# Patient Record
Sex: Male | Born: 1955 | Race: White | Hispanic: No | Marital: Married | Smoking: Never smoker
Health system: Southern US, Community
[De-identification: ages and names within clinical notes are randomized; demographics above are authoritative.]

## PROBLEM LIST (undated history)

## (undated) DIAGNOSIS — E785 Hyperlipidemia, unspecified: Secondary | ICD-10-CM

## (undated) DIAGNOSIS — R011 Cardiac murmur, unspecified: Secondary | ICD-10-CM

## (undated) DIAGNOSIS — E119 Type 2 diabetes mellitus without complications: Secondary | ICD-10-CM

## (undated) DIAGNOSIS — I10 Essential (primary) hypertension: Secondary | ICD-10-CM

## (undated) DIAGNOSIS — H9319 Tinnitus, unspecified ear: Secondary | ICD-10-CM

## (undated) DIAGNOSIS — G43909 Migraine, unspecified, not intractable, without status migrainosus: Secondary | ICD-10-CM

## (undated) DIAGNOSIS — H269 Unspecified cataract: Secondary | ICD-10-CM

## (undated) DIAGNOSIS — J189 Pneumonia, unspecified organism: Secondary | ICD-10-CM

## (undated) DIAGNOSIS — B029 Zoster without complications: Secondary | ICD-10-CM

## (undated) DIAGNOSIS — I2510 Atherosclerotic heart disease of native coronary artery without angina pectoris: Secondary | ICD-10-CM

## (undated) DIAGNOSIS — R51 Headache: Secondary | ICD-10-CM

## (undated) DIAGNOSIS — M1990 Unspecified osteoarthritis, unspecified site: Secondary | ICD-10-CM

## (undated) DIAGNOSIS — K859 Acute pancreatitis without necrosis or infection, unspecified: Principal | ICD-10-CM

## (undated) DIAGNOSIS — R7303 Prediabetes: Secondary | ICD-10-CM

## (undated) HISTORY — DX: Hyperlipidemia, unspecified: E78.5

## (undated) HISTORY — DX: Unspecified cataract: H26.9

## (undated) HISTORY — PX: COLONOSCOPY: SHX174

## (undated) HISTORY — DX: Type 2 diabetes mellitus without complications: E11.9

## (undated) HISTORY — DX: Zoster without complications: B02.9

---
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
## 2009-08-01 HISTORY — PX: SHOULDER ARTHROSCOPY W/ ROTATOR CUFF REPAIR: SHX2400

## 2009-09-04 ENCOUNTER — Encounter: Admission: RE | Admit: 2009-09-04 | Discharge: 2009-09-04 | Payer: Self-pay | Admitting: Orthopedic Surgery

## 2009-09-14 ENCOUNTER — Encounter: Admission: RE | Admit: 2009-09-14 | Discharge: 2009-09-14 | Payer: Self-pay | Admitting: Orthopedic Surgery

## 2014-02-03 ENCOUNTER — Encounter (HOSPITAL_COMMUNITY): Payer: Self-pay | Admitting: Emergency Medicine

## 2014-02-03 ENCOUNTER — Emergency Department (HOSPITAL_COMMUNITY): Payer: Medicare Other

## 2014-02-03 ENCOUNTER — Inpatient Hospital Stay (HOSPITAL_COMMUNITY)
Admission: EM | Admit: 2014-02-03 | Discharge: 2014-02-06 | DRG: 440 | Disposition: A | Discharge status: Home / Self Care | Payer: Medicare Other | Attending: Oncology | Admitting: Oncology

## 2014-02-03 DIAGNOSIS — K859 Acute pancreatitis without necrosis or infection, unspecified: Principal | ICD-10-CM | POA: Diagnosis present

## 2014-02-03 DIAGNOSIS — Z833 Family history of diabetes mellitus: Secondary | ICD-10-CM

## 2014-02-03 DIAGNOSIS — I10 Essential (primary) hypertension: Secondary | ICD-10-CM

## 2014-02-03 DIAGNOSIS — Z8719 Personal history of other diseases of the digestive system: Secondary | ICD-10-CM

## 2014-02-03 HISTORY — DX: Unspecified osteoarthritis, unspecified site: M19.90

## 2014-02-03 HISTORY — DX: Cardiac murmur, unspecified: R01.1

## 2014-02-03 HISTORY — DX: Headache: R51

## 2014-02-03 HISTORY — DX: Migraine, unspecified, not intractable, without status migrainosus: G43.909

## 2014-02-03 HISTORY — DX: Pneumonia, unspecified organism: J18.9

## 2014-02-03 HISTORY — DX: Essential (primary) hypertension: I10

## 2014-02-03 HISTORY — DX: Acute pancreatitis without necrosis or infection, unspecified: K85.90

## 2014-02-03 LAB — I-STAT TROPONIN, ED: TROPONIN I, POC: 0 ng/mL (ref 0.00–0.08)

## 2014-02-03 LAB — TRIGLYCERIDES: TRIGLYCERIDES: 125 mg/dL (ref <150)

## 2014-02-03 LAB — CBC WITH DIFFERENTIAL/PLATELET
BASOS ABS: 0 10*3/uL (ref 0.0–0.1)
Basophils Relative: 0 % (ref 0–1)
EOS ABS: 0.1 10*3/uL (ref 0.0–0.7)
EOS PCT: 0 % (ref 0–5)
HCT: 43.7 % (ref 39.0–52.0)
HEMOGLOBIN: 15.6 g/dL (ref 13.0–17.0)
LYMPHS PCT: 7 % — AB (ref 12–46)
Lymphs Abs: 1.2 10*3/uL (ref 0.7–4.0)
MCH: 29.7 pg (ref 26.0–34.0)
MCHC: 35.7 g/dL (ref 30.0–36.0)
MCV: 83.2 fL (ref 78.0–100.0)
MONO ABS: 1.1 10*3/uL — AB (ref 0.1–1.0)
Monocytes Relative: 6 % (ref 3–12)
Neutro Abs: 15.1 10*3/uL — ABNORMAL HIGH (ref 1.7–7.7)
Neutrophils Relative %: 87 % — ABNORMAL HIGH (ref 43–77)
PLATELETS: 281 10*3/uL (ref 150–400)
RBC: 5.25 MIL/uL (ref 4.22–5.81)
RDW: 12.2 % (ref 11.5–15.5)
WBC: 17.5 10*3/uL — AB (ref 4.0–10.5)

## 2014-02-03 LAB — URINALYSIS, ROUTINE W REFLEX MICROSCOPIC
BILIRUBIN URINE: NEGATIVE
Glucose, UA: NEGATIVE mg/dL
HGB URINE DIPSTICK: NEGATIVE
KETONES UR: NEGATIVE mg/dL
LEUKOCYTES UA: NEGATIVE
NITRITE: NEGATIVE
Protein, ur: NEGATIVE mg/dL
SPECIFIC GRAVITY, URINE: 1.017 (ref 1.005–1.030)
Urobilinogen, UA: 0.2 mg/dL (ref 0.0–1.0)
pH: 6.5 (ref 5.0–8.0)

## 2014-02-03 LAB — LIPASE, BLOOD: Lipase: 102 U/L — ABNORMAL HIGH (ref 11–59)

## 2014-02-03 LAB — COMPREHENSIVE METABOLIC PANEL
ALBUMIN: 4.4 g/dL (ref 3.5–5.2)
ALT: 27 U/L (ref 0–53)
AST: 26 U/L (ref 0–37)
Alkaline Phosphatase: 74 U/L (ref 39–117)
Anion gap: 19 — ABNORMAL HIGH (ref 5–15)
BILIRUBIN TOTAL: 1 mg/dL (ref 0.3–1.2)
BUN: 16 mg/dL (ref 6–23)
CO2: 22 mEq/L (ref 19–32)
Calcium: 9.9 mg/dL (ref 8.4–10.5)
Chloride: 90 mEq/L — ABNORMAL LOW (ref 96–112)
Creatinine, Ser: 0.88 mg/dL (ref 0.50–1.35)
GFR calc Af Amer: >90 mL/min (ref >90)
GFR calc non Af Amer: >90 mL/min (ref >90)
GLUCOSE: 130 mg/dL — AB (ref 70–99)
POTASSIUM: 3.9 meq/L (ref 3.7–5.3)
SODIUM: 131 meq/L — AB (ref 137–147)
Total Protein: 8.6 g/dL — ABNORMAL HIGH (ref 6.0–8.3)

## 2014-02-03 LAB — HIV ANTIBODY (ROUTINE TESTING W REFLEX): HIV 1&2 Ab, 4th Generation: NONREACTIVE

## 2014-02-03 LAB — RAPID URINE DRUG SCREEN, HOSP PERFORMED
Amphetamines: NOT DETECTED
BARBITURATES: NOT DETECTED
BENZODIAZEPINES: NOT DETECTED
COCAINE: NOT DETECTED
Opiates: POSITIVE — AB
Tetrahydrocannabinol: NOT DETECTED

## 2014-02-03 MED ORDER — MORPHINE SULFATE 4 MG/ML IJ SOLN
4.0000 mg | Freq: Once | INTRAMUSCULAR | Status: AC
  Administered 2014-02-03: 4 mg via INTRAVENOUS
  Filled 2014-02-03: qty 1

## 2014-02-03 MED ORDER — SODIUM CHLORIDE 0.9 % IV SOLN
1000.0000 mL | INTRAVENOUS | Status: DC
  Administered 2014-02-03: 1000 mL via INTRAVENOUS

## 2014-02-03 MED ORDER — IOHEXOL 300 MG/ML  SOLN
25.0000 mL | INTRAMUSCULAR | Status: AC
  Administered 2014-02-03: 25 mL via ORAL

## 2014-02-03 MED ORDER — ONDANSETRON HCL 4 MG/2ML IJ SOLN
4.0000 mg | Freq: Four times a day (QID) | INTRAMUSCULAR | Status: DC | PRN
Start: 2014-02-03 — End: 2014-02-06
  Administered 2014-02-03: 4 mg via INTRAVENOUS
  Filled 2014-02-03: qty 2

## 2014-02-03 MED ORDER — ONDANSETRON HCL 4 MG/2ML IJ SOLN
4.0000 mg | Freq: Once | INTRAMUSCULAR | Status: AC
  Administered 2014-02-03: 4 mg via INTRAVENOUS
  Filled 2014-02-03: qty 2

## 2014-02-03 MED ORDER — MORPHINE SULFATE 2 MG/ML IJ SOLN
2.0000 mg | INTRAMUSCULAR | Status: DC | PRN
  Administered 2014-02-03 – 2014-02-04 (×5): 2 mg via INTRAVENOUS
  Filled 2014-02-03 (×5): qty 1

## 2014-02-03 MED ORDER — ONDANSETRON HCL 4 MG PO TABS: 4.0000 mg | ORAL_TABLET | Freq: Four times a day (QID) | ORAL | Status: DC | PRN

## 2014-02-03 MED ORDER — HYDRALAZINE HCL 20 MG/ML IJ SOLN
5.0000 mg | INTRAMUSCULAR | Status: DC | PRN
  Filled 2014-02-03: qty 1

## 2014-02-03 MED ORDER — HEPARIN SODIUM (PORCINE) 5000 UNIT/ML IJ SOLN
5000.0000 [IU] | Freq: Three times a day (TID) | INTRAMUSCULAR | Status: DC
  Administered 2014-02-03 – 2014-02-06 (×9): 5000 [IU] via SUBCUTANEOUS
  Filled 2014-02-03 (×11): qty 1

## 2014-02-03 MED ORDER — SODIUM CHLORIDE 0.9 % IJ SOLN
3.0000 mL | Freq: Two times a day (BID) | INTRAMUSCULAR | Status: DC
Start: 2014-02-03 — End: 2014-02-06
  Administered 2014-02-03: 3 mL via INTRAVENOUS

## 2014-02-03 MED ORDER — SODIUM CHLORIDE 0.9 % IV BOLUS (SEPSIS)
1000.0000 mL | Freq: Once | INTRAVENOUS | Status: AC
  Administered 2014-02-03: 1000 mL via INTRAVENOUS

## 2014-02-03 MED ORDER — SODIUM CHLORIDE 0.9 % IV SOLN
INTRAVENOUS | Status: DC
  Administered 2014-02-04 – 2014-02-05 (×6): via INTRAVENOUS

## 2014-02-03 MED ORDER — IOHEXOL 300 MG/ML  SOLN
80.0000 mL | Freq: Once | INTRAMUSCULAR | Status: AC | PRN
  Administered 2014-02-03: 80 mL via INTRAVENOUS

## 2014-02-03 MED ORDER — SODIUM CHLORIDE 0.9 % IV SOLN
1000.0000 mL | Freq: Once | INTRAVENOUS | Status: AC
  Administered 2014-02-03: 1000 mL via INTRAVENOUS

## 2014-02-03 MED ORDER — HYDROMORPHONE HCL PF 1 MG/ML IJ SOLN
1.0000 mg | Freq: Once | INTRAMUSCULAR | Status: AC
  Administered 2014-02-03: 1 mg via INTRAVENOUS
  Filled 2014-02-03: qty 1

## 2014-02-03 NOTE — ED Notes (Signed)
Pt returned from xray

## 2014-02-03 NOTE — H&P (Signed)
Date: 02/03/2014               Patient Name:  David MayerLawrence Cantu MRN: [id number]  DOB: 06/07/1956 Age / Sex: 58 y.o., male   PCP: No Pcp Per Patient         Medical Service: Internal Medicine Teaching Service         Attending Physician: Dr. Levert FeinsteinJames M Granfortuna, MD    First Contact: Dr. Rich Numberarly Rivet, MD Pager: [phone]  Second Contact: Dr. Christen BameNora Sadek Pager: [phone]       After Hours (After 5p/  First Contact Pager: [phone]  weekends / holidays): Second Contact Pager: [phone]   Chief Complaint: Abdominal Pain  History of Present Illness: David Cantu is a 58yo man with PMHx of SBO 1 year ago and untreated HTN who presents with abdominal pain for 2 days. He states his pain is located in the middle, "under the rib cage", and has progressively worsened over the past few days. He describes the pain as a 9/10 in severity on admission, sharp, does not change with position, and has not relationship to eating. He reports sweats, nausea with no vomiting, and constipation. He denies fever, CP, SOB, diarrhea, hematochezia, and melena. He reports his last BM was this AM and was normal.   A CT abdomen was performed in the ER which revealed indistinct margins of the pancreatic head and minimal adjacent peripancreatic infiltration likely due to focal pancreatitis. Lipase was elevated at 101.   Meds: Current Facility-Administered Medications  Medication Dose Route Frequency Provider Last Rate Last Dose  . 0.9 %  sodium chloride infusion  1,000 mL Intravenous Continuous Imagene ShellerSteve Walton, MD 125 mL/hr at 02/03/14 0947 1,000 mL at 02/03/14 0947  . ondansetron (ZOFRAN) tablet 4 mg  4 mg Oral Q6H PRN Christen BameNora Sadek, MD       Or  . ondansetron [facility](ZOFRAN) injection 4 mg  4 mg Intravenous Q6H PRN Christen BameNora Sadek, MD       Current Outpatient Prescriptions  Medication Sig Dispense Refill  . hydrocortisone cream 1 % Apply 1 application topically 2 (two) times daily as needed for itching.      . naproxen sodium (ANAPROX) 220  MG tablet Take 440 mg by mouth daily as needed (for pain).        Allergies: Allergies as of 02/03/2014  . (No Known Allergies)   Past Medical History  Diagnosis Date  . Hypertension    Past Surgical History  Procedure Laterality Date  . Rotator cuff repair     No family history on file. History   Social History  . Marital Status: Married    Spouse Name: N/A    Number of Children: N/A  . Years of Education: N/A   Occupational History  . Not on file.   Social History Main Topics  . Smoking status: Not on file  . Smokeless tobacco: Not on file  . Alcohol Use: Not on file  . Drug Use: Not on file  . Sexual Activity: Not on file   Other Topics Concern  . Not on file   Social History Narrative  . No narrative on file    Review of Systems: General: Denies changes in weight, fatigue, changes in sleep. HEENT: Denies headache, eye pain, ear pain, rhinorrhea, sore throat.  CV: Denies palpitations, orthopnea. Pulm: Denies wheezing, cough.  GI: Denies hematemesis, difficulty swallowing, pain with swallowing. GU: Denies dysuria, frequency, hematuria. Neuro: Reports tingling in right arm occasionally.  Musculoskeletal: Denies muscle  cramps, weakness.    Physical Exam: Blood pressure 169/94, pulse 72, temperature 98.3 F (36.8 C), temperature source Oral, resp. rate 16, SpO2 92.00%. General: pleasant man in NAD, alert, cooperative, laying on stretcher HEENT: [place]/AT, PERRL, EOMI, sclera anicteric, moist mucus membranes Neck: supple, no JVD CV: RRR, normal S1/S2, no m/g/r Pulm: CTA bilaterally, breaths nonlabored GI: BS+, nondistended, mild tenderness in epigastrium, no hepatosplenomegaly, Murphy sign neg Ext: warm, no edema, moves all, healing rash on backside of left knee   Lab results: Basic Metabolic Panel:  Recent Labs  16/10/96 0755  NA 131*  K 3.9  CL 90*  CO2 22  GLUCOSE 130*  BUN 16  CREATININE 0.88  CALCIUM 9.9   Liver Function Tests:  Recent  Labs  02/03/14 0755  AST 26  ALT 27  ALKPHOS 74  BILITOT 1.0  PROT 8.6*  ALBUMIN 4.4    Recent Labs  02/03/14 0755  LIPASE 102*   No results found for this basename: AMMONIA,  in the last 72 hours CBC:  Recent Labs  02/03/14 0755  WBC 17.5*  NEUTROABS 15.1*  HGB 15.6  HCT 43.7  MCV 83.2  PLT 281   Cardiac Enzymes: No results found for this basename: CKTOTAL, CKMB, CKMBINDEX, TROPONINI,  in the last 72 hours BNP: No results found for this basename: PROBNP,  in the last 72 hours D-Dimer: No results found for this basename: DDIMER,  in the last 72 hours CBG: No results found for this basename: GLUCAP,  in the last 72 hours Hemoglobin A1C: No results found for this basename: HGBA1C,  in the last 72 hours Fasting Lipid Panel: No results found for this basename: CHOL, HDL, LDLCALC, TRIG, CHOLHDL, LDLDIRECT,  in the last 72 hours Thyroid Function Tests: No results found for this basename: TSH, T4TOTAL, FREET4, T3FREE, THYROIDAB,  in the last 72 hours Anemia Panel: No results found for this basename: VITAMINB12, FOLATE, FERRITIN, TIBC, IRON, RETICCTPCT,  in the last 72 hours Coagulation: No results found for this basename: LABPROT, INR,  in the last 72 hours Urine Drug Screen: Drugs of Abuse     Component Value Date/Time   LABOPIA POSITIVE* 02/03/2014 0905   COCAINSCRNUR NONE DETECTED 02/03/2014 0905   LABBENZ NONE DETECTED 02/03/2014 0905   AMPHETMU NONE DETECTED 02/03/2014 0905   THCU NONE DETECTED 02/03/2014 0905   LABBARB NONE DETECTED 02/03/2014 0905    Alcohol Level: No results found for this basename: ETH,  in the last 72 hours Urinalysis:  Recent Labs  02/03/14 0905  COLORURINE YELLOW  LABSPEC 1.017  PHURINE 6.5  GLUCOSEU NEGATIVE  HGBUR NEGATIVE  BILIRUBINUR NEGATIVE  KETONESUR NEGATIVE  PROTEINUR NEGATIVE  UROBILINOGEN 0.2  NITRITE NEGATIVE  LEUKOCYTESUR NEGATIVE    Imaging results:  Ct Abdomen Pelvis W Contrast  02/03/2014   CLINICAL DATA:   Fatigue in for 2 days, mid sternal chest pain, abdominal pain including stabbing pain in lower quadrants, right-side tenderness, nausea, dry heaves, history hypertension  EXAM: CT ABDOMEN AND PELVIS WITH CONTRAST  TECHNIQUE: Multidetector CT imaging of the abdomen and pelvis was performed using the standard protocol following bolus administration of intravenous contrast. Sagittal and coronal MPR images reconstructed from axial data set.  CONTRAST:  80mL OMNIPAQUE IOHEXOL 300 MG/ML SOLN IV. Dilute oral contrast.  COMPARISON:  None  FINDINGS: Dependent atelectasis at both lung bases.  Mild fatty infiltration of liver.  Probable cyst at lateral segment LEFT lobe liver 15 x 18 mm image 13.  Remainder of liver,  spleen, kidneys, and adrenal glands normal appearance.  Mildly heterogeneous appearance of the pancreatic head with indistinct margins and minimal adjacent peripancreatic infiltration favoring focal pancreatitis.  Remainder of pancreas normal in appearance without calcification, mass or ductal dilatation.  No pancreatitis associated fluid collections.  Normal appendix.  Stomach and bowel loops normal appearance.  No mass, adenopathy, free fluid or if free air.  Unremarkable bladder in ureters.  No hernia or acute bone lesions.  IMPRESSION: Suspected acute focal pancreatitis involving the pancreatic head as above.  Mild fatty infiltration of liver with probable small cyst at lateral segment LEFT lobe 15 x 18 mm.   Electronically Signed   By: Ulyses SouthwardMark  Boles M.D.   On: 02/03/2014 11:31    Other results: EKG: normal EKG, normal sinus rhythm, there are no previous tracings available for comparison.  Assessment & Plan: David Cantu is a 58yo man with PMHx of SBO 1 year ago and untreated HTN who presented with 2 days of abdominal pain most likely due to acute pancreatitis.   1. Acute Pancreatitis: Patient presented with 2 days of progressively worsening abdominal pain and nausea. CT abdomen in ER showed fat  stranding and peripancreatic infiltration consistent with acute focal pancreatitis. Lipase was 101.  Likely 2/2 to recent steroid use (prescribed in last week for rash on back of left knee) vs. Hypertriglyceridemia vs. Alcohol (denies recent alcohol use) vs. Gallstones (denies pain with eating, no Murphy sign).  - NPO - IVF NS - Zofran PRN nausea - f/u triglycerides - Morphine 2mg  Q3H PRN pain  2. HTN: Pt states that he was diagnosed with HTN recently but is not on medication and does not have a PCP. BP 169/94 on admission.  - Hydralazine PRN SBP >180, DBP >110 - Continue to monitor BP  3. Possible diabetes mellitus type 2: Patient is not being followed by PCP. Blood sugar in ER was 130. Patient has family history of diabetes.   - Ordered HbgA1c   Diet: NPO DVT PPx: Heparin [place] Dispo: Disposition is deferred at this time, awaiting improvement of current medical problems. Anticipated discharge in approximately 1-2 day(s).   The patient does not have a current PCP (No Pcp Per Patient) and [facility].  The patient does not have transportation limitations that hinder transportation to clinic appointments.  Signed: Rich Numberarly Rivet, MD 02/03/2014, 2:14 PM

## 2014-02-03 NOTE — ED Notes (Signed)
Pt updated on

## 2014-02-03 NOTE — Progress Notes (Signed)
Called ED for report  

## 2014-02-03 NOTE — ED Notes (Signed)
Apresoline not given at this time. BP 140/71.

## 2014-02-03 NOTE — ED Notes (Signed)
Ct called regarding continue delay; pt updated.

## 2014-02-03 NOTE — ED Provider Notes (Signed)
I saw and evaluated the patient, reviewed the resident's note and I agree with the findings and plan.   EKG Interpretation   Date/Time:  Monday February 03 2014 07:52:07 EDT Ventricular Rate:  87 PR Interval:  108 QRS Duration: 93 QT Interval:  378 QTC Calculation: 455 R Axis:   74 Text Interpretation:  Age not entered, assumed to be  58 years old for  purpose of ECG interpretation Sinus rhythm Short PR interval Confirmed by  Loeta Herst,  DO, Nafisah Runions [phone]([id number]) on 02/03/2014 8:20:44 AM      Pt is a 58 y.o. M with history of hypertension who presents emergency department with 2 days of diffuse abdominal pain that started in his epigastric region, nausea and dry heaving. No fevers, chills, vomiting or diarrhea, bloody stool or melena, dysuria or hematuria. On exam, patient is diffusely tender to palpation without focality, no peritoneal signs. He is nontoxic appearing, hemodynamically stable.  Patient's labs and CT scan show acute pancreatitis. Discussed with patient he reports he would like admission for pain control. Will discuss with medicine.  Layla MawKristen N Hiliary Osorto, DO 02/03/14 1300

## 2014-02-03 NOTE — Progress Notes (Signed)
NURSING PROGRESS NOTE  David Cantu [id number] Admission Data: 02/03/2014 3:38 PM Attending Provider: Levert FeinsteinJames M Granfortuna, MD PCP:No PCP Per Patient Code Status: full   David MayerLawrence Cantu is a 58 y.o. male patient admitted from ED:  -No acute distress noted.  -No complaints of shortness of breath.  -No complaints of chest pain.   Cardiac Monitoring: Box # 17 in place. Cardiac monitor yields:normal sinus rhythm.  Blood pressure 157/81, pulse 77, temperature 98.4 F (36.9 C), temperature source Oral, resp. rate 20, SpO2 94.00%.   IV Fluids:  IV in place, occlusive dsg intact without redness, IV cath hand right, condition patent and no redness normal saline @ 125    Allergies:  Review of patient's allergies indicates no known allergies.  Past Medical History:   has a past medical history of Hypertension and Acute pancreatitis (02/03/2014).  Past Surgical History:   has past surgical history that includes Shoulder arthroscopy w/ rotator cuff repair (Left, 2011).  Social History:     Skin: warm dry intact   Patient/Family orientated to room. Information packet given to patient/family. Admission inpatient armband information verified with patient/family to include name and date of birth and placed on patient arm. Side rails up x 2, fall assessment and education completed with patient/family. Patient/family able to verbalize understanding of risk associated with falls and verbalized understanding to call for assistance before getting out of bed. Call light within reach. Patient/family able to voice and demonstrate understanding of unit orientation instructions.    Will continue to evaluate and treat per MD orders.

## 2014-02-03 NOTE — ED Notes (Signed)
CT called regarding delay; pt informed he is next.

## 2014-02-03 NOTE — ED Notes (Addendum)
2 days feeling fatigue and midsternal chest pain. Pain began to migrate to belly. R side is tender. Pt is having nausea and dry heaves but denies vomiting. Pt reports that he thought he was constipated and took laxative. No diarr. Pt states that he has HTN but does not take meds; mother died this past weekend. Pt reports pain is stabbing diffuse to lower quandrants. Pt is diaphoretic.

## 2014-02-03 NOTE — ED Notes (Signed)
CT notified pt done with contrast. 

## 2014-02-03 NOTE — ED Notes (Signed)
Urinal given and pt asked for sample.

## 2014-02-03 NOTE — ED Provider Notes (Signed)
CSN: [id number]     Arrival date & time 02/03/14  0744 History   First MD Initiated Contact with Patient 02/03/14 [phone]     Chief Complaint  Patient presents with  . Abdominal Pain     (Consider location/radiation/quality/duration/timing/severity/associated sxs/prior Treatment) Patient is a 58 y.o. male presenting with abdominal pain.  Abdominal Pain Pain location:  Generalized Pain quality: cramping   Pain radiates to:  Does not radiate Pain severity:  Severe Onset quality:  Gradual Duration:  4 days Timing:  Constant Progression:  Worsening Chronicity:  New Relieved by:  None tried Associated symptoms: nausea and vomiting   Associated symptoms: no chest pain, no chills, no cough, no dysuria, no fever, no hematemesis, no hematuria, no shortness of breath and no sore throat     Past Medical History  Diagnosis Date  . Hypertension    Past Surgical History  Procedure Laterality Date  . Rotator cuff repair     No family history on file. History  Substance Use Topics  . Smoking status: Not on file  . Smokeless tobacco: Not on file  . Alcohol Use: Not on file    Review of Systems  Constitutional: Negative for fever and chills.  HENT: Negative for sore throat.   Eyes: Negative for pain.  Respiratory: Negative for cough and shortness of breath.   Cardiovascular: Negative for chest pain.  Gastrointestinal: Positive for nausea, vomiting and abdominal pain. Negative for hematemesis.  Genitourinary: Negative for dysuria, hematuria, flank pain, penile swelling and scrotal swelling.  Musculoskeletal: Negative for back pain and neck pain.  Skin: Negative for rash.  Neurological: Negative for seizures and headaches.      Allergies  Review of patient's allergies indicates no known allergies.  Home Medications   Prior to Admission medications   Medication Sig Start Date End Date Taking? Authorizing Provider  hydrocortisone cream 1 % Apply 1 application topically 2 (two)  times daily as needed for itching.   Yes Historical Provider, MD  naproxen sodium (ANAPROX) 220 MG tablet Take 440 mg by mouth daily as needed (for pain).   Yes Historical Provider, MD   BP 157/81  Pulse 71  Temp(Src) 98.3 F (36.8 C) (Oral)  Resp 23  SpO2 91% Physical Exam  Constitutional: He is oriented to person, place, and time. He appears well-developed and well-nourished. No distress.  HENT:  Head: Normocephalic and atraumatic.  Eyes: Pupils are equal, round, and reactive to light.  Neck: Normal range of motion.  Cardiovascular: Normal rate and regular rhythm.   Pulmonary/Chest: Effort normal and breath sounds normal.  Abdominal: Soft. He exhibits no distension. There is generalized tenderness. There is no rigidity, no rebound, no guarding, no CVA tenderness, no tenderness at McBurney's point and negative Murphy's sign.  Musculoskeletal: Normal range of motion.  Neurological: He is alert and oriented to person, place, and time.  Skin: Skin is warm. He is not diaphoretic.    ED Course  Procedures (including critical care time) Labs Review Labs Reviewed  CBC WITH DIFFERENTIAL - Abnormal; Notable for the following:    WBC 17.5 (*)    Neutrophils Relative % 87 (*)    Neutro Abs 15.1 (*)    Lymphocytes Relative 7 (*)    Monocytes Absolute 1.1 (*)    All other components within normal limits  COMPREHENSIVE METABOLIC PANEL - Abnormal; Notable for the following:    Sodium 131 (*)    Chloride 90 (*)    Glucose, Bld 130 (*)  Total Protein 8.6 (*)    Anion gap 19 (*)    All other components within normal limits  LIPASE, BLOOD - Abnormal; Notable for the following:    Lipase 102 (*)    All other components within normal limits  URINE RAPID DRUG SCREEN (HOSP PERFORMED) - Abnormal; Notable for the following:    Opiates POSITIVE (*)    All other components within normal limits  URINALYSIS, ROUTINE W REFLEX MICROSCOPIC  TRIGLYCERIDES  HIV ANTIBODY (ROUTINE TESTING)   HEMOGLOBIN A1C  I-STAT TROPOININ, ED    Imaging Review Ct Abdomen Pelvis W Contrast  02/03/2014   CLINICAL DATA:  Fatigue in for 2 days, mid sternal chest pain, abdominal pain including stabbing pain in lower quadrants, right-side tenderness, nausea, dry heaves, history hypertension  EXAM: CT ABDOMEN AND PELVIS WITH CONTRAST  TECHNIQUE: Multidetector CT imaging of the abdomen and pelvis was performed using the standard protocol following bolus administration of intravenous contrast. Sagittal and coronal MPR images reconstructed from axial data set.  CONTRAST:  80mL OMNIPAQUE IOHEXOL 300 MG/ML SOLN IV. Dilute oral contrast.  COMPARISON:  None  FINDINGS: Dependent atelectasis at both lung bases.  Mild fatty infiltration of liver.  Probable cyst at lateral segment LEFT lobe liver 15 x 18 mm image 13.  Remainder of liver, spleen, kidneys, and adrenal glands normal appearance.  Mildly heterogeneous appearance of the pancreatic head with indistinct margins and minimal adjacent peripancreatic infiltration favoring focal pancreatitis.  Remainder of pancreas normal in appearance without calcification, mass or ductal dilatation.  No pancreatitis associated fluid collections.  Normal appendix.  Stomach and bowel loops normal appearance.  No mass, adenopathy, free fluid or if free air.  Unremarkable bladder in ureters.  No hernia or acute bone lesions.  IMPRESSION: Suspected acute focal pancreatitis involving the pancreatic head as above.  Mild fatty infiltration of liver with probable small cyst at lateral segment LEFT lobe 15 x 18 mm.   Electronically Signed   By: Ulyses Southward M.D.   On: 02/03/2014 11:31     EKG Interpretation   Date/Time:  Monday February 03 2014 07:52:07 EDT Ventricular Rate:  87 PR Interval:  108 QRS Duration: 93 QT Interval:  378 QTC Calculation: 455 R Axis:   74 Text Interpretation:  Age not entered, assumed to be  58 years old for  purpose of ECG interpretation Sinus rhythm Short PR  interval Confirmed by  WARD,  DO, KRISTEN [phone]) on 02/03/2014 8:20:44 AM      MDM   Final diagnoses:  Acute pancreatitis, unspecified pancreatitis type   58 yo M with PMHx of HTN who presents with generalized abdominal pain worsening over 2-3 days.  On arrival the patient appears mildly uncomfortable. He has no vital sign abnormalities. Patient has minimal tenderness in his lower abdomen. Patient had some dry heaving while I was in the room. Patient has not been able to throw acting up recently as he is only been drinking water for the last 2 days. Plan is to obtain basic labs and a CT scan of his abdomen. Given that the abdominal pain started in his epigastric area proximally 2-3 days ago the plan is to also obtain a EKG and chest x-ray to rule out any cardiac abnormalities.  EKG as above with no acute abnormalities this time. Troponin negative. CT scan demonstrates pancreatitis consistent with patient's elevated lipase. Patient with improved pain upon administration of IV pain medications here. Discussed with the patient the possibility of home treatment versus  inpatient care. Patient states he is more comfortable coming into the hospital for pain management and observation. I contacted the admission team who agreed to admit the patient for pain control. Patient was admitted to the floor in stable condition. Patient was seen and evaluated by myself and by the attending Dr. Reyes IvanWard    Latese Dufault, MD 02/03/14 1536

## 2014-02-03 NOTE — ED Notes (Signed)
Patient transported to CT without distress 

## 2014-02-04 LAB — COMPREHENSIVE METABOLIC PANEL
ALT: 18 U/L (ref 0–53)
AST: 17 U/L (ref 0–37)
Albumin: 3.4 g/dL — ABNORMAL LOW (ref 3.5–5.2)
Alkaline Phosphatase: 65 U/L (ref 39–117)
Anion gap: 15 (ref 5–15)
BILIRUBIN TOTAL: 0.5 mg/dL (ref 0.3–1.2)
BUN: 12 mg/dL (ref 6–23)
CALCIUM: 8.5 mg/dL (ref 8.4–10.5)
CHLORIDE: 97 meq/L (ref 96–112)
CO2: 22 mEq/L (ref 19–32)
Creatinine, Ser: 0.88 mg/dL (ref 0.50–1.35)
GFR calc Af Amer: >90 mL/min (ref >90)
GFR calc non Af Amer: >90 mL/min (ref >90)
Glucose, Bld: 101 mg/dL — ABNORMAL HIGH (ref 70–99)
Potassium: 4 mEq/L (ref 3.7–5.3)
Sodium: 134 mEq/L — ABNORMAL LOW (ref 137–147)
Total Protein: 7.1 g/dL (ref 6.0–8.3)

## 2014-02-04 LAB — CBC
HCT: 38.5 % — ABNORMAL LOW (ref 39.0–52.0)
HEMOGLOBIN: 13.2 g/dL (ref 13.0–17.0)
MCH: 28.8 pg (ref 26.0–34.0)
MCHC: 34.3 g/dL (ref 30.0–36.0)
MCV: 84.1 fL (ref 78.0–100.0)
Platelets: 245 10*3/uL (ref 150–400)
RBC: 4.58 MIL/uL (ref 4.22–5.81)
RDW: 12.2 % (ref 11.5–15.5)
WBC: 11.3 10*3/uL — AB (ref 4.0–10.5)

## 2014-02-04 LAB — HEMOGLOBIN A1C
HEMOGLOBIN A1C: 5.6 % (ref <5.7)
Mean Plasma Glucose: 114 mg/dL (ref <117)

## 2014-02-04 MED ORDER — PANTOPRAZOLE SODIUM 40 MG IV SOLR
40.0000 mg | INTRAVENOUS | Status: DC
  Administered 2014-02-04 – 2014-02-06 (×3): 40 mg via INTRAVENOUS
  Filled 2014-02-04 (×4): qty 40

## 2014-02-04 NOTE — Progress Notes (Signed)
Subjective: Patient notes that he had some N/Vx1 last night. He complains of abdominal tenderness which is mostly relieved by pain medication.   Objective: Vital signs in last 24 hours: Filed Vitals:   02/03/14 2035 02/04/14 0438 02/04/14 0900 02/04/14 1350  BP: 168/86 166/88  178/83  Pulse: 84 83  80  Temp: 98.9 F (37.2 C) 99.6 F (37.6 C)  98 F (36.7 C)  TempSrc: Oral Oral  Oral  Resp: 20 20  20   Height:   5\' 6"  (1.676 m)   Weight:   180 lb 14.4 oz (82.056 kg)   SpO2: 96% 96%  97%   Weight change:   Intake/Output Summary (Last 24 hours) at 02/04/14 1706 Last data filed at 02/04/14 1701  Gross per 24 hour  Intake 1869.67 ml  Output   2400 ml  Net -530.33 ml   Physical Exam General: pleasant man in NAD, alert, cooperative, laying on bed HEENT: [place]/AT, PERRL, EOMI, sclera anicteric, moist mucus membranes  Neck: supple, no JVD  CV: RRR, normal S1/S2, no m/g/r  Pulm: CTA bilaterally, breaths nonlabored  GI: BS+, nondistended, tenderness in lower abdomen that elicited dry heaving by palpation, no hepatosplenomegaly, Murphy sign neg  Ext: warm, no edema, moves all, healing rash on backside of left knee  Lab Results: Basic Metabolic Panel:  Recent Labs  16/05/9606/06/15 0755 02/04/14 0844  NA 131* 134*  K 3.9 4.0  CL 90* 97  CO2 22 22  GLUCOSE 130* 101*  BUN 16 12  CREATININE 0.88 0.88  CALCIUM 9.9 8.5   Liver Function Tests:  Recent Labs  02/03/14 0755 02/04/14 0844  AST 26 17  ALT 27 18  ALKPHOS 74 65  BILITOT 1.0 0.5  PROT 8.6* 7.1  ALBUMIN 4.4 3.4*    Recent Labs  02/03/14 0755  LIPASE 102*   No results found for this basename: AMMONIA,  in the last 72 hours CBC:  Recent Labs  02/03/14 0755 02/04/14 0844  WBC 17.5* 11.3*  NEUTROABS 15.1*  --   HGB 15.6 13.2  HCT 43.7 38.5*  MCV 83.2 84.1  PLT 281 245   Cardiac Enzymes: No results found for this basename: CKTOTAL, CKMB, CKMBINDEX, TROPONINI,  in the last 72 hours BNP: No results found  for this basename: PROBNP,  in the last 72 hours D-Dimer: No results found for this basename: DDIMER,  in the last 72 hours CBG: No results found for this basename: GLUCAP,  in the last 72 hours Hemoglobin A1C:  Recent Labs  02/03/14 1402  HGBA1C 5.6   Fasting Lipid Panel:  Recent Labs  02/03/14 1402  TRIG 125   Thyroid Function Tests: No results found for this basename: TSH, T4TOTAL, FREET4, T3FREE, THYROIDAB,  in the last 72 hours Anemia Panel: No results found for this basename: VITAMINB12, FOLATE, FERRITIN, TIBC, IRON, RETICCTPCT,  in the last 72 hours Coagulation: No results found for this basename: LABPROT, INR,  in the last 72 hours Urine Drug Screen: Drugs of Abuse     Component Value Date/Time   LABOPIA POSITIVE* 02/03/2014 0905   COCAINSCRNUR NONE DETECTED 02/03/2014 0905   LABBENZ NONE DETECTED 02/03/2014 0905   AMPHETMU NONE DETECTED 02/03/2014 0905   THCU NONE DETECTED 02/03/2014 0905   LABBARB NONE DETECTED 02/03/2014 0905    Alcohol Level: No results found for this basename: ETH,  in the last 72 hours Urinalysis:  Recent Labs  02/03/14 0905  COLORURINE YELLOW  LABSPEC 1.017  PHURINE 6.5  GLUCOSEU NEGATIVE  HGBUR NEGATIVE  BILIRUBINUR NEGATIVE  KETONESUR NEGATIVE  PROTEINUR NEGATIVE  UROBILINOGEN 0.2  NITRITE NEGATIVE  LEUKOCYTESUR NEGATIVE    Micro Results: No results found for this or any previous visit (from the past 240 hour(s)). Studies/Results: Ct Abdomen Pelvis W Contrast  02/03/2014   CLINICAL DATA:  Fatigue in for 2 days, mid sternal chest pain, abdominal pain including stabbing pain in lower quadrants, right-side tenderness, nausea, dry heaves, history hypertension  EXAM: CT ABDOMEN AND PELVIS WITH CONTRAST  TECHNIQUE: Multidetector CT imaging of the abdomen and pelvis was performed using the standard protocol following bolus administration of intravenous contrast. Sagittal and coronal MPR images reconstructed from axial data set.  CONTRAST:   80mL OMNIPAQUE IOHEXOL 300 MG/ML SOLN IV. Dilute oral contrast.  COMPARISON:  None  FINDINGS: Dependent atelectasis at both lung bases.  Mild fatty infiltration of liver.  Probable cyst at lateral segment LEFT lobe liver 15 x 18 mm image 13.  Remainder of liver, spleen, kidneys, and adrenal glands normal appearance.  Mildly heterogeneous appearance of the pancreatic head with indistinct margins and minimal adjacent peripancreatic infiltration favoring focal pancreatitis.  Remainder of pancreas normal in appearance without calcification, mass or ductal dilatation.  No pancreatitis associated fluid collections.  Normal appendix.  Stomach and bowel loops normal appearance.  No mass, adenopathy, free fluid or if free air.  Unremarkable bladder in ureters.  No hernia or acute bone lesions.  IMPRESSION: Suspected acute focal pancreatitis involving the pancreatic head as above.  Mild fatty infiltration of liver with probable small cyst at lateral segment LEFT lobe 15 x 18 mm.   Electronically Signed   By: David SouthwardMark  Cantu M.D.   On: 02/03/2014 11:31   Medications: I have reviewed the patient's current medications. Scheduled Meds: . heparin  5,000 Units Subcutaneous 3 times per day  . pantoprazole (PROTONIX) IV  40 mg Intravenous Q24H  . sodium chloride  3 mL Intravenous Q12H   Continuous Infusions: . sodium chloride 125 mL/hr at 02/04/14 1024   PRN Meds:.hydrALAZINE, morphine injection, ondansetron (ZOFRAN) IV, ondansetron Assessment/Plan: David Cantu is a 58yo man with PMHx of SBO 1 year ago and untreated HTN who presented with 2 days of abdominal pain most likely due to acute pancreatitis.   1. Acute Pancreatitis: Patient presented with 2 days of progressively worsening abdominal pain and nausea. CT abdomen in ER showed fat stranding and peripancreatic infiltration consistent with acute focal pancreatitis. Lipase was 101. Likely 2/2 to recent steroid use (prescribed in last week for rash on back of left knee)  vs. Hypertriglyceridemia (less likely, triglycerides 125) vs. Alcohol (denies recent alcohol use) vs. Gallstones (denies pain with eating, no Murphy sign).  - Keep NPO since had dry heaving on exam this AM -  Continue IVF NS  - Zofran PRN nausea   - Morphine 2mg  Q3H PRN pain   2. HTN: Pt states that he was diagnosed with HTN recently but is not on medication and does not have a PCP. BP 169/94 on admission.  - Hydralazine PRN SBP >180, DBP >110  - Continue to monitor BP  - Will place on PO antihypertensives once pancreatitis stabilized  3. Possible diabetes mellitus type 2: Patient is not being followed by PCP. Blood sugar in ER was 130. Patient has family history of diabetes. HbA1c 5.6. Patient prediabetic.  - Counsel patient about diet and exercise, at risk of developing diabetes  Diet: NPO  DVT PPx: Heparin Tabiona  Dispo: Disposition is deferred  at this time, awaiting improvement of current medical problems. Anticipated discharge in approximately 1-2 day(s).     The patient does not have a current PCP (No Pcp Per Patient) and does need an [facility] hospital follow-up appointment after discharge.  The patient does not have transportation limitations that hinder transportation to clinic appointments.  .Services Needed at time of discharge: Y = Yes, Blank = No PT:   OT:   RN:   Equipment:   Other:     LOS: 1 day   Rich Number, MD 02/04/2014, 5:06 PM

## 2014-02-04 NOTE — Progress Notes (Signed)
Subjective: [name] is a 58yo male on hospital day 2 with a past medical history of poorly controlled hypertension who was admitted to the ED last night for acute abdominal pain. He has a four day history of abdominal pain, nausea, vomiting, and dry heaving.   Overnight he had continued discomfort in his abdomen and nausea. His abdominal pain has extended further down.  He has been NPO and looks forward to eating and consuming liquids again.   He has also continued to have elevated blood pressures throughout the course of his stay.  Objective: Vital signs in last 24 hours: Filed Vitals:   02/03/14 2035 02/04/14 0438 02/04/14 0900 02/04/14 1350  BP: 168/86 166/88  178/83  Pulse: 84 83  80  Temp: 98.9 F (37.2 C) 99.6 F (37.6 C)  98 F (36.7 C)  TempSrc: Oral Oral  Oral  Resp: 20 20  20   Height:   5\' 6"  (1.676 m)   Weight:   82.056 kg (180 lb 14.4 oz)   SpO2: 96% 96%  97%   Weight change:   Intake/Output Summary (Last 24 hours) at 02/04/14 1426 Last data filed at 02/04/14 0906  Gross per 24 hour  Intake      3 ml  Output   2400 ml  Net  -2397 ml   General: resting in bed Cardiac: RRR Pulm: clear to auscultation bilaterally, moving normal volumes of air Abd: tender to palpation, soft, nondistended, BS present Ext: warm and well perfused, no pedal edema  Lab Results:  Basic Metabolic Panel:  Recent Labs  16/05/9606/06/15 0755 02/04/14 0844  NA 131* 134*  K 3.9 4.0  CL 90* 97  CO2 22 22  GLUCOSE 130* 101*  BUN 16 12  CREATININE 0.88 0.88  CALCIUM 9.9 8.5   Liver Function Tests:  Recent Labs  02/03/14 0755 02/04/14 0844  AST 26 17  ALT 27 18  ALKPHOS 74 65  BILITOT 1.0 0.5  PROT 8.6* 7.1  ALBUMIN 4.4 3.4*    Recent Labs  02/03/14 0755  LIPASE 102*   No results found for this basename: AMMONIA,  in the last 72 hours CBC:  Recent Labs  02/03/14 0755 02/04/14 0844  WBC 17.5* 11.3*  NEUTROABS 15.1*  --   HGB 15.6 13.2  HCT 43.7 38.5*  MCV  83.2 84.1  PLT 281 245   Cardiac Enzymes: No results found for this basename: CKTOTAL, CKMB, CKMBINDEX, TROPONINI,  in the last 72 hours BNP: No results found for this basename: PROBNP,  in the last 72 hours D-Dimer: No results found for this basename: DDIMER,  in the last 72 hours CBG: No results found for this basename: GLUCAP,  in the last 72 hours Hemoglobin A1C:  Recent Labs  02/03/14 1402  HGBA1C 5.6   Fasting Lipid Panel:  Recent Labs  02/03/14 1402  TRIG 125   Thyroid Function Tests: No results found for this basename: TSH, T4TOTAL, FREET4, T3FREE, THYROIDAB,  in the last 72 hours Anemia Panel: No results found for this basename: VITAMINB12, FOLATE, FERRITIN, TIBC, IRON, RETICCTPCT,  in the last 72 hours Coagulation: No results found for this basename: LABPROT, INR,  in the last 72 hours Urine Drug Screen: Drugs of Abuse     Component Value Date/Time   LABOPIA POSITIVE* 02/03/2014 0905   COCAINSCRNUR NONE DETECTED 02/03/2014 0905   LABBENZ NONE DETECTED 02/03/2014 0905   AMPHETMU NONE DETECTED 02/03/2014 0905   THCU NONE DETECTED 02/03/2014 0905  LABBARB NONE DETECTED 02/03/2014 0905    Alcohol Level: No results found for this basename: ETH,  in the last 72 hours Urinalysis:  Recent Labs  02/03/14 0905  COLORURINE YELLOW  LABSPEC 1.017  PHURINE 6.5  GLUCOSEU NEGATIVE  HGBUR NEGATIVE  BILIRUBINUR NEGATIVE  KETONESUR NEGATIVE  PROTEINUR NEGATIVE  UROBILINOGEN 0.2  NITRITE NEGATIVE  LEUKOCYTESUR NEGATIVE   Misc. Labs:  Micro Results: No results found for this or any previous visit (from the past 240 hour(s)). Studies/Results: Ct Abdomen Pelvis W Contrast  02/03/2014   CLINICAL DATA:  Fatigue in for 2 days, mid sternal chest pain, abdominal pain including stabbing pain in lower quadrants, right-side tenderness, nausea, dry heaves, history hypertension  EXAM: CT ABDOMEN AND PELVIS WITH CONTRAST  TECHNIQUE: Multidetector CT imaging of the abdomen and pelvis  was performed using the standard protocol following bolus administration of intravenous contrast. Sagittal and coronal MPR images reconstructed from axial data set.  CONTRAST:  80mL OMNIPAQUE IOHEXOL 300 MG/ML SOLN IV. Dilute oral contrast.  COMPARISON:  None  FINDINGS: Dependent atelectasis at both lung bases.  Mild fatty infiltration of liver.  Probable cyst at lateral segment LEFT lobe liver 15 x 18 mm image 13.  Remainder of liver, spleen, kidneys, and adrenal glands normal appearance.  Mildly heterogeneous appearance of the pancreatic head with indistinct margins and minimal adjacent peripancreatic infiltration favoring focal pancreatitis.  Remainder of pancreas normal in appearance without calcification, mass or ductal dilatation.  No pancreatitis associated fluid collections.  Normal appendix.  Stomach and bowel loops normal appearance.  No mass, adenopathy, free fluid or if free air.  Unremarkable bladder in ureters.  No hernia or acute bone lesions.  IMPRESSION: Suspected acute focal pancreatitis involving the pancreatic head as above.  Mild fatty infiltration of liver with probable small cyst at lateral segment LEFT lobe 15 x 18 mm.   Electronically Signed   By: Ulyses Southward M.D.   On: 02/03/2014 11:31   Medications: I have reviewed the patient's current medications. Scheduled Meds: . heparin  5,000 Units Subcutaneous 3 times per day  . pantoprazole (PROTONIX) IV  40 mg Intravenous Q24H  . sodium chloride  3 mL Intravenous Q12H   Continuous Infusions: . sodium chloride 125 mL/hr at 02/04/14 1024   PRN Meds:.hydrALAZINE, morphine injection, ondansetron (ZOFRAN) IV, ondansetron Assessment/Plan: Principal Problem:   Acute pancreatitis Active Problems:   HTN (hypertension)  Pancreatitis: [name] abdominal pain, nausea, vomiting, and dry heaving are most likely due to acute pancreatitis. Lipase on admission was 102. CT scan on admission showed suspected acute focal pancreatitis  involving the pancreatic head.  Suspected etiologies include: steroid use, alcohol use, idiopathic. Triglycerides came back at 125, ruling out hypertriglyceridemia as the cause of his pancreatitis. -NPO, will look into moving to clear liquids tomorrow (7/8) -Zofran 4mg  PRN nausea -Morphine 2mg  Q3H PRN pain  -continuous 0.9% sodium chloride infusion IV -Protonix injection 40mg  Q24H IV  Hypertension: -Hydralazine injection 5mg  Q4H PRN IV, SBP>180, DBP>100 -continue to monitor BP  Diet: NPO  DVT PPx: Heparin injection 5,000 units, 3 times per day, subcutaneous  Suspected DM: Hemobiglon A1c was 5.6%, therefore diabetes mellitus type 2 is not likely.  Dispo: Disposition is deferred at this time, awaiting improvement of current medical problems.  Anticipated discharge in approximately 1-2 day(s).   The patient does not have a current PCP (No Pcp Per Patient) and does need an [facility] hospital follow-up appointment after discharge.  The patient does not have transportation limitations  that hinder transportation to clinic appointments.  .Services Needed at time of discharge: Y = Yes, Blank = No PT:   OT:   RN:   Equipment:   Other:     LOS: 1 day   Benjie KarvonenKathryn J Paul, Med Student 02/04/2014, 2:26 PM   I have seen and examined the patient, and reviewed the daily progress note by Conley SimmondsKathryn Paul, MS3 and discussed the care of the patient with them. Please see my progress note from 02/04/14 for further details regarding assessment and plan.    Signed:  Rich Numberarly Karah Caruthers, MD 02/05/2014, 1:13 PM

## 2014-02-04 NOTE — Progress Notes (Signed)
Utilization review completed.  

## 2014-02-04 NOTE — H&P (Signed)
Attending physician admission note: I personally interviewed and examined this patient History, physical exam, problem evaluation and management plan accurate as recorded above by resident physician Dr. Rich Numberarly Rivet.  [age] year old man who moved to [place] 7 years ago. He has not yet established himself with a regular physician. On previous encounters he was told that he has high blood pressure. He now presents with the acute onset of epigastric abdominal pain. He has no prior history of GERD, stomach ulcers, gallbladder disease, hepatitis, pancreatitis. He drinks alcohol on a rare occasion. He recently received oral steroids to treat a rash but did not have any symptoms while he was taking the steroids. He traveled to [place] one month ago but had no exposures and did not get sick on any of the food that he ate there. He drank exclusively bottled water or boiled water.  Laboratory evaluation remarkable for mild leukocytosis, normal liver chemistries, normal triglycerides, and moderate elevation of lipase 101  (11-59). CT scan of the abdomen shows localized mild inflammation in the area of the head of the pancreas. No mass. No adenopathy. Mild fatty changes of the liver. No inflammation or stones in the gallbladder.  Exam: Blood pressure 178/83, pulse 80, temperature 98 F (36.7 C), temperature source Oral, resp. rate 20, height 5\' 6"  (1.676 m), weight 180 lb 14.4 oz (82.056 kg), SpO2 97.00%. Pertinent findings limited to the abdomen. Abdomen is soft, tender in the epigastric region, normal bowel sounds, no palpable mass or organomegaly.  Impression: #1.Acute pancreatitis Possibly related to recent oral steroids used to treat a skin rash. Plan:Bowel rest. Hydration. Analgesics. #2. Essential hypertension Initiate antihypertensive medication when GI situation is stable.

## 2014-02-05 DIAGNOSIS — R7309 Other abnormal glucose: Secondary | ICD-10-CM

## 2014-02-05 MED ORDER — HYDROCHLOROTHIAZIDE 25 MG PO TABS
25.0000 mg | ORAL_TABLET | Freq: Every day | ORAL | Status: DC
  Administered 2014-02-05 – 2014-02-06 (×2): 25 mg via ORAL
  Filled 2014-02-05 (×2): qty 1

## 2014-02-05 MED ORDER — HYDROCODONE-ACETAMINOPHEN 5-325 MG PO TABS
1.0000 | ORAL_TABLET | ORAL | Status: DC | PRN
  Administered 2014-02-05 – 2014-02-06 (×2): 2 via ORAL
  Filled 2014-02-05 (×2): qty 2

## 2014-02-05 NOTE — Progress Notes (Signed)
Subjective: [name] is a 58yo male on hospital day 3 with a past medical history of poorly controlled hypertension who was admitted for acute abdominal pain, nausea, and vomiting.  Overnight he slept comfortably. His nausea is much improved after administration of Zofran yesterday evening.  He also has not had any episodes of vomiting or dry heaving.  His pain is also significantly improved, with only mild lower abdominal tenderness.  He has been NPO and looks forward to eating and consuming liquids again.   He has also continued to have elevated blood pressures throughout the course of his stay.  Objective: Vital signs in last 24 hours: Filed Vitals:   02/04/14 1350 02/04/14 2100 02/05/14 0500 02/05/14 1425  BP: 178/83 172/93 173/82 161/64  Pulse: 80 80 76 74  Temp: 98 F (36.7 C) 98.1 F (36.7 C) 98 F (36.7 C) 98.5 F (36.9 C)  TempSrc: Oral Oral Oral Oral  Resp: 20 18 20 18   Height:      Weight:      SpO2: 97% 95% 94% 97%   Weight change:   Intake/Output Summary (Last 24 hours) at 02/05/14 1626 Last data filed at 02/05/14 0657  Gross per 24 hour  Intake 2366.67 ml  Output   1275 ml  Net 1091.67 ml   General: resting in bed Cardiac: RRR Pulm: clear to auscultation bilaterally, moving normal volumes of air Abd: tender to palpation, soft, nondistended, BS present   Lab Results:  Basic Metabolic Panel:  Recent Labs  16/05/9606/06/15 0755 02/04/14 0844  NA 131* 134*  K 3.9 4.0  CL 90* 97  CO2 22 22  GLUCOSE 130* 101*  BUN 16 12  CREATININE 0.88 0.88  CALCIUM 9.9 8.5   Liver Function Tests:  Recent Labs  02/03/14 0755 02/04/14 0844  AST 26 17  ALT 27 18  ALKPHOS 74 65  BILITOT 1.0 0.5  PROT 8.6* 7.1  ALBUMIN 4.4 3.4*    Recent Labs  02/03/14 0755  LIPASE 102*   No results found for this basename: AMMONIA,  in the last 72 hours CBC:  Recent Labs  02/03/14 0755 02/04/14 0844  WBC 17.5* 11.3*  NEUTROABS 15.1*  --   HGB 15.6 13.2    HCT 43.7 38.5*  MCV 83.2 84.1  PLT 281 245   Cardiac Enzymes: No results found for this basename: CKTOTAL, CKMB, CKMBINDEX, TROPONINI,  in the last 72 hours BNP: No results found for this basename: PROBNP,  in the last 72 hours D-Dimer: No results found for this basename: DDIMER,  in the last 72 hours CBG: No results found for this basename: GLUCAP,  in the last 72 hours Hemoglobin A1C:  Recent Labs  02/03/14 1402  HGBA1C 5.6   Fasting Lipid Panel:  Recent Labs  02/03/14 1402  TRIG 125   Thyroid Function Tests: No results found for this basename: TSH, T4TOTAL, FREET4, T3FREE, THYROIDAB,  in the last 72 hours Anemia Panel: No results found for this basename: VITAMINB12, FOLATE, FERRITIN, TIBC, IRON, RETICCTPCT,  in the last 72 hours Coagulation: No results found for this basename: LABPROT, INR,  in the last 72 hours Urine Drug Screen: Drugs of Abuse     Component Value Date/Time   LABOPIA POSITIVE* 02/03/2014 0905   COCAINSCRNUR NONE DETECTED 02/03/2014 0905   LABBENZ NONE DETECTED 02/03/2014 0905   AMPHETMU NONE DETECTED 02/03/2014 0905   THCU NONE DETECTED 02/03/2014 0905   LABBARB NONE DETECTED 02/03/2014 0905    Alcohol Level:  No results found for this basename: ETH,  in the last 72 hours Urinalysis:  Recent Labs  02/03/14 0905  COLORURINE YELLOW  LABSPEC 1.017  PHURINE 6.5  GLUCOSEU NEGATIVE  HGBUR NEGATIVE  BILIRUBINUR NEGATIVE  KETONESUR NEGATIVE  PROTEINUR NEGATIVE  UROBILINOGEN 0.2  NITRITE NEGATIVE  LEUKOCYTESUR NEGATIVE   Misc. Labs:  Micro Results: No results found for this or any previous visit (from the past 240 hour(s)). Studies/Results: No results found. Medications: I have reviewed the patient's current medications. Scheduled Meds: . heparin  5,000 Units Subcutaneous 3 times per day  . hydrochlorothiazide  25 mg Oral Daily  . pantoprazole (PROTONIX) IV  40 mg Intravenous Q24H  . sodium chloride  3 mL Intravenous Q12H   Continuous  Infusions: . sodium chloride 125 mL/hr at 02/05/14 1109   PRN Meds:.HYDROcodone-acetaminophen, morphine injection, ondansetron (ZOFRAN) IV, ondansetron Assessment/Plan: Principal Problem:   Acute pancreatitis Active Problems:   HTN (hypertension)  Pancreatitis: [name]'s abdominal pain, nausea, vomiting, and dry heaving are most likely due to acute pancreatitis. Lipase on admission was 102. CT scan on admission showed suspected acute focal pancreatitis involving the pancreatic head.  Suspected etiologies include: steroid use or idiopathic. Triglycerides came back at 125, ruling out hypertriglyceridemia as the cause of his pancreatitis. -move to clear liquid diet, consider starting solids tomorrow if well tolerated -Zofran 4mg  PRN nausea -Hydrocodone-Acetaminophen Q4H PRN pain -Morphine 2mg  Q3H PRN pain  -continuous 0.9% sodium chloride infusion IV (consider discontinuation tomorrow if he is eating and drinking) -Protonix injection 40mg  Q24H IV  Hypertension: -Hydrochlorothiazide 25mg  tablet PO once daily -continue to monitor BP  Diet: clear liquids today and consider moving to solids tomorrow  DVT PPx: Heparin injection 5,000 units, 3 times per day, subcutaneous  Pre diabetes: Hemoglobin A1c was 5.6%, also had a fasting glucose of 130 in the ED on admission. Concern about being so close to a pre diabetic state (especially with family history). Patient reports being highly motivated to make lifestyle changes to prevent diabetes. Education was given to both patient and wife today regarding diet and exercise modifications. Both are eager to make changes.  Dispo: Disposition is deferred at this time, awaiting improvement of current medical problems.  Anticipated discharge in approximately 1-2 day(s).   The patient does not have a current PCP (No Pcp Per Patient) and [facility] after discharge.  The patient does not have transportation  limitations that hinder transportation to clinic appointments.  .Services Needed at time of discharge: Y = Yes, Blank = No PT:   OT:   RN:   Equipment:   Other:     LOS: 2 days   Benjie KarvonenKathryn J Paul, Med Student 02/05/2014, 4:26 PM   I have seen and examined the patient, and reviewed the daily progress note by Conley SimmondsKathryn Paul, MS3 and discussed the care of the patient with them. Please see my progress note from 02/05/14 for further details regarding assessment and plan.    Signed:  Rich Numberarly Teyton Pattillo, MD 02/06/2014, 6:50 AM

## 2014-02-05 NOTE — Progress Notes (Addendum)
Patient ID: David MayerLawrence Bains, male   DOB: 04/24/1956, 58 y.o.   MRN: [id number] Attending physician note: I personally examined this patient together with resident physician Dr. Rich Numberarly Rivet and medical student Janalyn RouseKatherine Paul and I concur with assessment and management plan recorded in their notes. The patient is improving rapidly with minimal residual epigastric discomfort. We will advance his diet. Initiate antihypertensive therapy.  Anticipate discharge soon.

## 2014-02-05 NOTE — Progress Notes (Signed)
Subjective: Patient reports he is feeling much better today. Pain is minimal, relieved by pain medication. Denies N/V, dry heaving, abdominal pain. Pt reports good appetite.  Objective: Vital signs in last 24 hours: Filed Vitals:   02/04/14 0900 02/04/14 1350 02/04/14 2100 02/05/14 0500  BP:  178/83 172/93 173/82  Pulse:  80 80 76  Temp:  98 F (36.7 C) 98.1 F (36.7 C) 98 F (36.7 C)  TempSrc:  Oral Oral Oral  Resp:  20 18 20   Height: 5\' 6"  (1.676 m)     Weight: 180 lb 14.4 oz (82.056 kg)     SpO2:  97% 95% 94%   Weight change:   Intake/Output Summary (Last 24 hours) at 02/05/14 1344 Last data filed at 02/05/14 0657  Gross per 24 hour  Intake 2366.67 ml  Output   1275 ml  Net 1091.67 ml   Physical Exam General: pleasant man in NAD, alert, cooperative, laying on bed  HEENT: [place]/AT, PERRL, EOMI, sclera anicteric, moist mucus membranes  Neck: supple, no JVD  CV: RRR, normal S1/S2, no m/g/r  Pulm: CTA bilaterally, breaths nonlabored  GI: BS+, nondistended, minimal tenderness in lower abdomen, no hepatosplenomegaly, Murphy sign neg  Ext: warm, no edema, moves all  Lab Results: Basic Metabolic Panel:  Recent Labs  16/05/9606/06/15 0755 02/04/14 0844  NA 131* 134*  K 3.9 4.0  CL 90* 97  CO2 22 22  GLUCOSE 130* 101*  BUN 16 12  CREATININE 0.88 0.88  CALCIUM 9.9 8.5   Liver Function Tests:  Recent Labs  02/03/14 0755 02/04/14 0844  AST 26 17  ALT 27 18  ALKPHOS 74 65  BILITOT 1.0 0.5  PROT 8.6* 7.1  ALBUMIN 4.4 3.4*    Recent Labs  02/03/14 0755  LIPASE 102*   No results found for this basename: AMMONIA,  in the last 72 hours CBC:  Recent Labs  02/03/14 0755 02/04/14 0844  WBC 17.5* 11.3*  NEUTROABS 15.1*  --   HGB 15.6 13.2  HCT 43.7 38.5*  MCV 83.2 84.1  PLT 281 245   Cardiac Enzymes: No results found for this basename: CKTOTAL, CKMB, CKMBINDEX, TROPONINI,  in the last 72 hours BNP: No results found for this basename: PROBNP,  in the last  72 hours D-Dimer: No results found for this basename: DDIMER,  in the last 72 hours CBG: No results found for this basename: GLUCAP,  in the last 72 hours Hemoglobin A1C:  Recent Labs  02/03/14 1402  HGBA1C 5.6   Fasting Lipid Panel:  Recent Labs  02/03/14 1402  TRIG 125   Thyroid Function Tests: No results found for this basename: TSH, T4TOTAL, FREET4, T3FREE, THYROIDAB,  in the last 72 hours Anemia Panel: No results found for this basename: VITAMINB12, FOLATE, FERRITIN, TIBC, IRON, RETICCTPCT,  in the last 72 hours Coagulation: No results found for this basename: LABPROT, INR,  in the last 72 hours Urine Drug Screen: Drugs of Abuse     Component Value Date/Time   LABOPIA POSITIVE* 02/03/2014 0905   COCAINSCRNUR NONE DETECTED 02/03/2014 0905   LABBENZ NONE DETECTED 02/03/2014 0905   AMPHETMU NONE DETECTED 02/03/2014 0905   THCU NONE DETECTED 02/03/2014 0905   LABBARB NONE DETECTED 02/03/2014 0905    Alcohol Level: No results found for this basename: ETH,  in the last 72 hours Urinalysis:  Recent Labs  02/03/14 0905  COLORURINE YELLOW  LABSPEC 1.017  PHURINE 6.5  GLUCOSEU NEGATIVE  HGBUR NEGATIVE  BILIRUBINUR NEGATIVE  KETONESUR NEGATIVE  PROTEINUR NEGATIVE  UROBILINOGEN 0.2  NITRITE NEGATIVE  LEUKOCYTESUR NEGATIVE    Micro Results: No results found for this or any previous visit (from the past 240 hour(s)). Studies/Results: No results found. Medications: I have reviewed the patient's current medications. Scheduled Meds: . heparin  5,000 Units Subcutaneous 3 times per day  . hydrochlorothiazide  25 mg Oral Daily  . pantoprazole (PROTONIX) IV  40 mg Intravenous Q24H  . sodium chloride  3 mL Intravenous Q12H   Continuous Infusions: . sodium chloride 125 mL/hr at 02/05/14 1109   PRN Meds:.hydrALAZINE, morphine injection, ondansetron (ZOFRAN) IV, ondansetron Assessment/Plan: [name] is a 58yo man with PMHx of SBO 1 year ago and untreated HTN who presented  with 2 days of abdominal pain most likely due to acute pancreatitis.   1. Acute Pancreatitis: Patient presented with 2 days of progressively worsening abdominal pain and nausea. CT abdomen in ER showed fat stranding and peripancreatic infiltration consistent with acute focal pancreatitis. Lipase was 101. Likely 2/2 to recent steroid use (prescribed in last week for rash on back of left knee) vs. Hypertriglyceridemia (less likely, triglycerides 125) vs. Alcohol (denies recent alcohol use) vs. Gallstones (denies pain with eating, no Murphy sign).  - Advance to clear liquids  - Continue IVF NS  - Zofran PRN nausea  - Morphine 2mg  Q3H PRN pain   2. HTN: Pt states that he was diagnosed with HTN recently but is not on medication and does not have a PCP. BP 169/94 on admission, has been in 170s/90s. - Patient started on HCTZ 25mg  PO QD - Hydralazine PRN SBP >170, DBP >110 - Continue to monitor BP   3. Prediabetes: Patient is not being followed by PCP. Blood sugar in ER was 130. Patient has family history of diabetes. HbA1c 5.6. Patient prediabetic.  - Patient counseled about diet and exercise, at risk of developing diabetes   Diet: Clear liquids DVT PPx: Heparin [place]  Dispo: Disposition is deferred at this time, awaiting improvement of current medical problems. Anticipated discharge in approximately 1-2 day(s).    The patient does not have a current PCP (No Pcp Per Patient) and [facility].  The patient does not have transportation limitations that hinder transportation to clinic appointments.  .Services Needed at time of discharge: Y = Yes, Blank = No PT:   OT:   RN:   Equipment:   Other:     LOS: 2 days   Rich Numberarly Abelino Tippin, MD 02/05/2014, 1:44 PM

## 2014-02-06 LAB — BASIC METABOLIC PANEL
Anion gap: 16 — ABNORMAL HIGH (ref 5–15)
BUN: 13 mg/dL (ref 6–23)
CHLORIDE: 95 meq/L — AB (ref 96–112)
CO2: 23 meq/L (ref 19–32)
CREATININE: 1 mg/dL (ref 0.50–1.35)
Calcium: 8.9 mg/dL (ref 8.4–10.5)
GFR calc Af Amer: >90 mL/min (ref >90)
GFR calc non Af Amer: 80 mL/min — ABNORMAL LOW (ref >90)
GLUCOSE: 93 mg/dL (ref 70–99)
POTASSIUM: 3.6 meq/L — AB (ref 3.7–5.3)
Sodium: 134 mEq/L — ABNORMAL LOW (ref 137–147)

## 2014-02-06 MED ORDER — HYDROCHLOROTHIAZIDE 25 MG PO TABS: 25.0000 mg | ORAL_TABLET | Freq: Every day | ORAL | Status: DC

## 2014-02-06 MED ORDER — PANTOPRAZOLE SODIUM 40 MG PO TBEC
40.0000 mg | DELAYED_RELEASE_TABLET | Freq: Every day | ORAL | Status: DC
Start: 2014-02-06 — End: 2014-02-06
  Administered 2014-02-06: 40 mg via ORAL
  Filled 2014-02-06: qty 1

## 2014-02-06 MED ORDER — HYDROCODONE-ACETAMINOPHEN 5-325 MG PO TABS: 1.0000 | ORAL_TABLET | ORAL | Status: DC | PRN

## 2014-02-06 NOTE — Progress Notes (Signed)
Subjective: Patient reports he is feeling much better. He is tolerating clear liquids. He complains of mild back pain that is worse with lying down but better with walking. He denies fever, chills, abdominal pain, N/V.   Objective: Vital signs in last 24 hours: Filed Vitals:   02/05/14 0500 02/05/14 1425 02/05/14 2024 02/06/14 0502  BP: 173/82 161/64 176/91 156/89  Pulse: 76 74 78 75  Temp: 98 F (36.7 C) 98.5 F (36.9 C) 98.6 F (37 C) 98.2 F (36.8 C)  TempSrc: Oral Oral Oral Oral  Resp: 20 18 20 18   Height:      Weight:      SpO2: 94% 97% 98% 100%   Weight change:   Intake/Output Summary (Last 24 hours) at 02/06/14 1402 Last data filed at 02/06/14 0617  Gross per 24 hour  Intake 2881.67 ml  Output      0 ml  Net 2881.67 ml   Physical Exam General: pleasant man in NAD, alert, cooperative HEENT: [place]/AT, PERRL, EOMI, sclera anicteric, moist mucus membranes  Neck: supple, no JVD  CV: RRR, normal S1/S2, no m/g/r  Pulm: CTA bilaterally, breaths nonlabored  Abd: BS+, nondistended, nontender, no hepatosplenomegaly, Murphy sign neg, no costovertebral angle tenderness. Ext: warm, no edema, moves all  Lab Results: Basic Metabolic Panel:  Recent Labs  16/10/96 0844 02/06/14 0035  NA 134* 134*  K 4.0 3.6*  CL 97 95*  CO2 22 23  GLUCOSE 101* 93  BUN 12 13  CREATININE 0.88 1.00  CALCIUM 8.5 8.9   Liver Function Tests:  Recent Labs  02/04/14 0844  AST 17  ALT 18  ALKPHOS 65  BILITOT 0.5  PROT 7.1  ALBUMIN 3.4*   No results found for this basename: LIPASE, AMYLASE,  in the last 72 hours No results found for this basename: AMMONIA,  in the last 72 hours CBC:  Recent Labs  02/04/14 0844  WBC 11.3*  HGB 13.2  HCT 38.5*  MCV 84.1  PLT 245   Cardiac Enzymes: No results found for this basename: CKTOTAL, CKMB, CKMBINDEX, TROPONINI,  in the last 72 hours BNP: No results found for this basename: PROBNP,  in the last 72 hours D-Dimer: No results found  for this basename: DDIMER,  in the last 72 hours CBG: No results found for this basename: GLUCAP,  in the last 72 hours Hemoglobin A1C: No results found for this basename: HGBA1C,  in the last 72 hours Fasting Lipid Panel: No results found for this basename: CHOL, HDL, LDLCALC, TRIG, CHOLHDL, LDLDIRECT,  in the last 72 hours Thyroid Function Tests: No results found for this basename: TSH, T4TOTAL, FREET4, T3FREE, THYROIDAB,  in the last 72 hours Anemia Panel: No results found for this basename: VITAMINB12, FOLATE, FERRITIN, TIBC, IRON, RETICCTPCT,  in the last 72 hours Coagulation: No results found for this basename: LABPROT, INR,  in the last 72 hours Urine Drug Screen: Drugs of Abuse     Component Value Date/Time   LABOPIA POSITIVE* 02/03/2014 0905   COCAINSCRNUR NONE DETECTED 02/03/2014 0905   LABBENZ NONE DETECTED 02/03/2014 0905   AMPHETMU NONE DETECTED 02/03/2014 0905   THCU NONE DETECTED 02/03/2014 0905   LABBARB NONE DETECTED 02/03/2014 0905    Alcohol Level: No results found for this basename: ETH,  in the last 72 hours Urinalysis: No results found for this basename: COLORURINE, APPERANCEUR, LABSPEC, PHURINE, GLUCOSEU, HGBUR, BILIRUBINUR, KETONESUR, PROTEINUR, UROBILINOGEN, NITRITE, LEUKOCYTESUR,  in the last 72 hours   Micro Results: No  results found for this or any previous visit (from the past 240 hour(s)). Studies/Results: No results found. Medications: I have reviewed the patient's current medications. Scheduled Meds: . heparin  5,000 Units Subcutaneous 3 times per day  . hydrochlorothiazide  25 mg Oral Daily  . pantoprazole  40 mg Oral Q1200  . sodium chloride  3 mL Intravenous Q12H   Continuous Infusions:  PRN Meds:.HYDROcodone-acetaminophen, morphine injection, ondansetron (ZOFRAN) IV, ondansetron Assessment/Plan: [name] is a 58yo man with PMHx of SBO 1 year ago and HTN who presented with 2 days of abdominal pain most likely due to acute pancreatitis.   1.  Acute Pancreatitis: Patient presented with 2 days of progressively worsening abdominal pain and nausea. CT abdomen in ER showed fat stranding and peripancreatic infiltration consistent with acute focal pancreatitis. Lipase was 101. Likely 2/2 to recent steroid use (prescribed in last week for rash on back of left knee) vs. Hypertriglyceridemia (less likely, triglycerides 125) vs. Alcohol (denies recent alcohol use) vs. Gallstones (denies pain with eating, no Murphy sign).  - Advance to solids - Continue IVF NS  - Zofran PRN nausea  - Morphine 2mg  Q3H PRN pain   2. HTN: Pt states that he was diagnosed with HTN recently but is not on medication and does not have a PCP. BP 169/94 on admission, now 150s/80s. - Continue HCTZ 25mg  PO QD  - Continue to monitor BP   3. Prediabetes: Patient is not being followed by PCP. Blood sugar in ER was 130. Patient has family history of diabetes. HbA1c 5.6. Patient prediabetic.  - Patient counseled about diet and exercise, at risk of developing diabetes   Diet: Low sodium DVT PPx: Heparin [place]  Dispo: Likely discharge today    The patient does not have a current PCP (No Pcp Per Patient) and [facility].  The patient does not have transportation limitations that hinder transportation to clinic appointments.  .Services Needed at time of discharge: Y = Yes, Blank = No PT:   OT:   RN:   Equipment:   Other:     LOS: 3 days   Rich Numberarly Kan[place] Wanner, MD 02/06/2014, 2:02 PM

## 2014-02-06 NOTE — Progress Notes (Signed)
PT with complaints of IV leaking. Upon assessment of pt's hand, it was discovered that his IV had infiltrated. The pt's IV was removed and a heat back was applied to the site.

## 2014-02-06 NOTE — Discharge Instructions (Signed)
It was a pleasure taking care of you, [name]. You were hospitalized for acute pancreatitis. Please take your new blood pressure medication, Hydrochlorothiazide (HCTZ) 25mg  every day. Also, please make it to your follow up appointment on July 16th @ 1:30PM so that blood work can be done. This is necessary for your new blood pressure medication.

## 2014-02-06 NOTE — Progress Notes (Signed)
Nsg Discharge Note  Admit Date:  02/03/2014 Discharge date: 02/06/2014   David MayerLawrence Cantu to be D/C'd Home per MD order.  AVS completed.  Copy for chart, and copy for patient signed, and dated. Patient/caregiver able to verbalize understanding.  Discharge Medication:   Medication List    STOP taking these medications       hydrocortisone cream 1 %     naproxen sodium 220 MG tablet  Commonly known as:  ANAPROX      TAKE these medications       hydrochlorothiazide 25 MG tablet  Commonly known as:  HYDRODIURIL  Take 1 tablet (25 mg total) by mouth daily.     HYDROcodone-acetaminophen 5-325 MG per tablet  Commonly known as:  NORCO/VICODIN  Take 1-2 tablets by mouth every 4 (four) hours as needed for moderate pain or severe pain.        Discharge Assessment: Filed Vitals:   02/06/14 1434  BP: 121/74  Pulse: 70  Temp: 98.7 F (37.1 C)  Resp: 18   Skin clean, dry and intact without evidence of skin break down, no evidence of skin tears noted. IV catheter discontinued intact. Site without signs and symptoms of complications - no redness or edema noted at insertion site, patient denies c/o pain - only slight tenderness at site.  Dressing with slight pressure applied.  D/c Instructions-Education: Discharge instructions given to patient/family with verbalized understanding. D/c education completed with patient/family including follow up instructions, medication list, d/c activities limitations if indicated, with other d/c instructions as indicated by MD - patient able to verbalize understanding, all questions fully answered. Patient instructed to return to ED, call 911, or call MD for any changes in condition.  Patient escorted via WC, and D/C home via private auto.  David Cantu, David Esperanza L, RN 02/06/2014 4:16 PM

## 2014-02-06 NOTE — Discharge Summary (Signed)
Name: David Cantu MRN: [id number] DOB: January 13, 1956 58 y.o. PCP: No Pcp Per Patient  Date of Admission: 02/03/2014  7:44 AM Date of Discharge: 02/06/2014 Attending Physician: Levert Feinstein, MD  Discharge Diagnosis: 1. Acute Pancreatitis 2. HTN  Discharge Medications:   Medication List    STOP taking these medications       hydrocortisone cream 1 %     naproxen sodium 220 MG tablet  Commonly known as:  ANAPROX      TAKE these medications       hydrochlorothiazide 25 MG tablet  Commonly known as:  HYDRODIURIL  Take 1 tablet (25 mg total) by mouth daily.     HYDROcodone-acetaminophen 5-325 MG per tablet  Commonly known as:  NORCO/VICODIN  Take 1-2 tablets by mouth every 4 (four) hours as needed for moderate pain or severe pain.        Disposition and follow-up:   Mr.David Cantu was discharged from [facility] in Good condition.  At the hospital follow up visit please address:  1.  HTN. He was started on HCTZ 25mg  QD during his hospitalization for acute pancreatitis.   2.  Labs / imaging needed at time of follow-up: BMET  3.  Pending labs/ test needing follow-up: None  Follow-up Appointments: Follow-up Information   Follow up with Evelena Peat, DO. (July 16th @ 1:30PM. )    Specialty:  Internal Medicine   Contact information:   [address] [place] [address] [phone]       Follow up with Rich Number, MD. (July 28th @ 2:15PM)    Specialty:  Internal Medicine   Contact information:   [address] [place] [address] [phone]       Discharge Instructions: Discharge Instructions   Diet - low sodium heart healthy    Complete by:  As directed      Discharge instructions    Complete by:  As directed   Please continue to take Hydrochlorothiazide 25mg  every day for your blood pressure.     Increase activity slowly    Complete by:  As directed            Consultations:    Procedures Performed:  Ct Abdomen  Pelvis W Contrast  02/03/2014   CLINICAL DATA:  Fatigue in for 2 days, mid sternal chest pain, abdominal pain including stabbing pain in lower quadrants, right-side tenderness, nausea, dry heaves, history hypertension  EXAM: CT ABDOMEN AND PELVIS WITH CONTRAST  TECHNIQUE: Multidetector CT imaging of the abdomen and pelvis was performed using the standard protocol following bolus administration of intravenous contrast. Sagittal and coronal MPR images reconstructed from axial data set.  CONTRAST:  80mL OMNIPAQUE IOHEXOL 300 MG/ML SOLN IV. Dilute oral contrast.  COMPARISON:  None  FINDINGS: Dependent atelectasis at both lung bases.  Mild fatty infiltration of liver.  Probable cyst at lateral segment LEFT lobe liver 15 x 18 mm image 13.  Remainder of liver, spleen, kidneys, and adrenal glands normal appearance.  Mildly heterogeneous appearance of the pancreatic head with indistinct margins and minimal adjacent peripancreatic infiltration favoring focal pancreatitis.  Remainder of pancreas normal in appearance without calcification, mass or ductal dilatation.  No pancreatitis associated fluid collections.  Normal appendix.  Stomach and bowel loops normal appearance.  No mass, adenopathy, free fluid or if free air.  Unremarkable bladder in ureters.  No hernia or acute bone lesions.  IMPRESSION: Suspected acute focal pancreatitis involving the pancreatic head as above.  Mild fatty infiltration of liver with probable small cyst at lateral segment LEFT lobe 15 x 18 mm.   Electronically Signed   By: Ulyses SouthwardMark  Boles M.D.   On: 02/03/2014 11:31    Admission HPI: [name] is a 58yo man with PMHx of SBO 1 year ago and untreated HTN who presents with abdominal pain for 2 days. He states his pain is located in the middle, "under the rib cage", and has progressively worsened over the past few days. He describes the pain as a 9/10 in severity on admission, sharp, does not change with position, and has not relationship to eating.  He reports sweats, nausea with no vomiting, and constipation. He denies fever, CP, SOB, diarrhea, hematochezia, and melena. He reports his last BM was this AM and was normal.  A CT abdomen was performed in the ER which revealed indistinct margins of the pancreatic head and minimal adjacent peripancreatic infiltration likely due to focal pancreatitis. Lipase was elevated at 101.    Hospital Course by problem list:   1. Acute Pancreatitis: Patient presented with 2 days of progressively worsening abdominal pain and nausea. CT abdomen in ER showed fat stranding and peripancreatic infiltration consistent with acute focal pancreatitis. Lipase was 101. Likely 2/2 to recent steroid use, which was prescribed last week for rash on back of left knee that has now resolved. He was made NPO and given IVF. On 02/05/14 he was advanced to clear liquids, which he tolerated well with no N/V. On 02/06/14 his abdominal pain fully resolved and he tolerated solid food.     2. HTN: Pt states that he was diagnosed with HTN recently but is not on medication and does not have a PCP. BP 169/94 on admission. He was placed on Hydralazine 10mg  PRN SBP >170 and DBP >110. Once he tolerated PO intake, he was started on HCTZ 25mg  PO QD and his BP now in 150s/80s.    Discharge Vitals:   BP 121/74  Pulse 70  Temp(Src) 98.7 F (37.1 C) (Oral)  Resp 18  Ht 5\' 6"  (1.676 m)  Wt 180 lb 14.4 oz (82.056 kg)  BMI 29.21 kg/m2  SpO2 94% General: pleasant man in NAD, alert, cooperative  HEENT: [place]/AT, PERRL, EOMI, sclera anicteric, moist mucus membranes  Neck: supple, no JVD  CV: RRR, normal S1/S2, no m/g/r  Pulm: CTA bilaterally, breaths nonlabored  Abd: BS+, nondistended, nontender, no hepatosplenomegaly, Murphy sign neg, no costovertebral angle tenderness.  Ext: warm, no edema, moves all  Discharge Labs:  Results for orders placed during the hospital encounter of 02/03/14 (from the past 24 hour(s))  BASIC METABOLIC PANEL     Status:  Abnormal   Collection Time    02/06/14 12:35 AM      Result Value Ref Range   Sodium 134 (*) 137 - 147 mEq/L   Potassium 3.6 (*) 3.7 - 5.3 mEq/L   Chloride 95 (*) 96 - 112 mEq/L   CO2 23  19 - 32 mEq/L   Glucose, Bld 93  70 - 99 mg/dL   BUN 13  6 - 23 mg/dL   Creatinine, Ser 1.611.00  0.50 - 1.35 mg/dL   Calcium 8.9  8.4 - 09.610.5 mg/dL   GFR calc non Af Amer 80 (*) >90 mL/min   GFR calc Af Amer >90  >90 mL/min   Anion gap 16 (*) 5 - 15    Signed: Rich Numberarly Maddi Collar, MD 02/06/2014, 2:48 PM    Services Ordered on Discharge: None  Equipment Ordered on Discharge: None

## 2014-02-06 NOTE — Care Management Note (Signed)
    Page 1 of 1   02/06/2014     10:50:49 AM CARE MANAGEMENT NOTE 02/06/2014  Patient:  Verlene MayerGANDOLFO,Nikholas   Account Number:  [id number]  Date Initiated:  02/06/2014  Documentation initiated by:  Letha CapeAYLOR,Savier Trickett  Subjective/Objective Assessment:   dx acute pancreatitis  admit- lives with spouse.     Action/Plan:   Anticipated DC Date:  02/06/2014   Anticipated DC Plan:  HOME/SELF CARE      DC Planning Services  CM consult      Choice offered to / List presented to:             Status of service:  Completed, signed off Medicare Important Message given?  YES (If response is "NO", the following Medicare IM given date fields will be blank) Date Medicare IM given:  02/06/2014 Medicare IM given by:  Letha CapeAYLOR,Mykell Rawl Date Additional Medicare IM given:   Additional Medicare IM given by:    Discharge Disposition:  HOME/SELF CARE  Per UR Regulation:  Reviewed for med. necessity/level of care/duration of stay  If discussed at Long Length of Stay Meetings, dates discussed:    Comments:  02/06/14 1049 Letha Capeeborah Sukanya Goldblatt RN, BSN [phone] Patient is from home, pta indep.  No needs anticipated.

## 2014-02-07 NOTE — Discharge Summary (Signed)
Attending physician discharge note: I personally examined this patient on the day of discharge together with resident physician Dr. Rich Numberarly Rivet. 58 year old man with untreated hypertension who presented with acute abdominal pain with findings of pancreatitis. Symptoms resolved rapidly with conservative treatment. Once abdominal symptoms had subsided and he began to take fluids and nutrition by mouth, he was started on antihypertensive treatment with hydrochlorothiazide.  He will followup in in our internal medicine clinic. There were no complications.

## 2014-02-13 ENCOUNTER — Encounter: Payer: Self-pay | Admitting: Internal Medicine

## 2014-02-13 ENCOUNTER — Ambulatory Visit (INDEPENDENT_AMBULATORY_CARE_PROVIDER_SITE_OTHER): Payer: Medicare Other | Admitting: Internal Medicine

## 2014-02-13 VITALS — BP 144/82 | HR 72 | Temp 98.3°F | Ht 66.0 in | Wt 178.2 lb

## 2014-02-13 DIAGNOSIS — R0989 Other specified symptoms and signs involving the circulatory and respiratory systems: Secondary | ICD-10-CM

## 2014-02-13 DIAGNOSIS — I10 Essential (primary) hypertension: Secondary | ICD-10-CM

## 2014-02-13 DIAGNOSIS — R0600 Dyspnea, unspecified: Secondary | ICD-10-CM

## 2014-02-13 DIAGNOSIS — M545 Low back pain, unspecified: Secondary | ICD-10-CM

## 2014-02-13 DIAGNOSIS — M549 Dorsalgia, unspecified: Secondary | ICD-10-CM | POA: Insufficient documentation

## 2014-02-13 DIAGNOSIS — R0609 Other forms of dyspnea: Secondary | ICD-10-CM

## 2014-02-13 DIAGNOSIS — K859 Acute pancreatitis without necrosis or infection, unspecified: Secondary | ICD-10-CM

## 2014-02-13 MED ORDER — HYDROCHLOROTHIAZIDE 25 MG PO TABS: 25.0000 mg | ORAL_TABLET | Freq: Every day | ORAL | Status: DC

## 2014-02-13 NOTE — Patient Instructions (Signed)
1. Please try ice and anti-inflammatory as needed for your back pain.  If these things do not work for your pain you may need another CT of your abdomen.  Please seek emergency care if you develop worsening symptoms including fever, N/V, or severe back/abdominal pain.   2. Please take all medications as prescribed.    3. If you have worsening of your symptoms or new symptoms arise, please call the clinic [phone] or go to the ER immediately if symptoms are severe.  Please return to see me on Monday, 02/17/14.

## 2014-02-13 NOTE — Progress Notes (Signed)
   Subjective:    Patient ID: David MayerLawrence Cantu, male    DOB: 11/10/1955, 58 y.o.   MRN: [id number]  HPI Comments: David Cantu is a 58 year old male with PMH of HTN (recently dx not previously on trx).  Recently hospitalized for pancreatitis (first episode) thought to be 2/2 to recent steroid use and no EtOH, gallstone, HTG hx.  He is here for hospital follow-up.  Tolerating food, no N/V.  Had low back pain that started in the hospital L greater than R and has persisted at home.  He was given rx for Vicodin which he does not feel has helped so he stopped taking it.  Back pain is 10/10 at worse, constant averaging 7-8/10.  Improves with laying down or standing but does not go away completely.  He has tried heating pad which made it worse.  No loss of bowel or bladder sensation.  No saddle anesthesia.  No obvious trauma to the back.  No dysuria or f/c.  HTN - He was started on HCTZ 25mg  daily during hospitalization.     DOE in the past year, says he was having episodes of PND for past 6 months prior to hospitalization that has resolved.  No prior hx of OSA.  No lower extremity edema.     Review of Systems  Constitutional: Negative for chills and appetite change.  Eyes: Positive for visual disturbance.       Right cataract  Respiratory: Negative for cough and shortness of breath.        Occasional DOE  Cardiovascular: Negative for palpitations and leg swelling.  Gastrointestinal: Negative for nausea, vomiting, diarrhea, constipation and blood in stool.  Neurological: Positive for dizziness. Negative for syncope.       First few days taking HCTZ; has since resolved.       Objective:   Physical Exam  Vitals reviewed. Constitutional: He is oriented to person, place, and time. He appears well-developed. No distress.  HENT:  Head: Normocephalic and atraumatic.  Mouth/Throat: Oropharynx is clear and moist. No oropharyngeal exudate.  Eyes: EOM are normal. Pupils are equal, round, and reactive  to light.  Cardiovascular: Normal rate, regular rhythm, normal heart sounds and intact distal pulses.  Exam reveals no gallop and no friction rub.   No murmur heard. Pulmonary/Chest: Effort normal. No respiratory distress. He has no wheezes. He has no rales.  Abdominal: Soft. Bowel sounds are normal. He exhibits no distension. There is no tenderness.  Musculoskeletal: Normal range of motion. He exhibits tenderness. He exhibits no edema.  Left lumbar region TTP; spine non-tender; negative straight leg raise, but left lumbar pain with raising the right leg.  Lymphadenopathy:    He has no cervical adenopathy.  Neurological: He is alert and oriented to person, place, and time. No cranial nerve deficit.  Normal patellar and achilles reflexes.  Normal strength and sensation of lower extremity.  Skin: Skin is warm. No rash noted. He is not diaphoretic. No erythema.  No rashes noted on the back.  Psychiatric: He has a normal mood and affect. His behavior is normal.          Assessment & Plan:  Please see problem assessment and plan.

## 2014-02-13 NOTE — Assessment & Plan Note (Signed)
He reports occasional dyspnea with exertion (climbing stairs) that started about 1 year ago.  He denies orthopnea.  Reports PND x 6 months which has resolved since discharge.  Denies lower extremity edema, morning headaches or hx of OSA.  Lungs CTA, normal heart sounds, no edema.  Differential includes deconditioning vs OSA vs HF 2/2 to HTN.   - will discuss referral for ECHO when he returns in four days

## 2014-02-13 NOTE — Assessment & Plan Note (Signed)
Back pain x 1 week, started while in hospital for pancreatitis.  Has not resolved upon discharge home.  No improvement with Vicodin.  Feels better when he stands or lays flat.  No red flag symptoms of spinal compression, no S&S of pyelonephritis.  Differential includes MSK back pain vs pancreatitis complication (hematoma, fluid collection, pseudocyst-usually occur late).  He is hemodynamically stable. - suggests anti-inflammatory and ice for back pain  - RTC in four days, if still symptomatic will get CT abdomen to r/o pancreatitis complications - he has been instructed to go to ED if he develops new symptoms including fever, N/V, severe back pain or abdominal pain

## 2014-02-13 NOTE — Assessment & Plan Note (Addendum)
BP Readings from Last 3 Encounters:  02/13/14 144/82  02/06/14 121/74    Lab Results  Component Value Date   NA 134* 02/06/2014   K 3.6* 02/06/2014   CREATININE 1.00 02/06/2014    Assessment: Blood pressure control:  fair Progress toward BP goal:   not at goal  Comments:   Plan: Medications:  continue current medications:  HCTZ  Self management tools provided: home blood pressure logbook Other plans: Today's BP may be slightly elevated 2/2 to back pain. Will re-check when he returns in four days.

## 2014-02-13 NOTE — Assessment & Plan Note (Addendum)
Tolerating po.  No abdominal pain, N/V.  Vitals stable. - RTC to clinic in four days - if still having back pain at return visit will get CT abd to evaluate for pancreatitis complications

## 2014-02-14 LAB — BASIC METABOLIC PANEL WITH GFR
BUN: 18 mg/dL (ref 6–23)
CHLORIDE: 93 meq/L — AB (ref 96–112)
CO2: 31 meq/L (ref 19–32)
Calcium: 9.8 mg/dL (ref 8.4–10.5)
Creat: 1.2 mg/dL (ref 0.50–1.35)
GFR, Est African American: 76 mL/min
GFR, Est Non African American: 66 mL/min
Glucose, Bld: 101 mg/dL — ABNORMAL HIGH (ref 70–99)
POTASSIUM: 4 meq/L (ref 3.5–5.3)
Sodium: 136 mEq/L (ref 135–145)

## 2014-02-14 NOTE — Progress Notes (Signed)
INTERNAL MEDICINE TEACHING ATTENDING ADDENDUM - Vannia Pola, MD: I reviewed and discussed at the time of visit with the resident Dr. Wilson, the patient's medical history, physical examination, diagnosis and results of pertinent tests and treatment and I agree with the patient's care as documented.  

## 2014-02-17 ENCOUNTER — Ambulatory Visit (INDEPENDENT_AMBULATORY_CARE_PROVIDER_SITE_OTHER): Payer: Medicare Other | Admitting: Internal Medicine

## 2014-02-17 ENCOUNTER — Encounter: Payer: Self-pay | Admitting: Internal Medicine

## 2014-02-17 VITALS — BP 143/78 | HR 71 | Temp 97.7°F | Ht 67.0 in | Wt 178.8 lb

## 2014-02-17 DIAGNOSIS — R0600 Dyspnea, unspecified: Secondary | ICD-10-CM

## 2014-02-17 DIAGNOSIS — M545 Low back pain, unspecified: Secondary | ICD-10-CM

## 2014-02-17 DIAGNOSIS — R0609 Other forms of dyspnea: Secondary | ICD-10-CM

## 2014-02-17 DIAGNOSIS — R0989 Other specified symptoms and signs involving the circulatory and respiratory systems: Secondary | ICD-10-CM

## 2014-02-17 DIAGNOSIS — Z0000 Encounter for general adult medical examination without abnormal findings: Secondary | ICD-10-CM

## 2014-02-17 DIAGNOSIS — K859 Acute pancreatitis, unspecified: Secondary | ICD-10-CM

## 2014-02-17 LAB — CBC
HCT: 40.8 % (ref 39.0–52.0)
Hemoglobin: 14 g/dL (ref 13.0–17.0)
MCH: 28.6 pg (ref 26.0–34.0)
MCHC: 34.3 g/dL (ref 30.0–36.0)
MCV: 83.3 fL (ref 78.0–100.0)
PLATELETS: 261 10*3/uL (ref 150–400)
RBC: 4.9 MIL/uL (ref 4.22–5.81)
RDW: 13.4 % (ref 11.5–15.5)
WBC: 6.7 10*3/uL (ref 4.0–10.5)

## 2014-02-17 NOTE — Patient Instructions (Signed)
1. I am checking more lab work today.  I will call if there are abnormalities.     2. Please take all medications as prescribed.  You can continue to take Advil as needed (do not take more than recommended amount on package) for your low back pain.  You can also try ice wrapped in a towel if it helps.      3. If you have worsening of your symptoms or new symptoms arise (including fever, chills, nausea, vomiting, poor appetite, abdominal pain, loss of control of bladder or bowel, dizziness), please call the clinic ([phone]), or go to the ER immediately if symptoms are severe.   Please return to clinic next week on 02/25/2014 for your appointment with Dr. Beckie Salts.   Back Pain, Adult Low back pain is very common. About 1 in 5 people have back pain.The cause of low back pain is rarely dangerous. The pain often gets better over time.About half of people with a sudden onset of back pain feel better in just 2 weeks. About 8 in 10 people feel better by 6 weeks.  CAUSES Some common causes of back pain include:  Strain of the muscles or ligaments supporting the spine.  Wear and tear (degeneration) of the spinal discs.  Arthritis.  Direct injury to the back. DIAGNOSIS Most of the time, the direct cause of low back pain is not known.However, back pain can be treated effectively even when the exact cause of the pain is unknown.Answering your caregiver's questions about your overall health and symptoms is one of the most accurate ways to make sure the cause of your pain is not dangerous. If your caregiver needs more information, he or she may order lab work or imaging tests (X-rays or MRIs).However, even if imaging tests show changes in your back, this usually does not require surgery. HOME CARE INSTRUCTIONS For many people, back pain returns.Since low back pain is rarely dangerous, it is often a condition that people can learn to [facility] their own.   Remain active. It is stressful on the back  to sit or stand in one place. Do not sit, drive, or stand in one place for more than 30 minutes at a time. Take short walks on level surfaces as soon as pain allows.Try to increase the length of time you walk each day.  Do not stay in bed.Resting more than 1 or 2 days can delay your recovery.  Do not avoid exercise or work.Your body is made to move.It is not dangerous to be active, even though your back may hurt.Your back will likely heal faster if you return to being active before your pain is gone.  Pay attention to your body when you bend and lift. Many people have less discomfortwhen lifting if they bend their knees, keep the load close to their bodies,and avoid twisting. Often, the most comfortable positions are those that put less stress on your recovering back.  Find a comfortable position to sleep. Use a firm mattress and lie on your side with your knees slightly bent. If you lie on your back, put a pillow under your knees.  Only take over-the-counter or prescription medicines as directed by your caregiver. Over-the-counter medicines to reduce pain and inflammation are often the most helpful.Your caregiver may prescribe muscle relaxant drugs.These medicines help dull your pain so you can more quickly return to your normal activities and healthy exercise.  Put ice on the injured area.  Put ice in a plastic bag.  Place a  towel between your skin and the bag.  Leave the ice on for 15-20 minutes, 03-04 times a day for the first 2 to 3 days. After that, ice and heat may be alternated to reduce pain and spasms.  Ask your caregiver about trying back exercises and gentle massage. This may be of some benefit.  Avoid feeling anxious or stressed.Stress increases muscle tension and can worsen back pain.It is important to recognize when you are anxious or stressed and learn ways to manage it.Exercise is a great option. SEEK MEDICAL CARE IF:  You have pain that is not relieved with  rest or medicine.  You have pain that does not improve in 1 week.  You have new symptoms.  You are generally not feeling well. SEEK IMMEDIATE MEDICAL CARE IF:   You have pain that radiates from your back into your legs.  You develop new bowel or bladder control problems.  You have unusual weakness or numbness in your arms or legs.  You develop nausea or vomiting.  You develop abdominal pain.  You feel faint. Document Released: 07/18/2005 Document Revised: 01/17/2012 Document Reviewed: 12/06/2010 [facility] Patient Information 2015 [place], [place]. This information is not intended to replace advice given to you by your health care provider. Make sure you discuss any questions you have with your health care provider.

## 2014-02-17 NOTE — Progress Notes (Signed)
   Subjective:    Patient ID: David Cantu, male    DOB: 09/28/1955, 58 y.o.   MRN: [id number]  HPI Comments: David Cantu is a 58 year old male with PMH of HTN (dx in 4720s but never treated until recent admission).  Recently hospitalized for pancreatitis (first episode) thought to be 2/2 to recent steroid use and no EtOH, gallstone, HTG hx.  [facility] and had c/o of low back pain x 1 week that started while admitted for pancreatitis.  He is here today for follow-up of the back pain.  He has been taking prn Aleve and pain is improved from 8/10 to 5/10.  Pain is noted with sitting for long periods.  Feels better to lay down and stand up.  No fever, chills, loss of appetite, abdominal pain or N/V.  Walking with no problem.  No saddle anesthesia or loss of bowel or bladder control.  Not waking up at night in pain.  Pain is responding to Aleve.  No other complaints.  Some DOE with increased activity but says it may occur after carrying a heavy load up the stairs after several trips.  Not dyspneic with usual physical exertion (climbing stairs).  No leg swelling or orthopnea.  He notes several months where he would wake up feeling like something was stuck in his throat and have difficulty getting "air out".  This has resolved since hospitalization.  He snores but no morning headaches.        Review of Systems  Constitutional: Negative for fever, chills and appetite change.  Respiratory: Negative for shortness of breath.   Cardiovascular: Negative for leg swelling.  Gastrointestinal: Negative for nausea, vomiting and abdominal pain.  Musculoskeletal: Positive for back pain.  Neurological: Negative for numbness.       Objective:   Physical Exam  Vitals reviewed. Constitutional: He is oriented to person, place, and time. He appears well-developed. No distress.  HENT:  Head: Normocephalic and atraumatic.  Mouth/Throat: Oropharynx is clear and moist. No  oropharyngeal exudate.  Eyes: EOM are normal. Pupils are equal, round, and reactive to light.  Neck: Normal range of motion. Neck supple. No JVD present.  Cardiovascular: Normal rate, regular rhythm and normal heart sounds.  Exam reveals no gallop and no friction rub.   No murmur heard. 2+ DPs  Pulmonary/Chest: Effort normal and breath sounds normal. No respiratory distress. He has no wheezes. He has no rales.  Abdominal: Soft. Bowel sounds are normal. He exhibits no distension. There is no tenderness. There is no rebound and no guarding.  Musculoskeletal: Normal range of motion. He exhibits tenderness. He exhibits no edema.  5/5 motor strength B/L upper and lower extremities; mild left lumbar TTP (reduced from last exam); spine is non-tender; left lumbar pain with raising each leg but no radiation.  Lymphadenopathy:    He has no cervical adenopathy.  Neurological: He is alert and oriented to person, place, and time. No cranial nerve deficit.  Patellar and achilles reflexes intact B/L; strength and sensation intact B/L upper and lower extremities.  Skin: Skin is warm. He is not diaphoretic.  No bruising or rash on back or abdomen.  Psychiatric: He has a normal mood and affect. His behavior is normal.          Assessment & Plan:  Please see problem based assessment and plan.

## 2014-02-17 NOTE — Progress Notes (Signed)
Case discussed with Dr. Wilson at the time of the visit.  We reviewed the resident's history and exam and pertinent patient test results.  I agree with the assessment, diagnosis, and plan of care documented in the resident's note. 

## 2014-02-17 NOTE — Assessment & Plan Note (Addendum)
His low back pain is improving with NSAID supporting an MSK etiology.  He has no systemic signs of infection, bleeding or spinal compression.  He is able to go about usual activities, sleep, walk and vital signs stable.  I do not think he needs CT abdomen at this time since he is doing better with NSAID and has no systemic signs or symptoms.   - CBC today to check H&H and ensure no hemorrhage - BMP today to check Cr with NSAID and HCTZ use - he has been instructed to go to ED with new or worsening symptoms - he will follow-up in clinic next week for routine check-up with PCP - if symptoms stop improving, worsen or new symptoms arise he may require imaging

## 2014-02-17 NOTE — Assessment & Plan Note (Signed)
I asked him more about DOE today.  He says it sometimes occurs after significant exertion (i.e. multiple trips up and down stairs carrying a load).  He is not getting dyspneic simply walking up the stairs or doing usual activities.  He denies orthopnea or lower extremity edema.  He has no signs of HF on exam, however I would suggest future discussion with PCP about need for ECHO (should he develop DOE with little exertion, or additional S&S of HF) given his long hx of untreated HTN.  His dyspnea after significant exertion could also be due to deconditioning.   - continue to follow-up - consider need for ECHO should symptom worsen or new symptoms begin

## 2014-02-18 LAB — LIPID PANEL
CHOL/HDL RATIO: 7.6 ratio
Cholesterol: 260 mg/dL — ABNORMAL HIGH (ref 0–200)
HDL: 34 mg/dL — AB (ref >39)
Triglycerides: 690 mg/dL — ABNORMAL HIGH (ref <150)

## 2014-02-18 LAB — BASIC METABOLIC PANEL WITH GFR
BUN: 22 mg/dL (ref 6–23)
CALCIUM: 9.7 mg/dL (ref 8.4–10.5)
CO2: 27 mEq/L (ref 19–32)
CREATININE: 0.99 mg/dL (ref 0.50–1.35)
Chloride: 97 mEq/L (ref 96–112)
GFR, Est African American: >89 mL/min
GFR, Est Non African American: 83 mL/min
GLUCOSE: 90 mg/dL (ref 70–99)
Potassium: 4.3 mEq/L (ref 3.5–5.3)
Sodium: 136 mEq/L (ref 135–145)

## 2014-02-23 ENCOUNTER — Emergency Department (HOSPITAL_COMMUNITY)
Admission: EM | Admit: 2014-02-23 | Discharge: 2014-02-23 | Disposition: A | Discharge status: Home / Self Care | Payer: Medicare Other | Attending: Emergency Medicine | Admitting: Emergency Medicine

## 2014-02-23 DIAGNOSIS — M79609 Pain in unspecified limb: Secondary | ICD-10-CM | POA: Diagnosis not present

## 2014-02-23 DIAGNOSIS — R21 Rash and other nonspecific skin eruption: Secondary | ICD-10-CM | POA: Insufficient documentation

## 2014-02-23 DIAGNOSIS — R011 Cardiac murmur, unspecified: Secondary | ICD-10-CM | POA: Insufficient documentation

## 2014-02-23 DIAGNOSIS — I10 Essential (primary) hypertension: Secondary | ICD-10-CM | POA: Insufficient documentation

## 2014-02-23 DIAGNOSIS — Z8669 Personal history of other diseases of the nervous system and sense organs: Secondary | ICD-10-CM | POA: Diagnosis not present

## 2014-02-23 DIAGNOSIS — Z8739 Personal history of other diseases of the musculoskeletal system and connective tissue: Secondary | ICD-10-CM | POA: Insufficient documentation

## 2014-02-23 DIAGNOSIS — Z8719 Personal history of other diseases of the digestive system: Secondary | ICD-10-CM | POA: Diagnosis not present

## 2014-02-23 DIAGNOSIS — Z8701 Personal history of pneumonia (recurrent): Secondary | ICD-10-CM | POA: Insufficient documentation

## 2014-02-23 DIAGNOSIS — Z79899 Other long term (current) drug therapy: Secondary | ICD-10-CM | POA: Insufficient documentation

## 2014-02-23 DIAGNOSIS — B029 Zoster without complications: Secondary | ICD-10-CM | POA: Insufficient documentation

## 2014-02-23 MED ORDER — ONDANSETRON HCL 4 MG PO TABS: 4.0000 mg | ORAL_TABLET | Freq: Four times a day (QID) | ORAL | Status: DC

## 2014-02-23 MED ORDER — HYDROCODONE-ACETAMINOPHEN 5-325 MG PO TABS
2.0000 | ORAL_TABLET | Freq: Once | ORAL | Status: AC
  Administered 2014-02-23: 2 via ORAL
  Filled 2014-02-23: qty 2

## 2014-02-23 MED ORDER — HYDROCODONE-ACETAMINOPHEN 5-325 MG PO TABS: 1.0000 | ORAL_TABLET | Freq: Four times a day (QID) | ORAL | Status: DC | PRN

## 2014-02-23 MED ORDER — ONDANSETRON 4 MG PO TBDP
8.0000 mg | ORAL_TABLET | Freq: Once | ORAL | Status: AC
  Administered 2014-02-23: 8 mg via ORAL
  Filled 2014-02-23: qty 2

## 2014-02-23 MED ORDER — FAMCICLOVIR 500 MG PO TABS: 500.0000 mg | ORAL_TABLET | Freq: Three times a day (TID) | ORAL | Status: DC

## 2014-02-23 NOTE — ED Provider Notes (Signed)
CSN: [id number]     Arrival date & time 02/23/14  0904 History  This chart was scribed for non-physician practitioner Junious Silk, PA-C, working with Doug Sou, MD by Leone Payor, ED Scribe. This patient was seen in room TR07C/TR07C and the patient's care was started at 9:29 AM.    Chief Complaint  Patient presents with  . Arm Pain    LT arm  . Rash     The history is provided by the patient. No language interpreter was used.    HPI Comments: David Cantu is a 58 y.o. male who presents to the Emergency Department complaining of 4 days of gradual onset, constant, gradually worsening swelling with associated tingling to the left arm. He reports associated rash to the same area that began yesterday. Patient also reports pain to the entire LUE. Patient states he was seen about 1 month ago for knee pain and was started on steroids. After starting the steroids, he reports being diagnosed with acute  pancreatitis. Patient is concerned that the steroids are causing his symptoms. He denies associated itching, chest pain, SOB, fever, chills.   Past Medical History  Diagnosis Date  . Hypertension   . Acute pancreatitis 02/03/2014  . Heart murmur   . Pneumonia 1960; ~ 1992  . YNWGNFAO(130.8)     "weekly" (02/03/2014)  . Migraine     "weekly" (02/03/2014)  . Arthritis     "shoulders" (02/03/2014)   Past Surgical History  Procedure Laterality Date  . Shoulder arthroscopy w/ rotator cuff repair Left 2011  . Tracheostomy  1960  . Tracheostomy closure  1960   No family history on file. History  Substance Use Topics  . Smoking status: Never Smoker   . Smokeless tobacco: Never Used  . Alcohol Use: Yes     Comment: 02/03/2014 "1 drink q 2 wks or so"    Review of Systems  Musculoskeletal: Positive for arthralgias and myalgias.  Skin: Positive for rash.  All other systems reviewed and are negative.     Allergies  Review of patient's allergies indicates no known allergies.  Home  Medications   Prior to Admission medications   Medication Sig Start Date End Date Taking? Authorizing Provider  hydrochlorothiazide (HYDRODIURIL) 25 MG tablet Take 1 tablet (25 mg total) by mouth daily. 02/13/14   Evelena Peat, DO   BP 149/85  Pulse 83  Temp(Src) 98.2 F (36.8 C) (Oral)  Resp 18  Ht 5\' 6"  (1.676 m)  Wt 178 lb (80.74 kg)  BMI 28.74 kg/m2  SpO2 100% Physical Exam  Nursing note and vitals reviewed. Constitutional: He is oriented to person, place, and time. He appears well-developed and well-nourished. No distress.  HENT:  Head: Normocephalic and atraumatic.  Right Ear: External ear normal.  Left Ear: External ear normal.  Nose: Nose normal.  Eyes: Conjunctivae are normal.  Neck: Normal range of motion. No tracheal deviation present.  Cardiovascular: Normal rate, regular rhythm and normal heart sounds.   Pulmonary/Chest: Effort normal and breath sounds normal. No stridor.  Abdominal: Soft. He exhibits no distension. There is no tenderness.  Musculoskeletal: Normal range of motion.  Neurological: He is alert and oriented to person, place, and time.  Skin: Skin is warm and dry. Rash noted. Rash is vesicular. He is not diaphoretic.  Vesicular rash in C5, C6 dermatomal pattern. No superimposed infection  Psychiatric: He has a normal mood and affect. His behavior is normal.    ED Course  Procedures (including critical care time)  DIAGNOSTIC STUDIES: Oxygen Saturation is 100% on RA, normal by my interpretation.    COORDINATION OF CARE: 9:34 AM Discussed treatment plan with pt at bedside and pt agreed to plan.   Labs Review Labs Reviewed - No data to display  Imaging Review No results found.   EKG Interpretation None      MDM   Final diagnoses:  Shingles   Patient presents to ED with rash concerning for shingles. Rash developed today. Patient given famciclovir and pain medication. Patient has follow up appointment early next week. Discussed reasons to  return to ED immediately. Vital signs stable for discharge. Dr. Ethelda ChickJacubowitz evaluated patient and agrees with plan. Patient / Family / Caregiver informed of clinical course, understand medical decision-making process, and agree with plan.   I personally performed the services described in this documentation, which was scribed in my presence. The recorded information has been reviewed and is accurate.    Mora BellmanHannah S Monic Engelmann, PA-C 02/24/14 1103

## 2014-02-23 NOTE — ED Notes (Signed)
MD at bedside. 

## 2014-02-23 NOTE — ED Notes (Signed)
Declined W/C at D/C and was escorted to lobby by RN. 

## 2014-02-23 NOTE — Discharge Instructions (Signed)

## 2014-02-23 NOTE — ED Notes (Signed)
Pt states he is feeling better; HOB down.

## 2014-02-23 NOTE — ED Notes (Signed)
Pt states he is feeling better and not nauseated anymore.

## 2014-02-23 NOTE — ED Notes (Signed)
PT pale and clammy; PA at bedside; pt laid flat and given crackers and coke. BP in 60's systolic. Pt alert and conversing. Nauseated.

## 2014-02-23 NOTE — ED Notes (Signed)
Pt reports rash and swelling to Lt  arm that started on Thursday . Pt reports he had worked in his yard cutting trees. Lt arm is now swollen with multiple red areas on posterior arm.

## 2014-02-23 NOTE — ED Provider Notes (Signed)
Patient with herpetiform, pain rash over left arm and C6-C7 dermatomal area. Patient alert not ill-appearing. No other complaint  Doug SouSam Shaunita Seney, MD 02/23/14 [phone]

## 2014-02-23 NOTE — ED Notes (Signed)
PA at bedside.

## 2014-02-25 ENCOUNTER — Encounter: Payer: Self-pay | Admitting: Internal Medicine

## 2014-02-25 ENCOUNTER — Ambulatory Visit (HOSPITAL_COMMUNITY)
Admission: RE | Admit: 2014-02-25 | Discharge: 2014-02-25 | Disposition: A | Discharge status: Home / Self Care | Payer: Medicare Other | Source: Ambulatory Visit | Attending: Internal Medicine | Admitting: Internal Medicine

## 2014-02-25 ENCOUNTER — Ambulatory Visit (INDEPENDENT_AMBULATORY_CARE_PROVIDER_SITE_OTHER): Payer: Medicare Other | Admitting: Internal Medicine

## 2014-02-25 VITALS — BP 153/86 | HR 82 | Temp 97.8°F | Ht 66.0 in | Wt 174.1 lb

## 2014-02-25 DIAGNOSIS — M47817 Spondylosis without myelopathy or radiculopathy, lumbosacral region: Secondary | ICD-10-CM | POA: Diagnosis not present

## 2014-02-25 DIAGNOSIS — M545 Low back pain, unspecified: Secondary | ICD-10-CM

## 2014-02-25 DIAGNOSIS — I10 Essential (primary) hypertension: Secondary | ICD-10-CM

## 2014-02-25 DIAGNOSIS — B029 Zoster without complications: Secondary | ICD-10-CM

## 2014-02-25 DIAGNOSIS — M51379 Other intervertebral disc degeneration, lumbosacral region without mention of lumbar back pain or lower extremity pain: Secondary | ICD-10-CM | POA: Insufficient documentation

## 2014-02-25 DIAGNOSIS — E785 Hyperlipidemia, unspecified: Secondary | ICD-10-CM

## 2014-02-25 DIAGNOSIS — M5137 Other intervertebral disc degeneration, lumbosacral region: Secondary | ICD-10-CM | POA: Insufficient documentation

## 2014-02-25 DIAGNOSIS — B0229 Other postherpetic nervous system involvement: Secondary | ICD-10-CM | POA: Insufficient documentation

## 2014-02-25 MED ORDER — GEMFIBROZIL 600 MG PO TABS: 600.0000 mg | ORAL_TABLET | Freq: Two times a day (BID) | ORAL | Status: DC

## 2014-02-25 MED ORDER — HYDROCODONE-ACETAMINOPHEN 5-325 MG PO TABS: 1.0000 | ORAL_TABLET | Freq: Four times a day (QID) | ORAL | Status: DC | PRN

## 2014-02-25 NOTE — Assessment & Plan Note (Signed)
BP Readings from Last 3 Encounters:  02/25/14 153/86  02/23/14 149/84  02/17/14 143/78    Lab Results  Component Value Date   NA 136 02/17/2014   K 4.3 02/17/2014   CREATININE 0.99 02/17/2014    Assessment: Blood pressure control: mildly elevated Progress toward BP goal:  unchanged Comments: Patient's BP may be elevated 2/2 back pain and pain from Shingles rash.  Plan: Medications:  continue current medications Educational resources provided:   Self management tools provided:   Other plans:

## 2014-02-25 NOTE — Assessment & Plan Note (Addendum)
Back pain: Patient has bilateral lower back pain that started during his hospitalization for acute pancreatitis on 02/03/14. Patient has also followed up in the IM clinic 2 times for the back pain. He states the pain is not getting any better. He states the pain was originally a 10/10 but is now a 5-6/10 when taking Aleve. He reports the pain is worse with sitting, especially for prolonged periods of time, and when lifting his right leg. The pain is alleviated when walking or standing up. He denies shooting pains down his legs, saddle anesthesia, loss of bladder or bowel control. Based on this information there is concern for spinal pathology vs. Complications from recent pancreatitis (pseudocyst).  - Continue Norco, Aleve for pain - X-ray of lumbar spine

## 2014-02-25 NOTE — Patient Instructions (Signed)
It was a pleasure seeing you today, [name].   Plan:  1. You will have an x-ray of the spine today. Please follow-up earlier if pain worsens. 2. High triglycerides- Start Gemfibrozil 600 mg twice a day 3. HTN- Continue taking HCTZ 25 mg 4. Shingles- Continue Famcyclovir. If symptoms worsen or do not get better in 2 weeks please follow-up sooner.

## 2014-02-25 NOTE — Progress Notes (Signed)
   Subjective:    Patient ID: David Cantu, male    DOB: 08/05/1955, 58 y.o.   MRN: [id number]  HPI David Cantu is a 58yo man w/ PMHx of HTN and acute pancreatitis (hospitalized on 02/03/14) who presents today for a follow-up visit with the following medical problems:  1. Shingles: Patient went to ED on 02/23/14 after a rash broke out on his left arm. The rash extends from his hand to halfway up his arm above the elbow. He states that the rash is painful, but denies itching. He was prescribed Famcyclovir in the ED and has taken the medication for 2 days. Patient reports he has had nausea and decreased appetite since the onset of the shingles rash. He denies fever, chills.   2. Lower back pain: Patient has bilateral lower back pain that started during his hospitalization for acute pancreatitis on 02/03/14. Patient has also followed up in the IM clinic 2 times for the back pain. He states the pain is not getting any better. He states the pain was originally a 10/10 but is now a 5-6/10 when taking Aleve. He reports the pain is worse with sitting, especially for prolonged periods of time, and when lifting his right leg. The pain is alleviated when walking or standing up. He denies shooting pains down his legs, saddle anesthesia, loss of bladder or bowel control.   3. HTN: BP stable in 140-150s/80s. Patient currently taking HCTZ 25 mg daily.  4: Hypertriglyceridemia: Last lipid panel done on 02/17/14 shows Cholesterol 260, Triglycerides 690, HDL 34, LDL not calculated due to elevated triglycerides.   Review of Systems    General: Denies recent weight changes, malaise. HEENT: Denies headaches, changes in vision, ear pain, rhinorhea, sore throat CV: Denies CP, SOB, palpitations, orthopnea, PND Pulm: Denies SOB, cough, wheezing GI: Denies abdominal pain, diarrhea, constipation, difficulty swallowing, melena, hematochezia GU: Denies dysuria, hematuria, urgency, frequency Msk: Denies muscle cramps, joint  pain Skin: Denies bruises, skin cancer Neuro: Denies numbness, tingling, weakness.   Objective:   Physical Exam General: alert, cooperative, NAD HEENT: [place]/AT, EOMI, PERRL, conjunctiva normal, mucus membranes dry Neck: supple, no JVD CV: RRR, normal S1/S2, no m/g/r Pulm: CTA bilaterally, breaths non-labored Abd: BS+, soft, nondistended, nontender, no organomegaly Ext: warm, no edema. Strength 5/5 in upper and lower extremities bilaterally. Tenderness to straight leg lift with right leg, not present on left.  Skin: Erythematous papules, some vesicular, present on left hand up to mid arm above elbow. Rash not present on back or abdomen. Right arm normal.  Neuro: alert, cooperative, appropriate mood and affect, CNs II-XII intact       Assessment & Plan:  Please see problem-based A&P

## 2014-02-26 ENCOUNTER — Telehealth: Payer: Self-pay | Admitting: Internal Medicine

## 2014-02-26 NOTE — Telephone Encounter (Signed)
[name] was contacted in regards to the results of his lumbar x-ray. He was informed of the results and instructed to follow-up sooner if the pain worsens.

## 2014-02-26 NOTE — Progress Notes (Signed)
INTERNAL MEDICINE TEACHING ATTENDING ADDENDUM - Earl LagosNischal Thurman Sarver, MD: I personally saw and evaluated Mr. [name] in this clinic visit in conjunction with the resident, Dr. Beckie Saltsrivet. I have discussed patient's plan of care with medical resident during this visit. I have confirmed the physical exam findings and have read and agree with the clinic note including the plan with the following addition: - Patient follow up for rash over L arm and persistent lower back pain - On exam- Pt with vesicular painful rash over L arm, Cardio- RRR, normal heart sounds, Lungs- CTA b/l - Would continue with famcyclovir for now for likely Zoster - Follow up x ray lumbar spine. If persistent pain may need referral to PT, MRI - c/w pain control for now

## 2014-02-27 NOTE — ED Provider Notes (Signed)
Medical screening examination/treatment/procedure(s) were conducted as a shared visit with non-physician practitioner(s) and myself.  I personally evaluated the patient during the encounter.   EKG Interpretation None       Doug SouSam Khila Papp, MD 02/27/14 [id number]

## 2014-03-03 ENCOUNTER — Encounter: Payer: Self-pay | Admitting: Internal Medicine

## 2014-03-03 ENCOUNTER — Other Ambulatory Visit: Payer: Self-pay | Admitting: Internal Medicine

## 2014-03-03 MED ORDER — ONDANSETRON HCL 4 MG PO TABS: 4.0000 mg | ORAL_TABLET | Freq: Four times a day (QID) | ORAL | Status: DC

## 2014-03-03 MED ORDER — HYDROCHLOROTHIAZIDE 12.5 MG PO TABS: 25.0000 mg | ORAL_TABLET | Freq: Every day | ORAL | Status: DC

## 2014-03-04 ENCOUNTER — Other Ambulatory Visit: Payer: Self-pay | Admitting: Internal Medicine

## 2014-03-04 MED ORDER — ONDANSETRON HCL 4 MG PO TABS: 4.0000 mg | ORAL_TABLET | Freq: Three times a day (TID) | ORAL | Status: DC | PRN

## 2014-03-04 MED ORDER — HYDROCHLOROTHIAZIDE 25 MG PO TABS: 25.0000 mg | ORAL_TABLET | Freq: Every day | ORAL | Status: DC

## 2014-03-04 NOTE — Telephone Encounter (Signed)
You will need to refuse this request by picking already responded to by other means or it will stay forever

## 2014-03-04 NOTE — Telephone Encounter (Signed)
I just refilled the Zofran so he should be all set now. Thanks!

## 2014-03-07 ENCOUNTER — Encounter: Payer: Self-pay | Admitting: Internal Medicine

## 2014-03-07 ENCOUNTER — Other Ambulatory Visit: Payer: Self-pay | Admitting: Internal Medicine

## 2014-03-07 ENCOUNTER — Other Ambulatory Visit: Payer: Self-pay | Admitting: *Deleted

## 2014-03-07 NOTE — Telephone Encounter (Signed)
Spoke with patient in regards to arm pain from shingles. He was instructed that I need to see him in clinic before I can give another prescription for hydrocodone. He understands and will call to make an appointment for early next week.

## 2014-03-12 ENCOUNTER — Ambulatory Visit (INDEPENDENT_AMBULATORY_CARE_PROVIDER_SITE_OTHER): Payer: Medicare Other | Admitting: Internal Medicine

## 2014-03-12 ENCOUNTER — Encounter: Payer: Self-pay | Admitting: Internal Medicine

## 2014-03-12 VITALS — BP 139/87 | HR 74 | Temp 98.0°F | Wt 172.0 lb

## 2014-03-12 DIAGNOSIS — M545 Low back pain, unspecified: Secondary | ICD-10-CM

## 2014-03-12 DIAGNOSIS — I10 Essential (primary) hypertension: Secondary | ICD-10-CM

## 2014-03-12 DIAGNOSIS — B029 Zoster without complications: Secondary | ICD-10-CM

## 2014-03-12 MED ORDER — HYDROCODONE-ACETAMINOPHEN 10-325 MG PO TABS: 1.0000 | ORAL_TABLET | Freq: Four times a day (QID) | ORAL | Status: DC | PRN

## 2014-03-12 MED ORDER — OXYCODONE HCL ER 10 MG PO T12A: 10.0000 mg | EXTENDED_RELEASE_TABLET | Freq: Every evening | ORAL | Status: DC | PRN

## 2014-03-12 NOTE — Progress Notes (Signed)
Patient ID: David Cantu, male   DOB: 1955/09/29, 58 y.o.   MRN: [id number]  I met with David Cantu with Dr Arcelia Jew. He has suffered with herpes zoster in his left arm recently, and though the lesions have healed, he still suffers from 8-10/10 neuritic type of pain which is not relieved by 10 mg of Norco which he is taking q6 hourly. He is unable to sleep at night, and thus we decide to prescribe temporarily Oxycontin 10 mg to be taken at nighttime to help him sleep. Only 30 tablets have been dispensed.   I saw and evaluated the patient.  I personally confirmed the key portions of the history and exam documented by Dr. Arcelia Jew and I reviewed pertinent patient test results.  The assessment, diagnosis, and plan were formulated together and I agree with the documentation in the resident's note.

## 2014-03-12 NOTE — Assessment & Plan Note (Signed)
Pt reports he has not had relief of pain. Pain is affecting sleep and Norco 5-325 mg only taking edge off of pain.  - Increase Norco to 10-325 mg Q6H as needed - Start Oxycontin 10 mg at bed time. Can take Q12H if needed.

## 2014-03-12 NOTE — Patient Instructions (Addendum)
It was a pleasure taking care of you today, [name].  Here is what we discussed today:  1. Shingles pain - Start taking Oxycontin 10 mg at bedtime. If you need to, you can take this pill every 12 hours - Start taking Norco 10-325 mg every 6 hours as needed - We will schedule you for the Zostavax vaccine once this episode of shingles is better  2. High Blood Pressure - Your blood pressure is improving- keep up the great work! It may be slightly elevated due to your pain. - Continue walking  General Instructions:   Please bring your medicines with you each time you come to clinic.  Medicines may include prescription medications, over-the-counter medications, herbal remedies, eye drops, vitamins, or other pills.   Progress Toward Treatment Goals:  Treatment Goal 02/25/2014  Blood pressure unchanged    Self Care Goals & Plans:  Self Care Goal 02/25/2014  Manage my medications -  Eat healthy foods eat more vegetables; eat fruit for snacks and desserts  Be physically active take a walk every day    No flowsheet data found.   Care Management & Community Referrals:  No flowsheet data found.

## 2014-03-12 NOTE — Assessment & Plan Note (Signed)
BP Readings from Last 3 Encounters:  03/12/14 139/87  02/25/14 153/86  02/23/14 149/84    Lab Results  Component Value Date   NA 136 02/17/2014   K 4.3 02/17/2014   CREATININE 0.99 02/17/2014    Assessment: Blood pressure control: mildly elevated Progress toward BP goal:  improved Comments: BP much improved, could still be elevated due to pain  Plan: Medications:  continue current medications Educational resources provided:   Self management tools provided:   Other plans: Continue HCTZ. Once pain from zoster infection resolves, BP will likely be better

## 2014-03-12 NOTE — Progress Notes (Signed)
   Subjective:    Patient ID: David MayerLawrence Curvin, male    DOB: 05/03/1956, 58 y.o.   MRN: [id number]  HPI Mr. [name] is a 58yo man w/ PMHx of HTN, HLD, acute pancreatitis (hospitalized on 02/03/14), herpes zoster, and low back pain who presents today for the following:  1. Residual Arm Pain from Zoster: Patient reports he is still having pain in his left arm from the zoster outbreak. He describes the pain as 8/10 in severity, with a burning sensation from his shoulder down to his hand. He also describes tingling/numbness in the tips of his fingers on his left hand and difficulty opening and closing his hand. He has not slept well for a month due to pain from the zoster outbreak and previous lower back pain. He states Norco "takes the edge off" of the pain, but does not completely resolve the pain. He is currently prescribed Norco 5-325 mg Q6H.   2. HTN: BP is much improved, 139/87 today. BP may be slightly elevated due to pain as well. He takes HCTZ 25 mg daily.   3. Low Back Pain: Patient states his lower back pain has completely resolved. He has been walking daily and doing leg and back exercises that have helped the pain. He is more concerned about his arm pain today.    Review of Systems General: Denies fever, chills, change in appetite HEENT: Denies headaches, ear pain, rhinorrhea, sore throat CV: Denies CP, palpitations, SOB, orthopnea Pulm: Denies SOB, cough, wheezing GI: Denies abdominal pain, nausea, vomiting, diarrhea, constipation GU: Denies dysuria, hematuria, frequency Msk: Denies muscle cramps, joint pains Neuro: Denies weakness Skin: Denies any new rashes, bruises    Objective:   Physical Exam General: alert, cooperative, NAD HEENT: /AT, EOMI, PERRL, mucus membranes moist Neck: supple, no JVD CV: RRR, normal S1/S2, no m/g/r Pulm: CTA bilaterally, breaths non-labored Abd: BS+, soft, non-distended, non-tender Ext: warm, moves all, no edema. Left upper extremity has  residual scars from shoulder to hand from zoster infection, tenderness to palpation in left hand and forearm, left hand appears slightly more swollen than right hand Neuro: alert and oriented x 3, pleasant, CNs II-XII intact, strength 5/5 in lower extremities bilaterally and right upper extremity. Strength 3/5 in left upper extremity due to pain       Assessment & Plan:

## 2014-03-12 NOTE — Assessment & Plan Note (Signed)
Pt reports back pain has resolved  

## 2014-03-25 ENCOUNTER — Encounter: Payer: Self-pay | Admitting: Internal Medicine

## 2014-05-01 ENCOUNTER — Ambulatory Visit (INDEPENDENT_AMBULATORY_CARE_PROVIDER_SITE_OTHER): Payer: Medicare Other | Admitting: Internal Medicine

## 2014-05-01 ENCOUNTER — Encounter: Payer: Self-pay | Admitting: Internal Medicine

## 2014-05-01 VITALS — BP 129/66 | HR 61 | Temp 98.0°F | Ht 66.0 in | Wt 172.2 lb

## 2014-05-01 DIAGNOSIS — B029 Zoster without complications: Secondary | ICD-10-CM

## 2014-05-01 DIAGNOSIS — I10 Essential (primary) hypertension: Secondary | ICD-10-CM

## 2014-05-01 DIAGNOSIS — Z23 Encounter for immunization: Secondary | ICD-10-CM

## 2014-05-01 MED ORDER — PREGABALIN 75 MG PO CAPS: 75.0000 mg | ORAL_CAPSULE | Freq: Three times a day (TID) | ORAL | Status: DC

## 2014-05-01 NOTE — Progress Notes (Signed)
Patient ID: David Cantu, male   DOB: 08/11/1955, 58 y.o.   MRN: [id number]  Subjective:   Patient ID: David Cantu male   DOB: 06/17/1956 58 y.o.   MRN: [id number]  HPI: Mr.David Cantu is a 58 y.o. M w/ PMH HTN, HLD, acute pancreatitis (hospitalized on 02/03/14), herpes zoster, and low back pain who presents for a routine f/u and to discuss his medications.   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 A neighbor, who is a physician, provided the pt with a sample of Lyrica, which has really helped improve the pain and neuropathy. He has stopped taking the narcotics.   He denies fevers, chills, headaches, abd pain, N/V/D, or peripheral edema. He does endorse SOB with exertion over the past few months since developing pancreatitis in July.    Past Medical History  Diagnosis Date  . Hypertension   . Acute pancreatitis 02/03/2014  . Heart murmur   . Pneumonia 1960; ~ 1992  . IONGEXBM(841.3Headache(784.0)     "weekly" (02/03/2014)  . Migraine     "weekly" (02/03/2014)  . Arthritis     "shoulders" (02/03/2014)   Current Outpatient Prescriptions  Medication Sig Dispense Refill  . famciclovir (FAMVIR) 500 MG tablet Take 1 tablet (500 mg total) by mouth 3 (three) times daily.  21 tablet  0  . gemfibrozil (LOPID) 600 MG tablet Take 1 tablet (600 mg total) by mouth 2 (two) times daily.  60 tablet  11  . hydrochlorothiazide (HYDRODIURIL) 25 MG tablet Take 1 tablet (25 mg total) by mouth daily.  30 tablet  3  . HYDROcodone-acetaminophen (NORCO) 10-325 MG per tablet Take 1 tablet by mouth every 6 (six) hours as needed.  60 tablet  0  . naproxen sodium (ANAPROX) 220 MG tablet Take 440 mg by mouth 2 (two) times daily with a meal.      . ondansetron (ZOFRAN) 4 MG tablet Take 1 tablet (4 mg total) by mouth every 8 (eight) hours as needed for nausea or  vomiting.  30 tablet  1  . OxyCODONE (OXYCONTIN) 10 mg T12A 12 hr tablet Take 1 tablet (10 mg total) by mouth at bedtime as needed.  30 tablet  0   No current facility-administered medications for this visit.   No family history on file. History   Social History  . Marital Status: Married    Spouse Name: N/A    Number of Children: N/A  . Years of Education: N/A   Social History Main Topics  . Smoking status: Never Smoker   . Smokeless tobacco: Never Used  . Alcohol Use: Yes     Comment: Socially.  . Drug Use: No  . Sexual Activity: None   Other Topics Concern  . None   Social History Narrative  . None   Review of Systems: A 12 point ROS was performed; pertinent positives and negatives were noted in the HPI   Objective:  Physical Exam: Filed Vitals:   05/01/14 1012  BP: 129/66  Pulse: 61  Temp: 98 F (36.7 C)  TempSrc: Oral  Height: 5\' 6"  (1.676 m)  Weight: 172 lb 3.2 oz (78.109 kg)  SpO2: 97%   Constitutional: Vital signs reviewed.  Patient is a well-developed and well-nourished male in no acute distress and cooperative with exam. Alert and oriented x3.  Head: Normocephalic and atraumatic Eyes:  PERRL, EOMI. No scleral icterus.  Cardiovascular: RRR, no MRG Pulmonary/Chest: normal respiratory effort, CTAB, no wheezes, rales, or rhonchi Abdominal: Soft. Non-tender, non-distended Musculoskeletal: No joint deformities, erythema, or stiffness Neurological: A&O x3, Strength is normal and symmetric bilaterally, cranial nerve II-XII are grossly intact, no focal motor deficit Skin: Warm, dry and intact. No rash.  Psychiatric: Normal mood and affect. Speech and behavior is normal.    Assessment & Plan:   Please refer to Problem List based Assessment and Plan

## 2014-05-01 NOTE — Progress Notes (Signed)
Case discussed with Dr. Glenn soon after the resident saw the patient.  We reviewed the resident's history and exam and pertinent patient test results.  I agree with the assessment, diagnosis, and plan of care documented in the resident's note. 

## 2014-05-01 NOTE — Assessment & Plan Note (Signed)
No further lesions but still with neuropathic pain improved with Lyrica sample provided by a physician neighbor. He has stopped taking any narcotics. Will continue the Lyrica for now since it is workign so well for him.  - Lyrica 75mg  po qday - F/u with PCP in Nov

## 2014-05-01 NOTE — Assessment & Plan Note (Addendum)
BP Readings from Last 3 Encounters:  05/01/14 129/66  03/12/14 139/87  02/25/14 153/86    Lab Results  Component Value Date   NA 136 02/17/2014   K 4.3 02/17/2014   CREATININE 0.99 02/17/2014    Assessment: Blood pressure control: controlled Progress toward BP goal:  improved Comments: BP is well controlled today  Plan: Medications:  continue current medications; HCTZ 25mg  daily Other plans: Keep PCP appt in Nov.

## 2014-05-01 NOTE — Patient Instructions (Addendum)
You can continue to take the Lyrica daily for your neuropathic (nerve) pain.   Please keep you follow up appointment with Dr. Beckie Saltsivet on November 3rd.    General Instructions:   Thank you for bringing your medicines today. This helps us keep you safe from mistakes.   Progress Toward Treatment Goals:  Treatment Goal 05/01/2014  Blood pressure improved    Self Care Goals & Plans:  Self Care Goal 05/01/2014  Manage my medications take my medicines as prescribed; bring my medications to every visit; refill my medications on time  Eat healthy foods eat more vegetables; eat foods that are low in salt; eat baked foods instead of fried foods  Be physically active find an activity I enjoy    No flowsheet data found.   Care Management & Community Referrals:  No flowsheet data found.     Pregabalin capsules What is this medicine? PREGABALIN (pre GAB a lin) is used to treat nerve pain from diabetes, shingles, spinal cord injury, and fibromyalgia. It is also used to control seizures in epilepsy. This medicine may be used for other purposes; ask your health care provider or pharmacist if you have questions. COMMON BRAND NAME(S): Lyrica What should I tell my health care provider before I take this medicine? They need to know if you have any of these conditions: -bleeding problems -heart disease, including heart failure -history of alcohol or drug abuse -kidney disease -suicidal thoughts, plans, or attempt; a previous suicide attempt by you or a family member -an unusual or allergic reaction to pregabalin, gabapentin, other medicines, foods, dyes, or preservatives -pregnant or trying to get pregnant or trying to conceive with your partner -breast-feeding How should I use this medicine? Take this medicine by mouth with a glass of water. Follow the directions on the prescription label. You can take this medicine with or without food. Take your doses at regular intervals. Do not take your  medicine more often than directed. Do not stop taking except on your doctor's advice. A special MedGuide will be given to you by the pharmacist with each prescription and refill. Be sure to read this information carefully each time. Talk to your pediatrician regarding the use of this medicine in children. Special care may be needed. Overdosage: If you think you have taken too much of this medicine contact a poison control center or emergency room at once. NOTE: This medicine is only for you. Do not share this medicine with others. What if I miss a dose? If you miss a dose, take it as soon as you can. If it is almost time for your next dose, take only that dose. Do not take double or extra doses. What may interact with this medicine? -alcohol -certain medicines for blood pressure like captopril, enalapril, or lisinopril -certain medicines for diabetes, like pioglitazone or rosiglitazone -certain medicines for anxiety or sleep -narcotic medicines for pain This list may not describe all possible interactions. Give your health care provider a list of all the medicines, herbs, non-prescription drugs, or dietary supplements you use. Also tell them if you smoke, drink alcohol, or use illegal drugs. Some items may interact with your medicine. What should I watch for while using this medicine? Tell your doctor or healthcare professional if your symptoms do not start to get better or if they get worse. Visit your doctor or health care professional for regular checks on your progress. Do not stop taking except on your doctor's advice. You may develop a severe reaction. Your  doctor will tell you how much medicine to take. Wear a medical identification bracelet or chain if you are taking this medicine for seizures, and carry a card that describes your disease and details of your medicine and dosage times. You may get drowsy or dizzy. Do not drive, use machinery, or do anything that needs mental alertness until  you know how this medicine affects you. Do not stand or sit up quickly, especially if you are an older patient. This reduces the risk of dizzy or fainting spells. Alcohol may interfere with the effect of this medicine. Avoid alcoholic drinks. If you have a heart condition, like congestive heart failure, and notice that you are retaining water and have swelling in your hands or feet, contact your health care provider immediately. The use of this medicine may increase the chance of suicidal thoughts or actions. Pay special attention to how you are responding while on this medicine. Any worsening of mood, or thoughts of suicide or dying should be reported to your health care professional right away. This medicine has caused reduced sperm counts in some men. This may interfere with the ability to father a child. You should talk to your doctor or health care professional if you are concerned about your fertility. Women who become pregnant while using this medicine for seizures may enroll in the [place] Antiepileptic Drug Pregnancy Registry by calling [phone]. This registry collects information about the safety of antiepileptic drug use during pregnancy. What side effects may I notice from receiving this medicine? Side effects that you should report to your doctor or health care professional as soon as possible: -allergic reactions like skin rash, itching or hives, swelling of the face, lips, or tongue -breathing problems -changes in vision -chest pain -confusion -jerking or unusual movements of any part of your body -loss of memory -muscle pain, tenderness, or weakness -suicidal thoughts or other mood changes -swelling of the ankles, feet, hands -unusual bruising or bleeding Side effects that usually do not require medical attention (Report these to your doctor or health care professional if they continue or are bothersome.): -dizziness -drowsiness -dry  mouth -headache -nausea -tremors -trouble sleeping -weight gain This list may not describe all possible side effects. Call your doctor for medical advice about side effects. You may report side effects to FDA at 1-800-FDA-1088. Where should I keep my medicine? Keep out of the reach of children. This medicine can be abused. Keep your medicine in a safe place to protect it from theft. Do not share this medicine with anyone. Selling or giving away this medicine is dangerous and against the law. Store at room temperature between 15 and 30 degrees C (59 and 86 degrees F). Throw away any unused medicine after the expiration date. NOTE: This sheet is a summary. It may not cover all possible information. If you have questions about this medicine, talk to your doctor, pharmacist, or health care provider.  2015, [employer]. (2011-01-20 20:00:36)

## 2014-06-03 ENCOUNTER — Encounter: Payer: Self-pay | Admitting: Internal Medicine

## 2014-06-03 ENCOUNTER — Ambulatory Visit (INDEPENDENT_AMBULATORY_CARE_PROVIDER_SITE_OTHER): Payer: Medicare Other | Admitting: Internal Medicine

## 2014-06-03 VITALS — BP 129/64 | HR 64 | Temp 97.7°F | Ht 66.0 in | Wt 176.8 lb

## 2014-06-03 DIAGNOSIS — E785 Hyperlipidemia, unspecified: Secondary | ICD-10-CM

## 2014-06-03 DIAGNOSIS — I10 Essential (primary) hypertension: Secondary | ICD-10-CM

## 2014-06-03 DIAGNOSIS — B0229 Other postherpetic nervous system involvement: Secondary | ICD-10-CM

## 2014-06-03 DIAGNOSIS — Z0000 Encounter for general adult medical examination without abnormal findings: Secondary | ICD-10-CM | POA: Insufficient documentation

## 2014-06-03 NOTE — Patient Instructions (Signed)
-   Continue taking Lyrica 75 mg 2 times a day. At the end of December (07/30/14), start taking Lyrica 75 mg once a day - Colonoscopy- referral to Gastroenterology has been made. You will be contacted to set up an appointment - Repeat lipid panel today. I will call you with your results.  General Instructions:   Thank you for bringing your medicines today. This helps us keep you safe from mistakes.   Progress Toward Treatment Goals:  Treatment Goal 05/01/2014  Blood pressure improved    Self Care Goals & Plans:  Self Care Goal 05/01/2014  Manage my medications take my medicines as prescribed; bring my medications to every visit; refill my medications on time  Eat healthy foods eat more vegetables; eat foods that are low in salt; eat baked foods instead of fried foods  Be physically active find an activity I enjoy    No flowsheet data found.   Care Management & Community Referrals:  No flowsheet data found.

## 2014-06-03 NOTE — Assessment & Plan Note (Signed)
Patient referred for colonoscopy today. Patient already received flu shot.

## 2014-06-03 NOTE — Assessment & Plan Note (Signed)
Will check lipid profile today since patient has been on Gemfibrozil for 3 months now. Will reassess if triglycerides have decreased and if LDL can now be calculated.

## 2014-06-03 NOTE — Assessment & Plan Note (Signed)
Patient reports Lyrica has improved his symptoms significantly. Will continue for total of 3 months (started 04/30/14). Will then taper down to once daily for a few weeks and discontinue medication completely if symptoms do not return.

## 2014-06-03 NOTE — Progress Notes (Signed)
   Subjective:    Patient ID: David Cantu, male    DOB: 04/10/1956, 58 y.o.   MRN: [id number]  HPI [name] is a 58yo man w/ PMHx of HTN, HLD, acute pancreatitis (hospitalized on 02/03/14), and post herpetic who presents today for the following:  1. Post herpetic neuralgia: Patient reports his pain is well controlled with Lyrica 75 mg BID. He states a neighbor who is a physician prescribed this to him and he no longer needed Norco and Oxycontin. He reports he is able to make a fist with his right hand now which he previously was not able to do. He notes some tingling/numbness in his right fingertips, but denies any pain or lesions.   2. HTN: BP today 129/64. He takes HCTZ 25 mg daily.   3. HLD: Last lipid profile on 02/17/14 shows Chol 260, Trigly 690, HDL 34, and LDL not able to be calculated. He was started on Gemfibrozil in July this year.   Review of Systems General: Denies fever, chills, night sweats, changes in weight, changes in appetite HEENT: Denies headaches, ear pain, changes in vision, rhinorrhea, sore throat CV: Denies CP, palpitations, SOB, orthopnea Pulm: Denies SOB, cough, wheezing GI: Denies abdominal pain, nausea, vomiting, diarrhea, constipation, melena, hematochezia GU: Denies dysuria, hematuria, frequency Msk: Denies muscle cramps, joint pains Neuro: See HPI Skin: Denies rashes, bruising    Objective:   Physical Exam General: alert, sitting up in chair, NAD HEENT: [place]/AT, EOMI, PERRL, sclera anicteric, pharynx non-erythematous, mucus membranes moist Neck: supple, no JVD, no lymphadenopathy CV: RRR, normal S1/S2, no m/g/r Pulm: CTA bilaterally, breaths non-labored, no wheezing Abd: BS+, soft, non-distended, non-tender Ext: warm, no edema, moves all Neuro: alert and oriented x 3, CNs II-XII intact, strength 5/5 in upper and lower extremities bilaterally      Assessment & Plan:

## 2014-06-03 NOTE — Assessment & Plan Note (Signed)
BP Readings from Last 3 Encounters:  06/03/14 129/64  05/01/14 129/66  03/12/14 139/87    Lab Results  Component Value Date   NA 136 02/17/2014   K 4.3 02/17/2014   CREATININE 0.99 02/17/2014    Assessment: Blood pressure control: controlled Progress toward BP goal:  at goal Comments:   Plan: Medications:  continue current medications Educational resources provided:   Self management tools provided:   Other plans: BP at goal. Will continue HCTZ 25 mg daily.

## 2014-06-04 LAB — LIPID PANEL
Cholesterol: 244 mg/dL — ABNORMAL HIGH (ref 0–200)
HDL: 47 mg/dL (ref >39)
LDL CALC: 142 mg/dL — AB (ref 0–99)
Total CHOL/HDL Ratio: 5.2 Ratio
Triglycerides: 274 mg/dL — ABNORMAL HIGH (ref <150)
VLDL: 55 mg/dL — ABNORMAL HIGH (ref 0–40)

## 2014-06-05 ENCOUNTER — Encounter: Payer: Self-pay | Admitting: Internal Medicine

## 2014-06-08 NOTE — Progress Notes (Signed)
Internal Medicine Clinic Attending  I saw and evaluated the patient.  I personally confirmed the key portions of the history and exam documented by Dr. Rivet and I reviewed pertinent patient test results.  The assessment, diagnosis, and plan were formulated together and I agree with the documentation in the resident's note. 

## 2014-06-10 ENCOUNTER — Telehealth: Payer: Self-pay | Admitting: Internal Medicine

## 2014-06-10 NOTE — Telephone Encounter (Signed)
Left message on patient's answering machine about labs. Told patient he can call clinic if any concerns.

## 2014-07-29 ENCOUNTER — Ambulatory Visit (AMBULATORY_SURGERY_CENTER): Payer: Self-pay | Admitting: *Deleted

## 2014-07-29 VITALS — Ht 66.0 in | Wt 176.0 lb

## 2014-07-29 DIAGNOSIS — Z1211 Encounter for screening for malignant neoplasm of colon: Secondary | ICD-10-CM

## 2014-07-29 NOTE — Progress Notes (Signed)
No egg or soy allergy. No anesthesia problems.  No home O2.  No diet meds.  

## 2014-08-12 ENCOUNTER — Encounter: Payer: Medicare Other | Admitting: Internal Medicine

## 2014-08-20 ENCOUNTER — Encounter: Payer: Self-pay | Admitting: Internal Medicine

## 2014-08-20 ENCOUNTER — Ambulatory Visit (AMBULATORY_SURGERY_CENTER): Payer: Medicare Other | Admitting: Internal Medicine

## 2014-08-20 VITALS — BP 134/84 | HR 64 | Temp 97.8°F | Resp 15 | Ht 66.0 in | Wt 176.0 lb

## 2014-08-20 DIAGNOSIS — Z1211 Encounter for screening for malignant neoplasm of colon: Secondary | ICD-10-CM

## 2014-08-20 MED ORDER — SODIUM CHLORIDE 0.9 % IV SOLN: 500.0000 mL | INTRAVENOUS | Status: DC

## 2014-08-20 NOTE — Progress Notes (Signed)
Report to PACU, RN, vss, BBS= Clear.  

## 2014-08-20 NOTE — Op Note (Signed)
[facility] [address] [place], [id number]   COLONOSCOPY PROCEDURE REPORT  PATIENT: David MayerGandolfo, Tobiah  MR#: [id number] BIRTHDATE: 1955/12/29 , 58  yrs. old GENDER: male ENDOSCOPIST: Iva Booparl E Gessner, MD, [facility] PROCEDURE DATE:  08/20/2014 PROCEDURE:   Colonoscopy, screening First Screening Colonoscopy - Avg.  risk and is 50 yrs.  old or older Yes.  Prior Negative Screening - Now for repeat screening. N/A  History of Adenoma - Now for follow-up colonoscopy & has been > or = to 3 yrs.  N/A  Polyps Removed Today? No.  Polyps Removed Today? No.  Recommend repeat exam, <10 yrs? Polyps Removed Today? No.  Recommend repeat exam, <10 yrs? No. ASA CLASS:   Class II INDICATIONS:first colonoscopy and average risk for colorectal cancer. MEDICATIONS: Propofol 200 mg IV and Monitored anesthesia care  DESCRIPTION OF PROCEDURE:   After the risks benefits and alternatives of the procedure were thoroughly explained, informed consent was obtained.  The digital rectal exam revealed no abnormalities of the rectum, revealed no prostatic nodules, and revealed the prostate was not enlarged.   The LB [id number] endoscope was introduced through the anus and advanced to the cecum, which was identified by both the appendix and ileocecal valve. No adverse events experienced.   The quality of the prep was good, using MiraLax  The instrument was then slowly withdrawn as the colon was fully examined.      COLON FINDINGS: A normal appearing cecum, ileocecal valve, and appendiceal orifice were identified.  The ascending, transverse, descending, sigmoid colon, and rectum appeared unremarkable. Retroflexed views revealed no abnormalities. The time to cecum=1 minutes 29 seconds.  Withdrawal time=9 minutes 21 seconds.  The scope was withdrawn and the procedure completed. COMPLICATIONS: There were no immediate complications.  ENDOSCOPIC IMPRESSION: Normal colonoscopy - excellent prep -  first screening  RECOMMENDATIONS: Repeat colonoscopy 10 years.  eSigned:  Iva Booparl E Gessner, MD, [facility] 08/20/2014 1:49 PM   cc: The Patient and Rich Numberarly Rivet, MD

## 2014-08-20 NOTE — Patient Instructions (Addendum)
Your colonoscopy was normal!  Next routine colonoscopy in 10 years - 2026  I appreciate the opportunity to care for you. Iva Boop, MD, FACG  YOU HAD AN ENDOSCOPIC PROCEDURE TODAY AT THE [facility]: Refer to the procedure report that was given to you for any specific questions about what was found during the examination.  If the procedure report does not answer your questions, please call your gastroenterologist to clarify.  If you requested that your care partner not be given the details of your procedure findings, then the procedure report has been included in a sealed envelope for you to review at your convenience later.  YOU SHOULD EXPECT: Some feelings of bloating in the abdomen. Passage of more gas than usual.  Walking can help get rid of the air that was put into your GI tract during the procedure and reduce the bloating. If you had a lower endoscopy (such as a colonoscopy or flexible sigmoidoscopy) you may notice spotting of blood in your stool or on the toilet paper. If you underwent a bowel prep for your procedure, then you may not have a normal bowel movement for a few days.  DIET: Your first meal following the procedure should be a light meal and then it is ok to progress to your normal diet.  A half-sandwich or bowl of soup is an example of a good first meal.  Heavy or fried foods are harder to digest and may make you feel nauseous or bloated.  Likewise meals heavy in dairy and vegetables can cause extra gas to form and this can also increase the bloating.  Drink plenty of fluids but you should avoid alcoholic beverages for 24 hours.  ACTIVITY: Your care partner should take you home directly after the procedure.  You should plan to take it easy, moving slowly for the rest of the day.  You can resume normal activity the day after the procedure however you should NOT DRIVE or use heavy machinery for 24 hours (because of the sedation medicines used during the  test).    SYMPTOMS TO REPORT IMMEDIATELY: A gastroenterologist can be reached at any hour.  During normal business hours, 8:30 AM to 5:00 PM Monday through Friday, call [phone].  After hours and on weekends, please call the GI answering service at [phone] who will take a message and have the physician on call contact you.   Following lower endoscopy (colonoscopy or flexible sigmoidoscopy):  Excessive amounts of blood in the stool  Significant tenderness or worsening of abdominal pains  Swelling of the abdomen that is new, acute  Fever of 100F or higher  FOLLOW UP: If any biopsies were taken you will be contacted by phone or by letter within the next 1-3 weeks.  Call your gastroenterologist if you have not heard about the biopsies in 3 weeks.  Our staff will call the home number listed on your records the next business day following your procedure to check on you and address any questions or concerns that you may have at that time regarding the information given to you following your procedure. This is a courtesy call and so if there is no answer at the home number and we have not heard from you through the emergency physician on call, we will assume that you have returned to your regular daily activities without incident.  SIGNATURES/CONFIDENTIALITY: You and/or your care partner have signed paperwork which will be entered into your electronic medical record.  These  signatures attest to the fact that that the information above on your After Visit Summary has been reviewed and is understood.  Full responsibility of the confidentiality of this discharge information lies with you and/or your care-partner.  Recommendations Discharge instructions given to patient/care partner.

## 2014-08-21 ENCOUNTER — Telehealth: Payer: Self-pay | Admitting: *Deleted

## 2014-08-21 NOTE — Telephone Encounter (Signed)
  Follow up Call-  Call back number 08/20/2014  Post procedure Call Back phone  # [phone]  Permission to leave phone message Yes     Patient questions:  Do you have a fever, pain , or abdominal swelling? No. Pain Score  0 *  Have you tolerated food without any problems? Yes.    Have you been able to return to your normal activities? Yes.    Do you have any questions about your discharge instructions: Diet   No. Medications  No. Follow up visit  No.  Do you have questions or concerns about your Care? No.  Actions: * If pain score is 4 or above: No action needed, pain <4.

## 2014-08-25 ENCOUNTER — Encounter: Payer: Self-pay | Admitting: Internal Medicine

## 2014-10-27 ENCOUNTER — Other Ambulatory Visit: Payer: Self-pay | Admitting: Internal Medicine

## 2014-11-20 ENCOUNTER — Ambulatory Visit (INDEPENDENT_AMBULATORY_CARE_PROVIDER_SITE_OTHER): Payer: Medicare Other | Admitting: Internal Medicine

## 2014-11-20 ENCOUNTER — Encounter: Payer: Self-pay | Admitting: Internal Medicine

## 2014-11-20 VITALS — BP 138/66 | HR 65 | Temp 97.6°F | Wt 180.2 lb

## 2014-11-20 DIAGNOSIS — L282 Other prurigo: Secondary | ICD-10-CM

## 2014-11-20 DIAGNOSIS — L298 Other pruritus: Secondary | ICD-10-CM

## 2014-11-20 DIAGNOSIS — I10 Essential (primary) hypertension: Secondary | ICD-10-CM | POA: Diagnosis not present

## 2014-11-20 DIAGNOSIS — R0600 Dyspnea, unspecified: Secondary | ICD-10-CM | POA: Diagnosis not present

## 2014-11-20 MED ORDER — HYDROCHLOROTHIAZIDE 25 MG PO TABS: 25.0000 mg | ORAL_TABLET | Freq: Every day | ORAL | Status: DC

## 2014-11-20 MED ORDER — FLUOCINONIDE 0.05 % EX CREA: 1.0000 "application " | TOPICAL_CREAM | Freq: Two times a day (BID) | CUTANEOUS | Status: DC

## 2014-11-20 NOTE — Assessment & Plan Note (Signed)
Patient presented with an acute worsening of a chronic rash on his left popliteal fossa. Rash has been present for the last 5 years, no previous outbreaks. Patient reports rash has become pruritic and is scratching to the point of bleeding. Neosporin relieves the pruritis. He was prescribed topical steroids (hydrocortisone 1%) back in June 2015 which seemed to improve the rash, but he soon developed acute pancreatitis and the steroids were stopped. He has not had any symptoms concerning for acute pancreatitis since that time. Patient also with significantly elevated triglycerides (690) a few weeks after hospitalization which could be related to the episode of acute pancreatitis. His rash appears moist which is concerning for a fungal infection, but this should have improved with previous steroid treatment. Differential also includes eczema (would improve with steroids too) vs psoriasis. Unlikely to be from environmental cause as has been present for the past 5 years.  - Prescribed Lidex topical cream, to be used twice daily - Referral to dermatology  - Instructed to call clinic if symptoms worsen

## 2014-11-20 NOTE — Assessment & Plan Note (Signed)
BP Readings from Last 3 Encounters:  11/20/14 138/66  08/20/14 134/84  06/03/14 129/64    Lab Results  Component Value Date   NA 136 02/17/2014   K 4.3 02/17/2014   CREATININE 0.99 02/17/2014    Assessment: Blood pressure control: controlled Progress toward BP goal:  at goal Comments: BP well controlled.   Plan: Medications:  continue current medications Other plans:  - Continue HCTZ 25 mg daily

## 2014-11-20 NOTE — Progress Notes (Signed)
Internal Medicine Clinic Attending  I saw and evaluated the patient.  I personally confirmed the key portions of the history and exam documented by Dr. Beckie Saltsivet and I reviewed pertinent patient test results.  The assessment, diagnosis, and plan were formulated together and I agree with the documentation in the resident's note. I would be hesitant [facility] due to location of lesion in knee popliteal fossa and constant movement of skin and wound healing. Agree with referral to derm. In meantime, more potent topical steroid.

## 2014-11-20 NOTE — Progress Notes (Signed)
   Subjective:    Patient ID: David Cantu, male    DOB: 08/26/1955, 59 y.o.   MRN: [id number]  HPI [name] is a 59yo man with PMHx of HTN, HLD, and pancreatitis who presents today with a rash on his left popliteal fossa. Patient reports the rash has been chronic, starting almost 5 years ago but has been worsening over the past few weeks. He reports he was prescribed a topical steroid cream (hydrocortisone 1%) back in June 2015. This seemed to help a little, but his mother passed away shortly after developing the rash and then was hospitalized with acute pancreatitis the following week. His acute pancreatitis was thought to be secondary to the steroids and his elevated triglycerides so the steroid cream was stopped. Patient notes the rash has continued to worsen and is now pruritic and spreading to his right popliteal fossa. He states he has been scratching to the point of bleeding. He reports using neosporin over the rash which alleviates the pruritis.   Patient also still with dyspnea on exertion. Patient has been active, walking 2 miles daily. He notes DOE when exercising on the treadmill and elliptical machine that seems out of proportion to what he is capable of doing. He also notes dyspnea when going up the stairs for multiple trips. He denies chest pain, palpitations, orthopnea, PND, wheezing, and swelling of his legs.    Review of Systems General: Denies fever, chills, night sweats, changes in weight, changes in appetite HEENT: Denies headaches, ear pain, changes in vision, rhinorrhea, sore throat CV: See above  Pulm: See above  GI: Denies abdominal pain, nausea, vomiting, diarrhea, constipation, melena, hematochezia GU: Denies dysuria, hematuria, frequency Msk: Denies muscle cramps, joint pains Neuro: Denies weakness, numbness, tingling Skin: Denies bruising    Objective:   Physical Exam General: sitting up in chair, pleasant, NAD HEENT: [place]/AT, EOMI, mucus membranes  moist CV: RRR, no m/g/r Pulm: CTA bilaterally, breaths non-labored Abd: BS+, soft, non-tender, non-distended  Ext: warm, no edema. There is a moist, mildly erythematous appearing rash on the left popliteal fossa. There appears to be dried skin overlying the rash, scaly appearing. The right popliteal fossa has erythematous/purple skin changes that also has a moist appearance.   Neuro: alert and oriented x 3, no focal deficits     Assessment & Plan:  Please refer to A&P documentation.

## 2014-11-20 NOTE — Assessment & Plan Note (Addendum)
Patient with continued dyspnea on exertion. No other signs or symptoms concerning for heart failure. However, patient is significantly affected by this as he feels his exercise capacity is limited due to dyspnea. There may be some component of deconditioning but patient exercises daily. Due to his persistent dyspnea and long history of previously untreated hypertension, I believe an echocardiogram is indicated to assess his cardiac function.  - 2D echocardiogram ordered - f/u results

## 2014-11-20 NOTE — Patient Instructions (Signed)
It was a pleasure seeing you today, [name].   1. Left knee rash - Start Lidex cream. Apply twice daily - Referral to dermatology- will call you to schedule an appointment   2. Dyspnea on exertion - Echocardiogram ordered - You will be called to schedule an appointment   General Instructions:   Please bring your medicines with you each time you come to clinic.  Medicines may include prescription medications, over-the-counter medications, herbal remedies, eye drops, vitamins, or other pills.   Progress Toward Treatment Goals:  Treatment Goal 06/03/2014  Blood pressure at goal    Self Care Goals & Plans:  Self Care Goal 11/20/2014  Manage my medications bring my medications to every visit; refill my medications on time; take my medicines as prescribed  Monitor my health keep track of my blood pressure  Eat healthy foods eat more vegetables; eat foods that are low in salt; eat baked foods instead of fried foods  Be physically active find an activity I enjoy; take a walk every day    No flowsheet data found.   Care Management & Community Referrals:  Referral 06/03/2014  Referrals made for care management support none needed

## 2014-12-02 ENCOUNTER — Ambulatory Visit (HOSPITAL_COMMUNITY): Payer: Medicare Other

## 2014-12-22 ENCOUNTER — Ambulatory Visit (HOSPITAL_COMMUNITY)
Admission: RE | Admit: 2014-12-22 | Discharge: 2014-12-22 | Disposition: A | Discharge status: Home / Self Care | Payer: Medicare Other | Source: Ambulatory Visit | Attending: Internal Medicine | Admitting: Internal Medicine

## 2014-12-22 ENCOUNTER — Other Ambulatory Visit: Payer: Self-pay | Admitting: Internal Medicine

## 2014-12-22 DIAGNOSIS — R0600 Dyspnea, unspecified: Secondary | ICD-10-CM

## 2014-12-22 NOTE — Progress Notes (Signed)
*  PRELIMINARY RESULTS* Echocardiogram 2D Echocardiogram has been performed.  David Cantu, David Cantu 12/22/2014, 2:17 PM

## 2014-12-25 ENCOUNTER — Telehealth: Payer: Self-pay | Admitting: Internal Medicine

## 2014-12-25 NOTE — Telephone Encounter (Signed)
Tried calling patient with echo results. No answer. Will try again tomorrow.

## 2015-01-14 ENCOUNTER — Telehealth: Payer: Self-pay | Admitting: *Deleted

## 2015-01-14 NOTE — Telephone Encounter (Signed)
Pt calls and ask for results of his echo in may, he states the doctor was supposed to call him and didn't, he doesn't know if something is wrong or not

## 2015-01-15 NOTE — Telephone Encounter (Signed)
Discussed echo results with patient. Patient is still experiencing dyspnea and reports his activity is even more limited now. Will have front desk schedule appointment for patient in next few weeks.

## 2015-02-03 ENCOUNTER — Ambulatory Visit (HOSPITAL_COMMUNITY)
Admission: RE | Admit: 2015-02-03 | Discharge: 2015-02-03 | Disposition: A | Discharge status: Home / Self Care | Payer: Medicare Other | Source: Ambulatory Visit | Attending: Internal Medicine | Admitting: Internal Medicine

## 2015-02-03 ENCOUNTER — Encounter: Payer: Self-pay | Admitting: Internal Medicine

## 2015-02-03 ENCOUNTER — Ambulatory Visit (INDEPENDENT_AMBULATORY_CARE_PROVIDER_SITE_OTHER): Payer: Medicare Other | Admitting: Internal Medicine

## 2015-02-03 VITALS — BP 137/73 | HR 65 | Temp 97.9°F | Ht 66.0 in | Wt 181.0 lb

## 2015-02-03 DIAGNOSIS — I10 Essential (primary) hypertension: Secondary | ICD-10-CM | POA: Diagnosis not present

## 2015-02-03 DIAGNOSIS — L282 Other prurigo: Secondary | ICD-10-CM

## 2015-02-03 DIAGNOSIS — E785 Hyperlipidemia, unspecified: Secondary | ICD-10-CM | POA: Diagnosis not present

## 2015-02-03 DIAGNOSIS — R0602 Shortness of breath: Secondary | ICD-10-CM | POA: Insufficient documentation

## 2015-02-03 DIAGNOSIS — R0600 Dyspnea, unspecified: Secondary | ICD-10-CM | POA: Diagnosis not present

## 2015-02-03 DIAGNOSIS — L299 Pruritus, unspecified: Secondary | ICD-10-CM | POA: Diagnosis not present

## 2015-02-03 DIAGNOSIS — M25511 Pain in right shoulder: Secondary | ICD-10-CM | POA: Insufficient documentation

## 2015-02-03 DIAGNOSIS — M25512 Pain in left shoulder: Secondary | ICD-10-CM

## 2015-02-03 MED ORDER — GEMFIBROZIL 600 MG PO TABS: 600.0000 mg | ORAL_TABLET | Freq: Two times a day (BID) | ORAL | Status: DC
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
## 2015-02-03 NOTE — Assessment & Plan Note (Addendum)
BP Readings from Last 3 Encounters:  02/03/15 137/73  11/20/14 138/66  08/20/14 134/84    Lab Results  Component Value Date   NA 136 02/17/2014   K 4.3 02/17/2014   CREATININE 0.99 02/17/2014    Assessment: Blood pressure control: controlled Progress toward BP goal:  at goal Comments: BP well controlled.   Plan: Medications:  Continue HCTZ 25 mg daily.  Educational resources provided: brochure, handout, video  Other: bmet today

## 2015-02-03 NOTE — Patient Instructions (Signed)
It was a pleasure taking care of you today, [name].  Shortness of breath - Blood work today - Chest x-ray - Pulmonary function tests  Shoulder pain - Referral to orthopedic surgery, will schedule will different surgeon  General Instructions:   Thank you for bringing your medicines today. This helps us keep you safe from mistakes.   Progress Toward Treatment Goals:  Treatment Goal 11/20/2014  Blood pressure at goal    Self Care Goals & Plans:  Self Care Goal 02/03/2015  Manage my medications take my medicines as prescribed; bring my medications to every visit; refill my medications on time; follow the sick day instructions if I am sick  Monitor my health keep track of my blood pressure; keep track of my weight  Eat healthy foods eat more vegetables; eat fruit for snacks and desserts; eat baked foods instead of fried foods; eat smaller portions  Be physically active find an activity I enjoy    No flowsheet data found.   Care Management & Community Referrals:  Referral 06/03/2014  Referrals made for care management support none needed

## 2015-02-03 NOTE — Assessment & Plan Note (Signed)
Patient complained of bilateral shoulder pain today. He has had a previous rotator cuff repair on his left shoulder in 2009. He reports difficulty in lifting heavy objects lately and playing with his grandchildren. He reports taking Aleve once daily and Aleve PM about once every 3 days. He notes [facility]. Patient states he completed 4 months of physical therapy at that time. Patient states he would like a second opinion about his left shoulder and also evaluation for his right shoulder.  - Referral placed to [place] for second opinion  - Recommended to continue Aleve for pain and ice/heat

## 2015-02-03 NOTE — Assessment & Plan Note (Signed)
Last lipid panel in Nov 2015. Patient is on gemfibrozil 600 mg BID.  - Check lipid panel today - Continue gemfibrozil
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
## 2015-02-03 NOTE — Assessment & Plan Note (Signed)
Patient had a pruritic rash on the back of his knees at his last visit which has now resolved. He was prescribed Lidex topical cream which he said cleared up the rash before he even went to the dermatologist. The dermatologist felt that the rash was likely an atopic dermatitis. Patient was prescribed another Lidex topical cream in case the rash comes back.  - Continue to monitor

## 2015-02-04 LAB — CBC
HCT: 40.7 % (ref 39.0–52.0)
Hemoglobin: 14 g/dL (ref 13.0–17.0)
MCH: 28.5 pg (ref 26.0–34.0)
MCHC: 34.4 g/dL (ref 30.0–36.0)
MCV: 82.7 fL (ref 78.0–100.0)
MPV: 10.4 fL (ref 8.6–12.4)
PLATELETS: 236 10*3/uL (ref 150–400)
RBC: 4.92 MIL/uL (ref 4.22–5.81)
RDW: 13.7 % (ref 11.5–15.5)
WBC: 5.7 10*3/uL (ref 4.0–10.5)

## 2015-02-04 LAB — BASIC METABOLIC PANEL WITH GFR
BUN: 24 mg/dL — AB (ref 6–23)
CALCIUM: 10.2 mg/dL (ref 8.4–10.5)
CO2: 32 mEq/L (ref 19–32)
CREATININE: 1.22 mg/dL (ref 0.50–1.35)
Chloride: 96 mEq/L (ref 96–112)
GFR, Est African American: 74 mL/min
GFR, Est Non African American: 64 mL/min
GLUCOSE: 99 mg/dL (ref 70–99)
Potassium: 3.8 mEq/L (ref 3.5–5.3)
Sodium: 139 mEq/L (ref 135–145)

## 2015-02-04 LAB — LIPID PANEL
Cholesterol: 227 mg/dL — ABNORMAL HIGH (ref 0–200)
HDL: 41 mg/dL (ref >=40)
LDL CALC: 113 mg/dL — AB (ref 0–99)
TRIGLYCERIDES: 366 mg/dL — AB (ref <150)
Total CHOL/HDL Ratio: 5.5 Ratio
VLDL: 73 mg/dL — AB (ref 0–40)

## 2015-02-09 NOTE — Progress Notes (Signed)
Internal Medicine Clinic Attending  Case discussed with Dr. Rivet soon after the resident saw the patient.  We reviewed the resident's history and exam and pertinent patient test results.  I agree with the assessment, diagnosis, and plan of care documented in the resident's note.  

## 2015-02-25 ENCOUNTER — Ambulatory Visit (HOSPITAL_COMMUNITY)
Admission: RE | Admit: 2015-02-25 | Discharge: 2015-02-25 | Disposition: A | Discharge status: Home / Self Care | Payer: Medicare Other | Source: Ambulatory Visit | Attending: Internal Medicine | Admitting: Internal Medicine

## 2015-02-25 DIAGNOSIS — R0600 Dyspnea, unspecified: Secondary | ICD-10-CM | POA: Diagnosis not present

## 2015-02-25 LAB — PULMONARY FUNCTION TEST
DL/VA % pred: 82 %
DL/VA: 3.59 ml/min/mmHg/L
DLCO unc % pred: 58 %
DLCO unc: 15.89 ml/min/mmHg
FEF 25-75 Post: 3.53 L/s
FEF 25-75 Pre: 3.4 L/s
FEF2575-%Change-Post: 3 %
FEF2575-%Pred-Post: 137 %
FEF2575-%Pred-Pre: 132 %
FEV1-%Change-Post: 0 %
FEV1-%Pred-Post: 92 %
FEV1-%Pred-Pre: 92 %
FEV1-Post: 2.86 L
FEV1-Pre: 2.86 L
FEV1FVC-%Change-Post: 4 %
FEV1FVC-%Pred-Pre: 110 %
FEV6-%Change-Post: -4 %
FEV6-%Pred-Post: 84 %
FEV6-%Pred-Pre: 88 %
FEV6-Post: 3.27 L
FEV6-Pre: 3.43 L
FEV6FVC-%Pred-Post: 105 %
FEV6FVC-%Pred-Pre: 105 %
FVC-%Change-Post: -4 %
FVC-%Pred-Post: 79 %
FVC-%Pred-Pre: 83 %
FVC-Post: 3.27 L
FVC-Pre: 3.43 L
Post FEV1/FVC ratio: 87 %
Post FEV6/FVC ratio: 100 %
Pre FEV1/FVC ratio: 83 %
Pre FEV6/FVC Ratio: 100 %
RV % pred: 85 %
RV: 1.74 L
TLC % pred: 80 %
TLC: 4.98 L

## 2015-02-25 MED ORDER — ALBUTEROL SULFATE (2.5 MG/3ML) 0.083% IN NEBU
2.5000 mg | INHALATION_SOLUTION | Freq: Once | RESPIRATORY_TRACT | Status: AC
  Administered 2015-02-25: 2.5 mg via RESPIRATORY_TRACT

## 2015-03-08 ENCOUNTER — Encounter: Payer: Self-pay | Admitting: Internal Medicine

## 2015-03-19 ENCOUNTER — Other Ambulatory Visit: Payer: Self-pay | Admitting: Internal Medicine

## 2015-03-19 ENCOUNTER — Encounter: Payer: Self-pay | Admitting: Internal Medicine

## 2015-03-19 DIAGNOSIS — R0600 Dyspnea, unspecified: Secondary | ICD-10-CM

## 2015-03-19 NOTE — Telephone Encounter (Signed)
Spoke with patient's wife about results of PFTs. Made aware that patient will need V/Q scan. This was ordered.

## 2015-03-25 ENCOUNTER — Ambulatory Visit (HOSPITAL_COMMUNITY)
Admission: RE | Admit: 2015-03-25 | Discharge: 2015-03-25 | Disposition: A | Discharge status: Home / Self Care | Payer: Medicare Other | Source: Ambulatory Visit | Attending: Internal Medicine | Admitting: Internal Medicine

## 2015-03-25 DIAGNOSIS — R0600 Dyspnea, unspecified: Secondary | ICD-10-CM

## 2015-03-25 MED ORDER — TECHNETIUM TC 99M DIETHYLENETRIAME-PENTAACETIC ACID: 38.5000 | Freq: Once | INTRAVENOUS | Status: DC | PRN

## 2015-03-25 MED ORDER — TECHNETIUM TO 99M ALBUMIN AGGREGATED
5.7000 | Freq: Once | INTRAVENOUS | Status: AC | PRN
  Administered 2015-03-25: 6 via INTRAVENOUS

## 2015-03-31 ENCOUNTER — Telehealth: Payer: Self-pay | Admitting: Internal Medicine

## 2015-03-31 DIAGNOSIS — R0600 Dyspnea, unspecified: Secondary | ICD-10-CM

## 2015-03-31 NOTE — Telephone Encounter (Signed)
Called patient with normal V/Q scan results. Explained next steps are to do cardiopulmonary exercise test to evaluate his dyspnea. Patient agrees with plan. Order placed.

## 2015-04-08 ENCOUNTER — Telehealth: Payer: Self-pay | Admitting: Internal Medicine

## 2015-04-08 ENCOUNTER — Encounter: Payer: Self-pay | Admitting: *Deleted

## 2015-04-08 NOTE — Telephone Encounter (Signed)
Patient has been sch for his CPX on 04/14/2015 @ 3 pm arrival time is @ 2:45pm @ Admitting.  NO prep.  Please inform patient it will be about 2 hours long.  He is to dress comfortable, eat a small meal and take he medications and be prepared to work.

## 2015-04-08 NOTE — Telephone Encounter (Signed)
appt info sent to patient via MyChart.David Cantu, David Cassady9/7/201611:06 AM

## 2015-04-14 ENCOUNTER — Encounter (HOSPITAL_COMMUNITY): Payer: Medicare Other

## 2015-04-22 ENCOUNTER — Ambulatory Visit (HOSPITAL_COMMUNITY): Payer: Medicare Other | Attending: Internal Medicine

## 2015-04-22 DIAGNOSIS — R0600 Dyspnea, unspecified: Secondary | ICD-10-CM | POA: Insufficient documentation

## 2015-04-23 DIAGNOSIS — R0600 Dyspnea, unspecified: Secondary | ICD-10-CM | POA: Diagnosis not present

## 2015-05-02 ENCOUNTER — Encounter: Payer: Self-pay | Admitting: Internal Medicine

## 2015-05-04 ENCOUNTER — Telehealth: Payer: Self-pay | Admitting: Internal Medicine

## 2015-05-04 DIAGNOSIS — R0600 Dyspnea, unspecified: Secondary | ICD-10-CM

## 2015-05-04 NOTE — Telephone Encounter (Signed)
Gave patient cardiopulmonary testing results. Will refer to Cardiology to see if further ischemic testing needed as we have not been able to pinpoint a source for his dyspnea on exertion.

## 2015-05-05 ENCOUNTER — Encounter: Payer: Self-pay | Admitting: Internal Medicine

## 2015-05-28 ENCOUNTER — Ambulatory Visit: Payer: Medicare Other | Admitting: Cardiology

## 2015-05-30 IMAGING — CT CT ABD-PELV W/ CM
2 of 5 series · 17 of 46 positions shown, 19 images · IV contrast (omnipaque)
Comparison: None

CLINICAL DATA: Fatigue in for 2 days, mid sternal chest pain,
abdominal pain including stabbing pain in lower quadrants,
right-side tenderness, nausea, dry heaves, history hypertension

EXAM:
CT ABDOMEN AND PELVIS WITH CONTRAST
TECHNIQUE: Multidetector CT imaging of the abdomen and pelvis was performed
using the standard protocol following bolus administration of
intravenous contrast. Sagittal and coronal MPR images reconstructed
from axial data set.
CONTRAST:  80mL OMNIPAQUE IOHEXOL 300 MG/ML SOLN IV. Dilute oral
contrast.

[Series 2: abd/ pelvis 5.0 i30f 1 · axial · 0.81mm/px · z∈[-460,-46]mm · 14 of 93 slices shown, 16 images]
[im 5/93  soft-tissue]
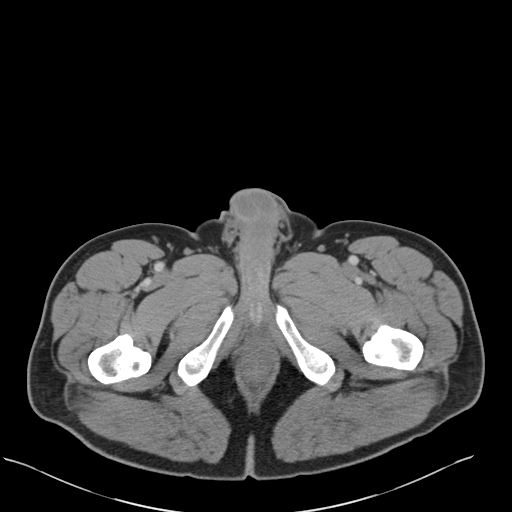
[im 5/93  bone]
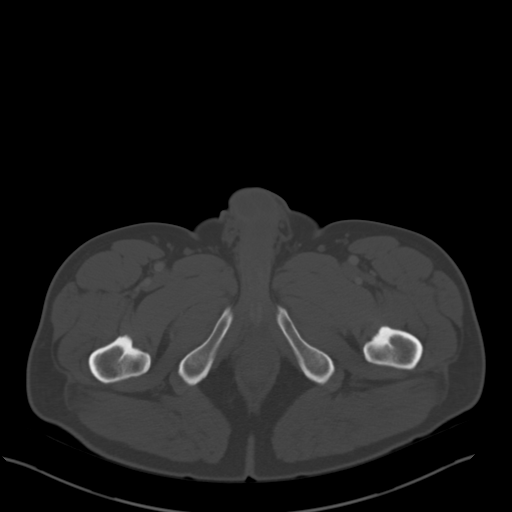
[im 14/93  soft-tissue]
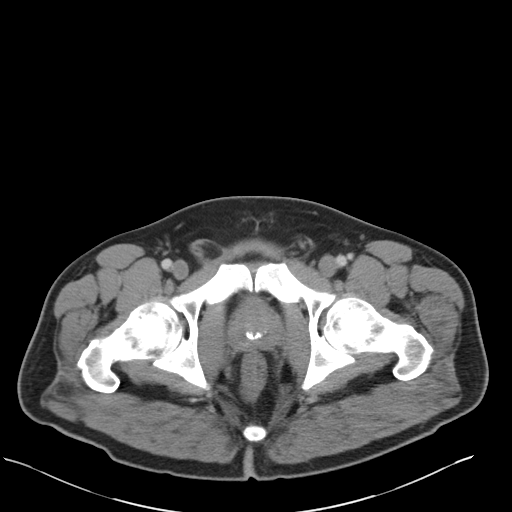
[im 19/93  soft-tissue]
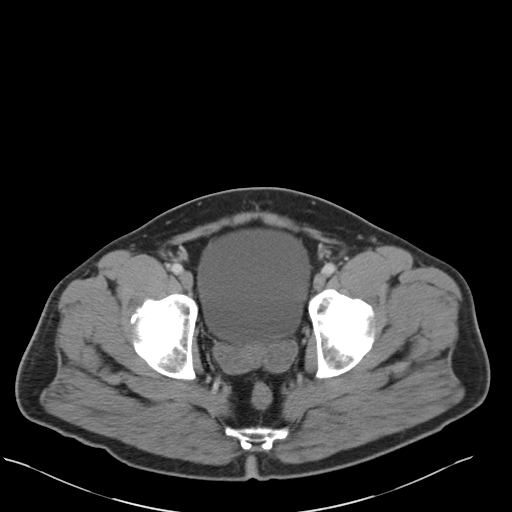
[im 24/93  soft-tissue]
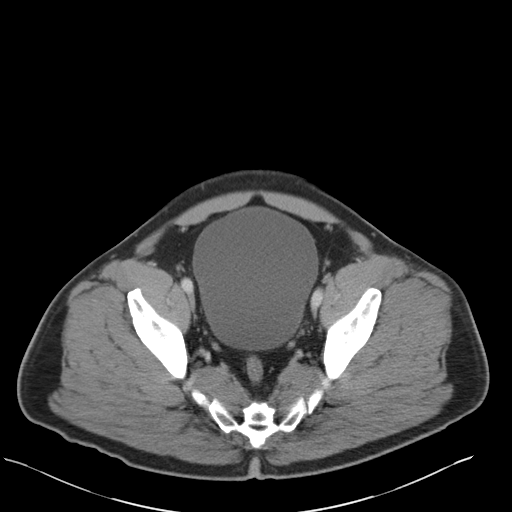
[im 33/93  soft-tissue]
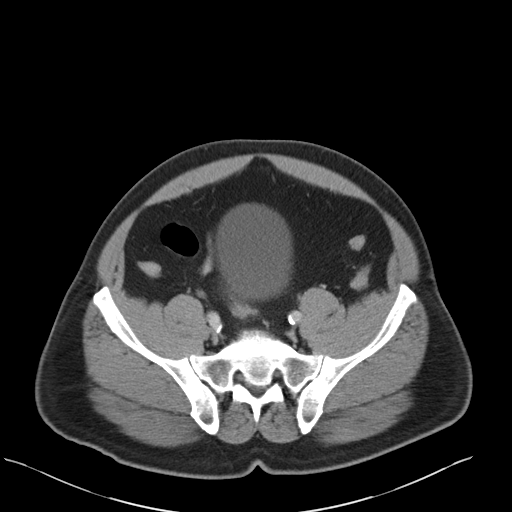
[im 37/93  soft-tissue]
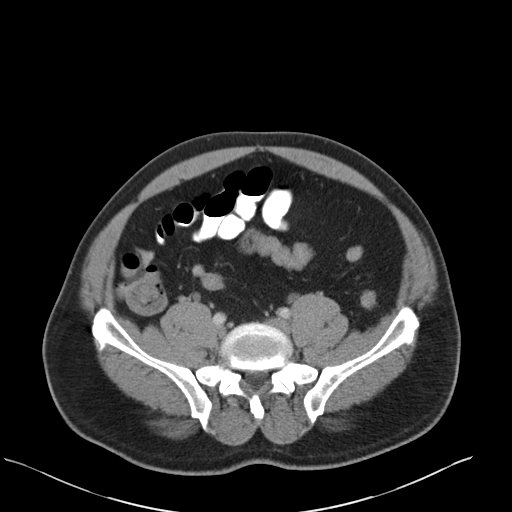
[im 42/93  soft-tissue]
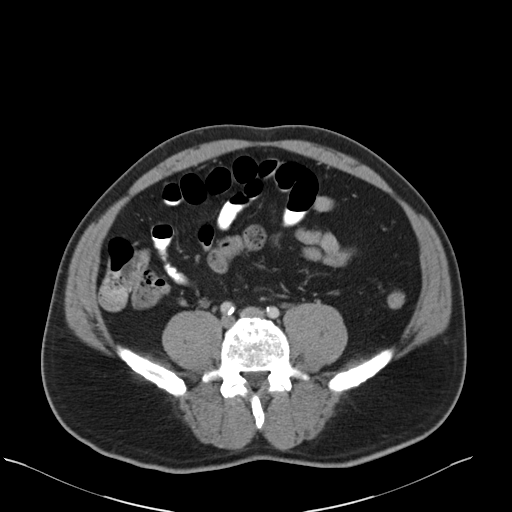
[im 51/93  soft-tissue]
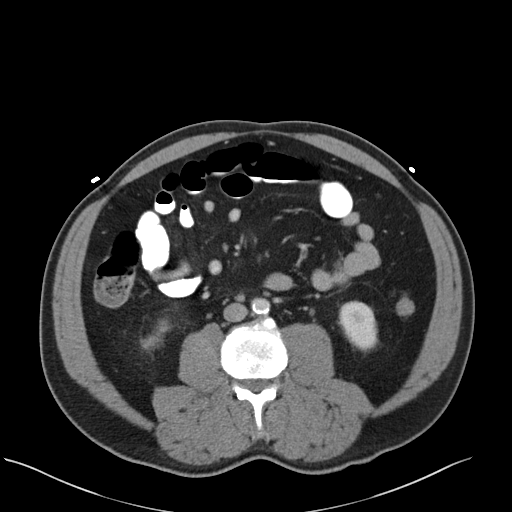
[im 56/93  soft-tissue]
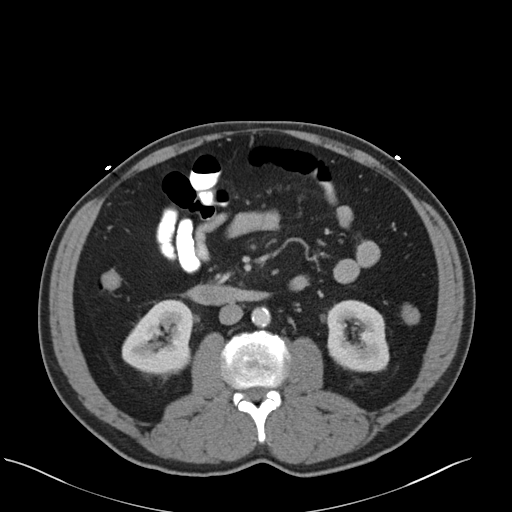
[im 56/93  bone]
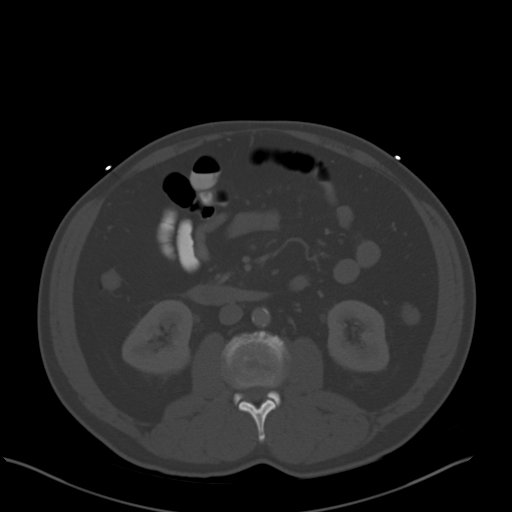
[im 60/93  soft-tissue]
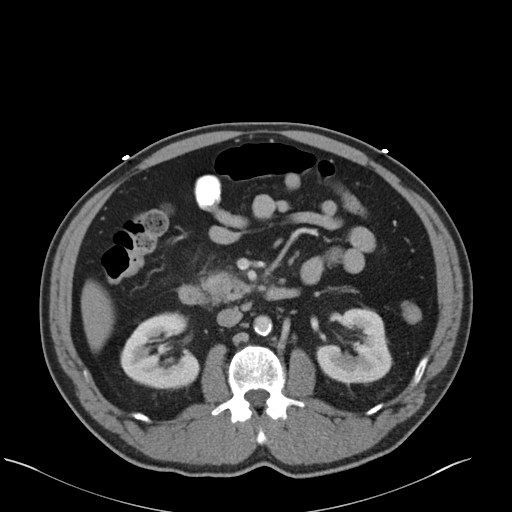
[im 70/93  soft-tissue]
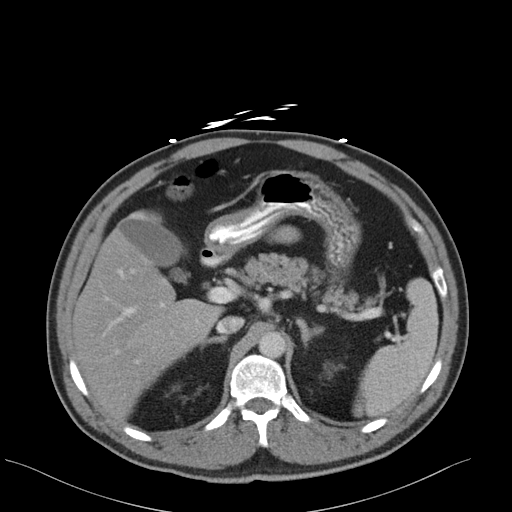
[im 74/93  soft-tissue]
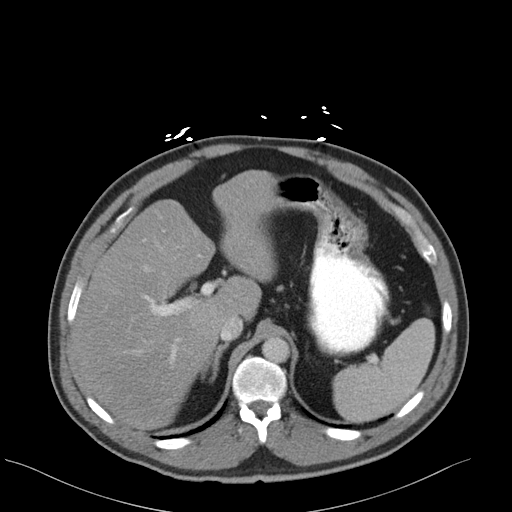
[im 79/93  soft-tissue]
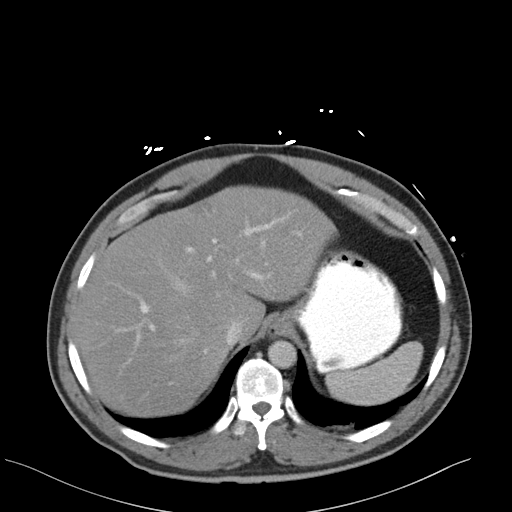
[im 88/93  soft-tissue]
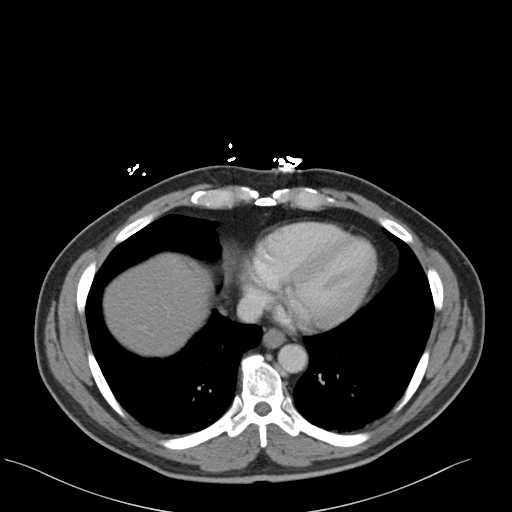

[Series 5: coronals · coronal · 0.89mm/px · 3 of 142 slices shown]
[im 48/142  soft-tissue]
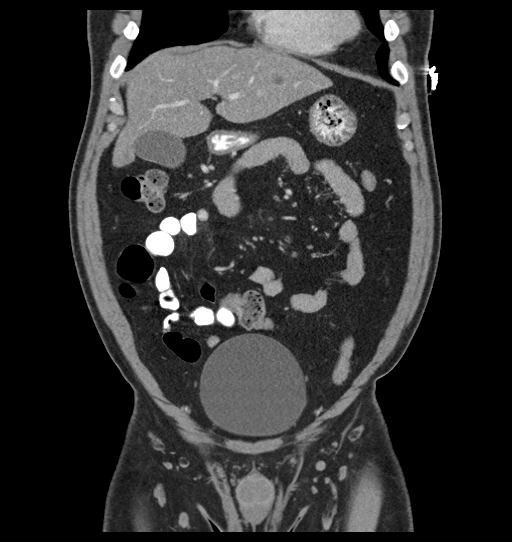
[im 63/142  soft-tissue]
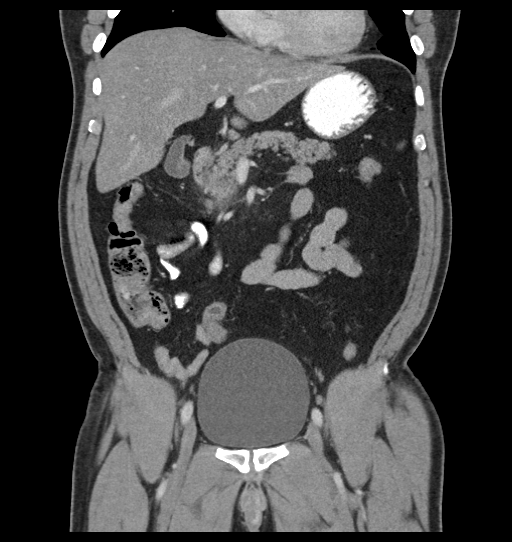
[im 79/142  soft-tissue]
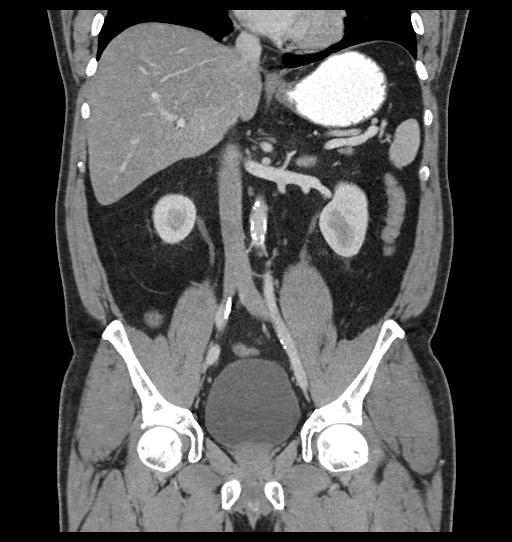

[17 of 46 positions shown; findings below may reference images not displayed]

FINDINGS: Dependent atelectasis at both lung bases.

Mild fatty infiltration of liver.

Probable cyst at lateral segment LEFT lobe liver 15 x 18 mm image
13.

Remainder of liver, spleen, kidneys, and adrenal glands normal
appearance.

Mildly heterogeneous appearance of the pancreatic head with
indistinct margins and minimal adjacent peripancreatic infiltration
favoring focal pancreatitis.

Remainder of pancreas normal in appearance without calcification,
mass or ductal dilatation.

No pancreatitis associated fluid collections.

Normal appendix.

Stomach and bowel loops normal appearance.

No mass, adenopathy, free fluid or if free air.

Unremarkable bladder in ureters.

No hernia or acute bone lesions.
IMPRESSION: Suspected acute focal pancreatitis involving the pancreatic head as
above.

Mild fatty infiltration of liver with probable small cyst at lateral
segment LEFT lobe 15 x 18 mm.

## 2015-06-29 ENCOUNTER — Encounter: Payer: Self-pay | Admitting: Cardiology

## 2015-06-29 ENCOUNTER — Ambulatory Visit (INDEPENDENT_AMBULATORY_CARE_PROVIDER_SITE_OTHER): Payer: Medicare Other | Admitting: Cardiology

## 2015-06-29 ENCOUNTER — Encounter: Payer: Self-pay | Admitting: *Deleted

## 2015-06-29 VITALS — BP 126/86 | HR 85 | Ht 66.0 in | Wt 185.8 lb

## 2015-06-29 DIAGNOSIS — Z01818 Encounter for other preprocedural examination: Secondary | ICD-10-CM | POA: Diagnosis not present

## 2015-06-29 DIAGNOSIS — I208 Other forms of angina pectoris: Secondary | ICD-10-CM | POA: Diagnosis not present

## 2015-06-29 DIAGNOSIS — R0600 Dyspnea, unspecified: Secondary | ICD-10-CM | POA: Diagnosis not present

## 2015-06-29 LAB — BASIC METABOLIC PANEL
BUN: 23 mg/dL (ref 7–25)
CHLORIDE: 98 mmol/L (ref 98–110)
CO2: 27 mmol/L (ref 20–31)
CREATININE: 1.28 mg/dL — AB (ref 0.70–1.25)
Calcium: 10.4 mg/dL — ABNORMAL HIGH (ref 8.6–10.3)
Glucose, Bld: 85 mg/dL (ref 65–99)
POTASSIUM: 3.9 mmol/L (ref 3.5–5.3)
Sodium: 136 mmol/L (ref 135–146)

## 2015-06-29 LAB — CBC
HEMATOCRIT: 44.2 % (ref 39.0–52.0)
HEMOGLOBIN: 15.5 g/dL (ref 13.0–17.0)
MCH: 29.2 pg (ref 26.0–34.0)
MCHC: 35.1 g/dL (ref 30.0–36.0)
MCV: 83.2 fL (ref 78.0–100.0)
MPV: 10.3 fL (ref 8.6–12.4)
Platelets: 268 10*3/uL (ref 150–400)
RBC: 5.31 MIL/uL (ref 4.22–5.81)
RDW: 12.9 % (ref 11.5–15.5)
WBC: 7.9 10*3/uL (ref 4.0–10.5)
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
## 2015-06-29 NOTE — Patient Instructions (Signed)
Medication Instructions:  The current medical regimen is effective;  continue present plan and medications.  Labwork: Please have blood work today (BMP, CBC and INR)  Testing/Procedures: Your physician has requested that you have a cardiac catheterization. Cardiac catheterization is used to diagnose and/or treat various heart conditions. Doctors may recommend this procedure for a number of different reasons. The most common reason is to evaluate chest pain. Chest pain can be a symptom of coronary artery disease (CAD), and cardiac catheterization can show whether plaque is narrowing or blocking your heart's arteries. This procedure is also used to evaluate the valves, as well as measure the blood flow and oxygen levels in different parts of your heart. For further information please visit www.cardiosmart.org. Please follow instruction sheet, as given.  Follow-Up: Follow up after your cardiac cath.  If you need a refill on your cardiac medications before your next appointment, please call your pharmacy.  Thank you for choosing [facility]!!     

## 2015-06-30 ENCOUNTER — Other Ambulatory Visit: Payer: Self-pay | Admitting: Cardiology

## 2015-06-30 LAB — PROTIME-INR
INR: 0.89 (ref <1.50)
Prothrombin Time: 12.1 seconds (ref 11.6–15.2)

## 2015-07-06 NOTE — Addendum Note (Signed)
Addended by: Reesa ChewJONES, Militza Devery G on: 07/06/2015 05:33 PM   Modules accepted: Orders

## 2015-07-07 ENCOUNTER — Encounter (HOSPITAL_COMMUNITY): Payer: Self-pay | Admitting: Pharmacy Technician

## 2015-07-07 ENCOUNTER — Telehealth: Payer: Self-pay | Admitting: Cardiology

## 2015-07-07 NOTE — Progress Notes (Signed)
Cath scheduled with Dr Anne FuSkains

## 2015-07-07 NOTE — Telephone Encounter (Signed)
Spoke with pt who is aware he was not ordered to start ASA and it is not necessary for him to do so.

## 2015-07-07 NOTE — Telephone Encounter (Signed)
New message  Pt called states that he has received his paperwork for the  Up coming procedure. It states to be sure an take an Asprin. Pt states that he doesn't normally take any Asprin. So, should he take some before? Please call to clarify.

## 2015-07-09 ENCOUNTER — Encounter (HOSPITAL_COMMUNITY): Payer: Self-pay | Admitting: *Deleted

## 2015-07-09 ENCOUNTER — Ambulatory Visit (HOSPITAL_COMMUNITY)
Admission: RE | Admit: 2015-07-09 | Discharge: 2015-07-10 | Disposition: A | Discharge status: Home / Self Care | Payer: Medicare Other | Source: Ambulatory Visit | Attending: Cardiology | Admitting: Cardiology

## 2015-07-09 ENCOUNTER — Encounter (HOSPITAL_COMMUNITY)
Admission: RE | Disposition: A | Discharge status: Home / Self Care | Payer: Self-pay | Source: Ambulatory Visit | Attending: Cardiology

## 2015-07-09 DIAGNOSIS — I25110 Atherosclerotic heart disease of native coronary artery with unstable angina pectoris: Secondary | ICD-10-CM | POA: Insufficient documentation

## 2015-07-09 DIAGNOSIS — Z23 Encounter for immunization: Secondary | ICD-10-CM | POA: Insufficient documentation

## 2015-07-09 DIAGNOSIS — I200 Unstable angina: Secondary | ICD-10-CM | POA: Diagnosis present

## 2015-07-09 DIAGNOSIS — Z955 Presence of coronary angioplasty implant and graft: Secondary | ICD-10-CM

## 2015-07-09 DIAGNOSIS — E785 Hyperlipidemia, unspecified: Secondary | ICD-10-CM | POA: Insufficient documentation

## 2015-07-09 DIAGNOSIS — Z79899 Other long term (current) drug therapy: Secondary | ICD-10-CM | POA: Insufficient documentation

## 2015-07-09 DIAGNOSIS — I119 Hypertensive heart disease without heart failure: Secondary | ICD-10-CM | POA: Diagnosis not present

## 2015-07-09 DIAGNOSIS — R0600 Dyspnea, unspecified: Secondary | ICD-10-CM | POA: Diagnosis present

## 2015-07-09 HISTORY — PX: CARDIAC CATHETERIZATION: SHX172

## 2015-07-09 HISTORY — DX: Atherosclerotic heart disease of native coronary artery without angina pectoris: I25.10

## 2015-07-09 LAB — POCT I-STAT 3, ART BLOOD GAS (G3+)
Acid-Base Excess: 1 mmol/L (ref 0.0–2.0)
Acid-Base Excess: 2 mmol/L (ref 0.0–2.0)
Bicarbonate: 24.9 mEq/L — ABNORMAL HIGH (ref 20.0–24.0)
Bicarbonate: 27.6 mEq/L — ABNORMAL HIGH (ref 20.0–24.0)
O2 Saturation: 70 %
O2 Saturation: 97 %
PCO2 ART: 37.2 mmHg (ref 35.0–45.0)
PCO2 ART: 44.1 mmHg (ref 35.0–45.0)
PH ART: 7.433 (ref 7.350–7.450)
TCO2: 26 mmol/L (ref 0–100)
TCO2: 29 mmol/L (ref 0–100)
pH, Arterial: 7.405 (ref 7.350–7.450)
pO2, Arterial: 37 mmHg — CL (ref 80.0–100.0)
pO2, Arterial: 91 mmHg (ref 80.0–100.0)

## 2015-07-09 LAB — POCT ACTIVATED CLOTTING TIME
ACTIVATED CLOTTING TIME: 245 s
ACTIVATED CLOTTING TIME: 245 s
ACTIVATED CLOTTING TIME: 183 s
Activated Clotting Time: 199 seconds

## 2015-07-09 LAB — POCT I-STAT 3, VENOUS BLOOD GAS (G3P V)
ACID-BASE EXCESS: 2 mmol/L (ref 0.0–2.0)
ACID-BASE EXCESS: 4 mmol/L — AB (ref 0.0–2.0)
Bicarbonate: 27.8 mEq/L — ABNORMAL HIGH (ref 20.0–24.0)
Bicarbonate: 29.1 mEq/L — ABNORMAL HIGH (ref 20.0–24.0)
O2 Saturation: 62 %
O2 Saturation: 64 %
PCO2 VEN: 45 mmHg (ref 45.0–50.0)
PH VEN: 7.398 — AB (ref 7.250–7.300)
PH VEN: 7.419 — AB (ref 7.250–7.300)
PO2 VEN: 33 mmHg (ref 30.0–45.0)
PO2 VEN: 33 mmHg (ref 30.0–45.0)
TCO2: 29 mmol/L (ref 0–100)
TCO2: 30 mmol/L (ref 0–100)
pCO2, Ven: 45 mmHg (ref 45.0–50.0)

## 2015-07-09 SURGERY — RIGHT/LEFT HEART CATH AND CORONARY ANGIOGRAPHY

## 2015-07-09 MED ORDER — SODIUM CHLORIDE 0.9 % IV SOLN: 250.0000 mL | INTRAVENOUS | Status: DC | PRN

## 2015-07-09 MED ORDER — HEPARIN SODIUM (PORCINE) 1000 UNIT/ML IJ SOLN
INTRAMUSCULAR | Status: AC
  Filled 2015-07-09: qty 1

## 2015-07-09 MED ORDER — VERAPAMIL HCL 2.5 MG/ML IV SOLN
INTRAVENOUS | Status: DC | PRN
  Administered 2015-07-09: 12:00:00 via INTRA_ARTERIAL

## 2015-07-09 MED ORDER — SODIUM CHLORIDE 0.9 % IJ SOLN: 3.0000 mL | Freq: Two times a day (BID) | INTRAMUSCULAR | Status: DC

## 2015-07-09 MED ORDER — HEPARIN SODIUM (PORCINE) 1000 UNIT/ML IJ SOLN
INTRAMUSCULAR | Status: DC | PRN
  Administered 2015-07-09: 4000 [IU] via INTRAVENOUS
  Administered 2015-07-09: 2000 [IU] via INTRAVENOUS

## 2015-07-09 MED ORDER — FENTANYL CITRATE (PF) 100 MCG/2ML IJ SOLN
INTRAMUSCULAR | Status: DC | PRN
  Administered 2015-07-09: 25 ug via INTRAVENOUS

## 2015-07-09 MED ORDER — IOHEXOL 350 MG/ML SOLN
INTRAVENOUS | Status: DC | PRN
  Administered 2015-07-09: 125 mL via INTRA_ARTERIAL

## 2015-07-09 MED ORDER — SODIUM CHLORIDE 0.9 % WEIGHT BASED INFUSION
3.0000 mL/kg/h | INTRAVENOUS | Status: DC
  Administered 2015-07-09: 3 mL/kg/h via INTRAVENOUS

## 2015-07-09 MED ORDER — PNEUMOCOCCAL VAC POLYVALENT 25 MCG/0.5ML IJ INJ
0.5000 mL | INJECTION | INTRAMUSCULAR | Status: AC
  Administered 2015-07-10: 12:00:00 0.5 mL via INTRAMUSCULAR
  Filled 2015-07-09: qty 0.5

## 2015-07-09 MED ORDER — ANGIOPLASTY BOOK
Freq: Once | Status: AC
  Administered 2015-07-09: 21:00:00
  Filled 2015-07-09: qty 1

## 2015-07-09 MED ORDER — SODIUM CHLORIDE 0.9 % IJ SOLN: 3.0000 mL | INTRAMUSCULAR | Status: DC | PRN

## 2015-07-09 MED ORDER — ATORVASTATIN CALCIUM 20 MG PO TABS
20.0000 mg | ORAL_TABLET | Freq: Every day | ORAL | Status: DC
  Administered 2015-07-09: 17:00:00 20 mg via ORAL
  Filled 2015-07-09 (×2): qty 1

## 2015-07-09 MED ORDER — CLOPIDOGREL BISULFATE 300 MG PO TABS
ORAL_TABLET | ORAL | Status: AC
  Filled 2015-07-09: qty 2

## 2015-07-09 MED ORDER — FENTANYL CITRATE (PF) 100 MCG/2ML IJ SOLN
INTRAMUSCULAR | Status: AC
  Filled 2015-07-09: qty 2

## 2015-07-09 MED ORDER — ASPIRIN 81 MG PO CHEW
81.0000 mg | CHEWABLE_TABLET | Freq: Every day | ORAL | Status: DC
  Administered 2015-07-10: 12:00:00 81 mg via ORAL
  Filled 2015-07-09: qty 1

## 2015-07-09 MED ORDER — CLOPIDOGREL BISULFATE 300 MG PO TABS
ORAL_TABLET | ORAL | Status: DC | PRN
  Administered 2015-07-09: 600 mg via ORAL

## 2015-07-09 MED ORDER — ACETAMINOPHEN 325 MG PO TABS
650.0000 mg | ORAL_TABLET | ORAL | Status: DC | PRN
  Administered 2015-07-09: 650 mg via ORAL
  Filled 2015-07-09: qty 2

## 2015-07-09 MED ORDER — NITROGLYCERIN 1 MG/10 ML FOR IR/CATH LAB
INTRA_ARTERIAL | Status: AC
  Filled 2015-07-09: qty 10

## 2015-07-09 MED ORDER — NITROGLYCERIN 1 MG/10 ML FOR IR/CATH LAB
INTRA_ARTERIAL | Status: DC | PRN
  Administered 2015-07-09: 13:00:00

## 2015-07-09 MED ORDER — MIDAZOLAM HCL 2 MG/2ML IJ SOLN
INTRAMUSCULAR | Status: DC | PRN
  Administered 2015-07-09 (×2): 1 mg via INTRAVENOUS
  Administered 2015-07-09: 2 mg via INTRAVENOUS

## 2015-07-09 MED ORDER — HEPARIN SODIUM (PORCINE) 1000 UNIT/ML IJ SOLN
INTRAMUSCULAR | Status: DC | PRN
  Administered 2015-07-09: 6000 [IU] via INTRAVENOUS

## 2015-07-09 MED ORDER — FENTANYL CITRATE (PF) 100 MCG/2ML IJ SOLN
INTRAMUSCULAR | Status: DC | PRN
  Administered 2015-07-09 (×2): 25 ug via INTRAVENOUS

## 2015-07-09 MED ORDER — ASPIRIN 81 MG PO CHEW
81.0000 mg | CHEWABLE_TABLET | ORAL | Status: AC
  Administered 2015-07-09: 81 mg via ORAL

## 2015-07-09 MED ORDER — MIDAZOLAM HCL 2 MG/2ML IJ SOLN
INTRAMUSCULAR | Status: AC
  Filled 2015-07-09: qty 2

## 2015-07-09 MED ORDER — ONDANSETRON HCL 4 MG/2ML IJ SOLN: 4.0000 mg | Freq: Four times a day (QID) | INTRAMUSCULAR | Status: DC | PRN

## 2015-07-09 MED ORDER — HEPARIN (PORCINE) IN NACL 2-0.9 UNIT/ML-% IJ SOLN
INTRAMUSCULAR | Status: AC
  Filled 2015-07-09: qty 1000

## 2015-07-09 MED ORDER — ADENOSINE 12 MG/4ML IV SOLN
16.0000 mL | Freq: Once | INTRAVENOUS | Status: DC
  Filled 2015-07-09: qty 16

## 2015-07-09 MED ORDER — INFLUENZA VAC SPLIT QUAD 0.5 ML IM SUSY
0.5000 mL | PREFILLED_SYRINGE | INTRAMUSCULAR | Status: AC
Start: 2015-07-10 — End: 2015-07-10
  Administered 2015-07-10: 0.5 mL via INTRAMUSCULAR

## 2015-07-09 MED ORDER — SODIUM CHLORIDE 0.9 % IV SOLN
INTRAVENOUS | Status: DC | PRN
  Administered 2015-07-09: 999 mL via INTRAVENOUS

## 2015-07-09 MED ORDER — CLOPIDOGREL BISULFATE 75 MG PO TABS
75.0000 mg | ORAL_TABLET | Freq: Every day | ORAL | Status: DC
  Administered 2015-07-10: 09:00:00 75 mg via ORAL
  Filled 2015-07-09: qty 1

## 2015-07-09 MED ORDER — LIDOCAINE HCL (PF) 1 % IJ SOLN
INTRAMUSCULAR | Status: AC
  Filled 2015-07-09: qty 30

## 2015-07-09 MED ORDER — SODIUM CHLORIDE 0.9 % IV SOLN: INTRAVENOUS | Status: AC

## 2015-07-09 MED ORDER — ADENOSINE (DIAGNOSTIC) 140MCG/KG/MIN
INTRAVENOUS | Status: DC | PRN
  Administered 2015-07-09: 140 ug/kg/min via INTRAVENOUS

## 2015-07-09 MED ORDER — SODIUM CHLORIDE 0.9 % WEIGHT BASED INFUSION: 1.0000 mL/kg/h | INTRAVENOUS | Status: DC

## 2015-07-09 MED ORDER — VERAPAMIL HCL 2.5 MG/ML IV SOLN
INTRAVENOUS | Status: AC
  Filled 2015-07-09: qty 2

## 2015-07-09 MED ORDER — MIDAZOLAM HCL 2 MG/2ML IJ SOLN
INTRAMUSCULAR | Status: DC | PRN
  Administered 2015-07-09: 1 mg via INTRAVENOUS

## 2015-07-09 MED ORDER — ASPIRIN 81 MG PO CHEW
CHEWABLE_TABLET | ORAL | Status: AC
  Filled 2015-07-09: qty 1

## 2015-07-09 SURGICAL SUPPLY — 29 items
BALLN EMERGE MR 2.5X12 (BALLOONS) ×2
BALLN ~~LOC~~ EMERGE MR 3.25X12 (BALLOONS) ×2
BALLOON EMERGE MR 2.5X12 (BALLOONS) ×1 IMPLANT
BALLOON ~~LOC~~ EMERGE MR 3.25X12 (BALLOONS) ×1 IMPLANT
CATH BALLN WEDGE 5F 110CM (CATHETERS) ×2 IMPLANT
CATH INFINITI 5 FR JL3.5 (CATHETERS) ×2 IMPLANT
CATH INFINITI 5FR ANG PIGTAIL (CATHETERS) ×2 IMPLANT
CATH INFINITI 5FR JL4 (CATHETERS) ×2 IMPLANT
CATH INFINITI JR4 5F (CATHETERS) ×2 IMPLANT
CATH LAUNCHER 5F RADL (CATHETERS) ×1 IMPLANT
CATH MICROCATH NAVVUS (MICROCATHETER) ×1 IMPLANT
CATH VISTA GUIDE 6FR JR4 (CATHETERS) ×2 IMPLANT
CATHETER LAUNCHER 5F RADL (CATHETERS) ×2
DEVICE RAD COMP TR BAND LRG (VASCULAR PRODUCTS) ×2 IMPLANT
GLIDESHEATH SLEND SS 6F .021 (SHEATH) ×2 IMPLANT
GUIDEWIRE .025 260CM (WIRE) ×2 IMPLANT
KIT ENCORE 26 ADVANTAGE (KITS) ×2 IMPLANT
KIT HEART LEFT (KITS) ×2 IMPLANT
KIT HEART RIGHT NAMIC (KITS) ×2 IMPLANT
MICROCATHETER NAVVUS (MICROCATHETER) ×2
NEEDLE PERC ENTRY 21G 2.5CM (NEEDLE) ×2 IMPLANT
PACK CARDIAC CATHETERIZATION (CUSTOM PROCEDURE TRAY) ×2 IMPLANT
SHEATH FAST CATH BRACH 5F 5CM (SHEATH) ×2 IMPLANT
STENT XIENCE ALPINE RX 3.0X15 (Permanent Stent) ×2 IMPLANT
SYR MEDRAD MARK V 150ML (SYRINGE) ×2 IMPLANT
TRANSDUCER W/STOPCOCK (MISCELLANEOUS) ×2 IMPLANT
TUBING CIL FLEX 10 FLL-RA (TUBING) ×2 IMPLANT
WIRE COUGAR XT STRL 190CM (WIRE) ×2 IMPLANT
WIRE SAFE-T 1.5MM-J .035X260CM (WIRE) ×2 IMPLANT
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
## 2015-07-09 NOTE — Interval H&P Note (Signed)
History and Physical Interval Note:  07/09/2015 10:58 AM  Verlene MayerLawrence Weckwerth  has presented today for surgery, with the diagnosis of cp  The various methods of treatment have been discussed with the patient and family. After consideration of risks, benefits and other options for treatment, the patient has consented to  Procedure(s): Right/Left Heart Cath and Coronary Angiography (N/A) as a surgical intervention .  The patient's history has been reviewed, patient examined, no change in status, stable for surgery.  I have reviewed the patient's chart and labs.  Questions were answered to the patient's satisfaction.     SKAINS, MARK

## 2015-07-09 NOTE — Research (Signed)
CADLAD Informed Consent   Subject Name: David Cantu  Subject met inclusion and exclusion criteria.  The informed consent form, study requirements and expectations were reviewed with the subject and questions and concerns were addressed prior to the signing of the consent form.  The subject verbalized understanding of the trail requirements.  The subject agreed to participate in the CADLAD trial and signed the informed consent.  The informed consent was obtained prior to performance of any protocol-specific procedures for the subject.  A copy of the signed informed consent was given to the subject and a copy was placed in the subject's medical record.  Jennifer Knapp 07/09/2015, 9:58 AM  

## 2015-07-09 NOTE — Progress Notes (Signed)
TR BAND REMOVAL  LOCATION:    right radial  DEFLATED PER PROTOCOL:    Yes.    TIME BAND OFF / DRESSING APPLIED:    1830   SITE UPON ARRIVAL:    Level 0  SITE AFTER BAND REMOVAL:    Level 0  CIRCULATION SENSATION AND MOVEMENT:    Within Normal Limits   Yes.    COMMENTS:    

## 2015-07-09 NOTE — Progress Notes (Signed)
24F Brachial sheath removed from right brachial vein by Georgeanna LeaHeather Burggraf, CST.  Pressure held for 10 minutes. Hemostasis obtained. Site level 0. No bleeding or hematoma noted.

## 2015-07-10 ENCOUNTER — Encounter (HOSPITAL_COMMUNITY): Payer: Self-pay | Admitting: Cardiology

## 2015-07-10 DIAGNOSIS — I119 Hypertensive heart disease without heart failure: Secondary | ICD-10-CM | POA: Diagnosis not present

## 2015-07-10 DIAGNOSIS — Z955 Presence of coronary angioplasty implant and graft: Secondary | ICD-10-CM | POA: Diagnosis not present

## 2015-07-10 DIAGNOSIS — E785 Hyperlipidemia, unspecified: Secondary | ICD-10-CM

## 2015-07-10 DIAGNOSIS — I25110 Atherosclerotic heart disease of native coronary artery with unstable angina pectoris: Secondary | ICD-10-CM | POA: Diagnosis not present

## 2015-07-10 DIAGNOSIS — Z23 Encounter for immunization: Secondary | ICD-10-CM | POA: Diagnosis not present

## 2015-07-10 LAB — CBC
HCT: 41.4 % (ref 39.0–52.0)
HEMOGLOBIN: 14.2 g/dL (ref 13.0–17.0)
MCH: 29.2 pg (ref 26.0–34.0)
MCHC: 34.3 g/dL (ref 30.0–36.0)
MCV: 85 fL (ref 78.0–100.0)
Platelets: 203 10*3/uL (ref 150–400)
RBC: 4.87 MIL/uL (ref 4.22–5.81)
RDW: 12.6 % (ref 11.5–15.5)
WBC: 7.5 10*3/uL (ref 4.0–10.5)

## 2015-07-10 LAB — BASIC METABOLIC PANEL
ANION GAP: 8 (ref 5–15)
BUN: 14 mg/dL (ref 6–20)
CALCIUM: 9.5 mg/dL (ref 8.9–10.3)
CO2: 28 mmol/L (ref 22–32)
CREATININE: 1.24 mg/dL (ref 0.61–1.24)
Chloride: 101 mmol/L (ref 101–111)
Glucose, Bld: 119 mg/dL — ABNORMAL HIGH (ref 65–99)
Potassium: 3.8 mmol/L (ref 3.5–5.1)
SODIUM: 137 mmol/L (ref 135–145)

## 2015-07-10 MED ORDER — CARVEDILOL 6.25 MG PO TABS: 6.2500 mg | ORAL_TABLET | Freq: Two times a day (BID) | ORAL | Status: DC

## 2015-07-10 MED ORDER — ASPIRIN 81 MG PO CHEW: 81.0000 mg | CHEWABLE_TABLET | Freq: Every day | ORAL | Status: AC

## 2015-07-10 MED ORDER — PANTOPRAZOLE SODIUM 40 MG PO TBEC: 40.0000 mg | DELAYED_RELEASE_TABLET | Freq: Every day | ORAL | Status: DC

## 2015-07-10 MED ORDER — NITROGLYCERIN 0.4 MG SL SUBL: 0.4000 mg | SUBLINGUAL_TABLET | SUBLINGUAL | Status: DC | PRN

## 2015-07-10 MED ORDER — CARVEDILOL 3.125 MG PO TABS
6.2500 mg | ORAL_TABLET | Freq: Two times a day (BID) | ORAL | Status: DC
  Administered 2015-07-10: 6.25 mg via ORAL
  Filled 2015-07-10: qty 2

## 2015-07-10 MED ORDER — ATORVASTATIN CALCIUM 40 MG PO TABS: 40.0000 mg | ORAL_TABLET | Freq: Every day | ORAL | Status: DC

## 2015-07-10 MED ORDER — CLOPIDOGREL BISULFATE 75 MG PO TABS: 75.0000 mg | ORAL_TABLET | Freq: Every day | ORAL | Status: DC

## 2015-07-10 NOTE — Discharge Summary (Signed)
Discharge Summary   Patient ID: David Cantu,  MRN: [id number], DOB/AGE: 11-22-1955 60 y.o.  Admit date: 07/09/2015 Discharge date: 07/10/2015  Primary Care Provider: Rich Number Primary Cardiologist: Dr. Anne Fu  Discharge Diagnoses Active Problems:   Dyspnea   Coronary artery disease involving native coronary artery of native heart with unstable angina pectoris [facility])   Unstable angina (HCC)   Stented coronary artery   Hypertensive heart disease without heart failure   HL   R should pain    Allergies No Known Allergies  Consultant: None  Procedures  Procedures    Right/Left Heart Cath and Coronary Angiography 07/09/2015    Conclusion     The left ventricular systolic function is normal. Ejection fraction 55-60%  No mitral regurgitation, no aortic stenosis  Normal right-sided heart pressures, no pulmonary hypertension  Normal oxygen saturations within right-sided structures. No evidence of shunt.  Right coronary artery distal lesion of 70% focal. FFR obtained. Please see secondary report. Significant 0.72  DES placement to distal RCA  Postpone elective shoulder surgery for one year.  Donato Schultz, MD   Overnight stay DAPT one year  Dominance: Right   Left Main  Vessel was injected. Vessel is normal in caliber. Vessel is angiographically normal.     Left Anterior Descending  Vessel was injected. Vessel is normal in caliber. Vessel is angiographically normal.     Left Circumflex  Vessel was injected. Vessel is normal in caliber. Vessel is angiographically normal.     Right Coronary Artery  Vessel was injected. Moderate to severe - 60-70% distal RCA stenosis.       Right Heart Pressures Normal right heart cath. RA - 5/4/3 RV - 20/2/3 PA - 20/6/12 Wedge - 7/5/4  PA - 70% RV - 64% RA - 62% AO - 97%  CO - 3.46 l/m CI - 1.8   Left Heart    Left Ventricle The left ventricular size is normal. The  left ventricular systolic function is normal. The left ventricular ejection fraction is 55-65% by visual estimate. There are no wall motion abnormalities in the left ventricle.   Mitral Valve There is no mitral valve regurgitation.   Aortic Valve There is no aortic valve stenosis. No calcification found in the aortic valve. There is normal aortic valve motion.   Aorta The size of the ascending aorta is normal.            History of Present Illness  David Cantu is a 59 y.o. male with hx of shortness of breath with activity which has progressively worsened over the past year to the point with minimal exertional activity he becomes quite significantly short of breath. He has had extensive workup including VQ scan and pulmonary function studies. He also underwent cardiopulmonary stress testing which could not exclude the possibility of cardiac ischemia. VQ scan was normal.Echocardiogram demonstrated normal ejection fraction, no evidence of cardiomyopathy. Evaluated by Dr. Caro Hight 06/29/15 and felt that his progressive and worsening dyspnea on exertion which may be an anginal equivalent and presented for outpatient L&R cath 07/09/15.  He was originally scheduled to have R shoulder surgery 06/30/15 that has been cancelled.    Hospital Course  L&R cath 07/09/15 normal LV function, no MR or AS, normal R heart pressure, No pulmonary HTN, 70% RCA s/p DES. Ambulating well. DAPT with ASA and plavix for at least one year. HCTZ was held on admission. BP has been elevated, so we will start carvedilol 6.25 mg bid instead. We will also  start atorvastatin rather than gemfibrozil.   She has been seen by Dr. Duke Salvia today and deemed ready for discharge home. All follow-up appointments have been scheduled. Discharge medications are listed below.   Consider OP f/u labs 6-8 weeks given statin initiation this admission. Last lipid panel 02/03/2015: Cholesterol 227*; HDL 41; LDL Cholesterol 113*;  Triglycerides 366*; VLDL 73* . Consider repeat labs. Advised to avoid NSAID on DAPT. Will give Protonix. No elective R shoulder surgery until one years, need to be cleared by Dr. Anne Fu. Pt aware    Discharge Vitals Blood pressure 159/76, pulse 82, temperature 98.3 F (36.8 C), temperature source Oral, resp. rate 16, height  (1.676 m), weight 192 lb 0.3 oz (87.1 kg), SpO2 93 %.  Filed Weights   07/09/15 0826 07/10/15 0425  Weight: 183 lb (83.008 kg) 192 lb 0.3 oz (87.1 kg)    Labs  CBC  Recent Labs  07/10/15 0406  WBC 7.5  HGB 14.2  HCT 41.4  MCV 85.0  PLT 203   Basic Metabolic Panel  Recent Labs  07/10/15 0406  NA 137  K 3.8  CL 101  CO2 28  GLUCOSE 119*  BUN 14  CREATININE 1.24  CALCIUM 9.5  Disposition  Pt is being discharged home today in good condition.  Follow-up Plans & Appointments  Follow-up Information    Follow up with Donato Schultz, MD. Go on 07/15/2015.   Specialty:  Cardiology   Why:  :00 pm post hosptial    Contact information:   [address] [place] [address] [phone]           Discharge Instructions    Amb Referral to Cardiac Rehabilitation    Complete by:  As directed   Diagnosis:  PCI     Diet - low sodium heart healthy    Complete by:  As directed      Discharge instructions    Complete by:  As directed   No driving for 48 hours. No lifting over 5 lbs for 1 week. No sexual activity for 1 week. You may return to work on after seen by Dr. Anne Fu Keep procedure site clean & dry. If you notice increased pain, swelling, bleeding or pus, call/return!  You may shower, but no soaking baths/hot tubs/pools for 1 week.   Patients taking aspirin and plavix should generally stay away from medicines like ibuprofen, Advil, Motrin, naproxen, and Aleve due to risk of stomach bleeding. You may take Tylenol as directed or talk to your primary doctor about alternatives.  You may take protonix to prevent GI upset.      Increase activity slowly    Complete by:  As directed            F/u Labs/Studies: as above  Discharge Medications    Medication List    STOP taking these medications        ADVIL PM 200-25 MG Caps  Generic drug:  Ibuprofen-Diphenhydramine HCl     gemfibrozil 600 MG tablet  Commonly known as:  LOPID     hydrochlorothiazide 25 MG tablet  Commonly known as:  HYDRODIURIL      TAKE these medications        aspirin 81 MG chewable tablet  Chew 1 tablet (81 mg total) by mouth daily.     atorvastatin 40 MG tablet  Commonly known as:  LIPITOR  Take 1 tablet (40 mg total) by mouth daily at 6 PM.  carvedilol 6.25 MG tablet  Commonly known as:  COREG  Take 1 tablet (6.25 mg total) by mouth 2 (two) times daily with a meal.     clopidogrel 75 MG tablet  Commonly known as:  PLAVIX  Take 1 tablet (75 mg total) by mouth daily with breakfast.     pantoprazole 40 MG tablet  Commonly known as:  PROTONIX  Take 1 tablet (40 mg total) by mouth daily.        Duration of Discharge Encounter   Greater than 30 minutes including physician time.  Signed, Cabot Cromartie PA-C 07/10/2015, 11:47 AM

## 2015-07-10 NOTE — Discharge Instructions (Addendum)
Coronary Angioplasty, Care After °Refer to this sheet in the next few weeks. These instructions provide you with information about caring for yourself after your procedure. Your health care provider may also give you more specific instructions. Your treatment has been planned according to current medical practices, but problems sometimes occur. Call your health care provider if you have any problems or questions after your procedure. °WHAT TO EXPECT AFTER THE PROCEDURE °After your procedure, it is typical to have the following: °· Bruising at the catheter site that usually fades within 1-2 weeks. °· Blood collecting in the tissue (hematoma) that may be painful to the touch. It should usually decrease in size and tenderness within 1-2 weeks. °HOME CARE INSTRUCTIONS °· Take medicines only as directed by your health care provider. Blood thinners may be prescribed after your procedure to improve blood flow. °· You may shower 24-48 hours after the procedure or as directed by your health care provider. Remove the bandage (dressing) and gently wash the site with plain soap and water. Pat the area dry with a clean towel. Do not rub the site, because this may cause bleeding. °· Do not take baths, swim, or use a hot tub until your health care provider approves. °· Check your insertion site every day for redness, swelling, or drainage. °· Do not apply powder or lotion to the site. °· Do not lift over 10 lb (4.5 kg) for 5 days after your procedure or as directed by your health care provider. °· Ask your health care provider when it is okay to: °¨ Return to work or school. °¨ Resume usual physical activities or sports. °¨ Resume sexual activity. °· Eat a heart-healthy diet. This should include plenty of fresh fruits and vegetables. Meat should be lean cuts. Avoid the following types of food: °¨ Food that is high in salt. °¨ Canned or highly processed food. °¨ Food that is high in saturated fat or sugar. °¨ Fried food. °· Make  any other lifestyle changes as recommended by your health care provider. This may include: °¨ Not using any tobacco products, including cigarettes, chewing tobacco, or electronic cigarettes. If you need help quitting, ask your health care provider. °¨ Managing your weight. °¨ Getting regular exercise. °¨ Managing your blood pressure. °¨ Limiting your alcohol intake. °¨ Managing other health problems, such as diabetes. °· Keep all follow-up visits as directed by your health care provider. This is important. °SEEK MEDICAL CARE IF: °· You have a fever. °· You have chills. °· You have increased bleeding from the catheter insertion site. Hold pressure on the site. °SEEK IMMEDIATE MEDICAL CARE IF: °· You develop chest pain or shortness of breath, feel faint, or pass out. °· You have unusual pain at the catheter insertion site. °· You have redness, warmth, or swelling at the catheter insertion site. °· You have drainage (other than a small amount of blood on the dressing) from the catheter insertion site. °· The catheter insertion site is bleeding, and the bleeding does not stop after 30 minutes of holding steady pressure on the site. °· You develop bleeding from any other place, such as from the rectum. There may be bright red blood in your urine or stool, or it may appear as black, tarry stool. °  °This information is not intended to replace advice given to you by your health care provider. Make sure you discuss any questions you have with your health care provider. °  °Document Released: 02/03/2005 Document Revised: 08/08/2014 Document Reviewed:   03/25/2013 Elsevier Interactive Patient Education 2016 [employer]     MEDICATIONS TO STOP:  STOP TAKING:  GEMFIBROZIL STOP TAKING:  HYDROCHLOROTHIAZIDE STOP TAKING:  ADVIL PM, AND OTHER NSAIDS (IBUPROFEN, MOTRIN, ETC.)

## 2015-07-10 NOTE — Progress Notes (Signed)
CARDIAC REHAB PHASE I   Pt has been ambulating independently in the hallways this morning with no complaints, states he "feels fine." Completed PCI/stent education.  Reviewed risk factors, anti-platelet therapy, stent card, activity restrictions, ntg, exercise, heart healthy diet, portion control, and phase 2 cardiac rehab. Pt verbalized understanding, very receptive to education. Pt agrees to phase 2 cardiac rehab referral, [facility]. Pt in recliner, call bell within reach.  [phone] Joylene GrapesMonge, Prestina Raigoza C, RN, BSN 07/10/2015 10:25 AM

## 2015-07-10 NOTE — Progress Notes (Signed)
Patient Name: David Cantu Date of Encounter: 07/10/2015  Pt Profile:  David Cantu is a 59 y.o. male with hx of shortness of breath with activity which has progressively worsened over the past year to the point with minimal exertional activity he becomes quite significantly short of breath. He has had extensive workup including VQ scan and pulmonary function studies. He also underwent cardiopulmonary stress testing which could not exclude the possibility of cardiac ischemia. VQ scan was normal.Echocardiogram demonstrated normal ejection fraction, no evidence of cardiomyopathy. Evaluated by Dr. Caro HightSkain 06/29/15 and felt that his progressive and worsening dyspnea on exertion which may be an anginal equivalent and presented for outpatient L&R cath 07/09/15.  He was originally scheduled to have R shoulder surgery 06/30/15 that has been cancelled.    SUBJECTIVE  Feeling well. No chest pain, sob or palpitations.   CURRENT MEDS . aspirin  81 mg Oral Daily  . atorvastatin  40 mg Oral q1800  . clopidogrel  75 mg Oral Q breakfast  . Influenza vac split quadrivalent PF  0.5 mL Intramuscular Tomorrow-1000  . pneumococcal 23 valent vaccine  0.5 mL Intramuscular Tomorrow-1000    OBJECTIVE  Filed Vitals:   07/09/15 1900 07/09/15 2000 07/10/15 0425 07/10/15 0825  BP: 147/74 125/57 147/79 159/76  Pulse: 72 73 72 82  Temp:  97.6 F (36.4 C) 98.3 F (36.8 C)   TempSrc:  Oral Oral   Resp: 24 17 16 16   Height:      Weight:   192 lb 0.3 oz (87.1 kg)   SpO2: 95% 92% 93%     Intake/Output Summary (Last 24 hours) at 07/10/15 0844 Last data filed at 07/10/15 0826  Gross per 24 hour  Intake 1011.25 ml  Output   2400 ml  Net -1388.75 ml   Filed Weights   07/09/15 0826 07/10/15 0425  Weight: 183 lb (83.008 kg) 192 lb 0.3 oz (87.1 kg)    PHYSICAL EXAM  General: Pleasant, NAD. Neuro: Alert and oriented X 3. Moves all extremities spontaneously. Psych: Normal affect. HEENT:   Normal  Neck: Supple without bruits or JVD. Lungs:  Resp regular and unlabored, CTA. Heart: RRR no s3, s4, or murmurs. Abdomen: Soft, non-tender, non-distended, BS + x 4.  Extremities: No clubbing, cyanosis or edema. DP/PT/Radials 2+ and equal bilaterally. R cath site without hematoma.  Accessory Clinical Findings  CBC  Recent Labs  07/10/15 0406  WBC 7.5  HGB 14.2  HCT 41.4  MCV 85.0  PLT 203   Basic Metabolic Panel  Recent Labs  07/10/15 0406  NA 137  K 3.8  CL 101  CO2 28  GLUCOSE 119*  BUN 14  CREATININE 1.24  CALCIUM 9.5    TELE  NSR   Procedures    Right/Left Heart Cath and Coronary Angiography 07/09/2015    Conclusion     The left ventricular systolic function is normal. Ejection fraction 55-60%  No mitral regurgitation, no aortic stenosis  Normal right-sided heart pressures, no pulmonary hypertension  Normal oxygen saturations within right-sided structures. No evidence of shunt.  Right coronary artery distal lesion of 70% focal. FFR obtained. Please see secondary report. Significant 0.72  DES placement to distal RCA  Postpone elective shoulder surgery for one year.  Donato SchultzSKAINS, MARK, MD   Overnight stay DAPT one year  Dominance: Right   Left Main  Vessel was injected. Vessel is normal in caliber. Vessel is angiographically normal.     Left Anterior Descending  Vessel was injected.  Vessel is normal in caliber. Vessel is angiographically normal.     Left Circumflex  Vessel was injected. Vessel is normal in caliber. Vessel is angiographically normal.     Right Coronary Artery  Vessel was injected. Moderate to severe - 60-70% distal RCA stenosis.       Right Heart Pressures Normal right heart cath. RA - 5/4/3 RV - 20/2/3 PA - 20/6/12 Wedge - 7/5/4  PA - 70% RV - 64% RA - 62% AO - 97%  CO - 3.46 l/m CI - 1.8   Left Heart    Left Ventricle The left ventricular size is normal. The left ventricular systolic function  is normal. The left ventricular ejection fraction is 55-65% by visual estimate. There are no wall motion abnormalities in the left ventricle.   Mitral Valve There is no mitral valve regurgitation.   Aortic Valve There is no aortic valve stenosis. No calcification found in the aortic valve. There is normal aortic valve motion.   Aorta The size of the ascending aorta is normal.         Radiology/Studies  No results found.  ASSESSMENT AND PLAN Active Problems:   Dyspnea   Coronary artery disease involving native coronary artery of native heart with unstable angina pectoris (HCC)   Unstable angina (HCC)    Plan: L&R cath 07/09/15 normal LV function, no MR or AS, normal R heart pressure, No pulmonary HTN, 70% RCA s/p DES. Ambulating well. DAPT with ASA and plavix for at least one year. Continue ASA. Not on BB or ACE, Md to advice.   - No elective R shoulder surgery until one years, need to be cleared by Dr. Anne Fu. Pt aware.   02/03/2015: Cholesterol 227*; HDL 41; LDL Cholesterol 113*; Triglycerides 366*; VLDL 73*  - Continue Lipitor . Recheck lipid panel as outpatient. May need to add Fibrates.   Lorelei Pont PA-C Pager [phone]

## 2015-07-15 ENCOUNTER — Encounter: Payer: Medicare Other | Admitting: Cardiology

## 2015-08-07 ENCOUNTER — Other Ambulatory Visit: Payer: Self-pay

## 2015-08-07 ENCOUNTER — Encounter: Payer: Self-pay | Admitting: Cardiology

## 2015-08-07 ENCOUNTER — Ambulatory Visit (INDEPENDENT_AMBULATORY_CARE_PROVIDER_SITE_OTHER): Payer: Medicare Other | Admitting: Cardiology

## 2015-08-07 VITALS — BP 138/76 | HR 58 | Ht 66.0 in | Wt 185.0 lb

## 2015-08-07 DIAGNOSIS — Z955 Presence of coronary angioplasty implant and graft: Secondary | ICD-10-CM

## 2015-08-07 DIAGNOSIS — E785 Hyperlipidemia, unspecified: Secondary | ICD-10-CM

## 2015-08-07 DIAGNOSIS — M25519 Pain in unspecified shoulder: Secondary | ICD-10-CM

## 2015-08-07 DIAGNOSIS — I2583 Coronary atherosclerosis due to lipid rich plaque: Principal | ICD-10-CM

## 2015-08-07 DIAGNOSIS — I10 Essential (primary) hypertension: Secondary | ICD-10-CM | POA: Diagnosis not present

## 2015-08-07 DIAGNOSIS — I208 Other forms of angina pectoris: Secondary | ICD-10-CM | POA: Diagnosis not present

## 2015-08-07 DIAGNOSIS — I2510 Atherosclerotic heart disease of native coronary artery without angina pectoris: Secondary | ICD-10-CM

## 2015-08-07 NOTE — Patient Instructions (Signed)
Medication Instructions:  The current medical regimen is effective;  continue present plan and medications.  Follow-Up: Follow up in 6 months with Dr. Anne FuSkains.  You will receive a letter in the mail 2 months before you are due.  Please call us when you receive this letter to schedule your follow up appointment.  You have been referred to Cardiac Rehab.  They will contact you to discuss this referral.  If you need a refill on your cardiac medications before your next appointment, please call your pharmacy.  Thank you for choosing [facility]!!
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
## 2016-02-09 ENCOUNTER — Ambulatory Visit (INDEPENDENT_AMBULATORY_CARE_PROVIDER_SITE_OTHER): Payer: Medicare Other | Admitting: Cardiology

## 2016-02-09 ENCOUNTER — Encounter: Payer: Self-pay | Admitting: Cardiology

## 2016-02-09 VITALS — BP 140/74 | HR 61 | Ht 67.0 in | Wt 174.1 lb

## 2016-02-09 DIAGNOSIS — I10 Essential (primary) hypertension: Secondary | ICD-10-CM | POA: Diagnosis not present

## 2016-02-09 DIAGNOSIS — E785 Hyperlipidemia, unspecified: Secondary | ICD-10-CM

## 2016-02-09 DIAGNOSIS — I2583 Coronary atherosclerosis due to lipid rich plaque: Secondary | ICD-10-CM

## 2016-02-09 DIAGNOSIS — Z79899 Other long term (current) drug therapy: Secondary | ICD-10-CM

## 2016-02-09 DIAGNOSIS — I2510 Atherosclerotic heart disease of native coronary artery without angina pectoris: Secondary | ICD-10-CM | POA: Diagnosis not present

## 2016-02-09 LAB — LIPID PANEL
CHOL/HDL RATIO: 4.1 ratio (ref <=5.0)
CHOLESTEROL: 164 mg/dL (ref 125–200)
HDL: 40 mg/dL (ref >=40)
LDL CALC: 91 mg/dL (ref <130)
TRIGLYCERIDES: 167 mg/dL — AB (ref <150)
VLDL: 33 mg/dL — AB (ref <30)

## 2016-02-09 LAB — COMPREHENSIVE METABOLIC PANEL
ALK PHOS: 65 U/L (ref 40–115)
ALT: 29 U/L (ref 9–46)
AST: 23 U/L (ref 10–35)
Albumin: 4.5 g/dL (ref 3.6–5.1)
BILIRUBIN TOTAL: 0.6 mg/dL (ref 0.2–1.2)
BUN: 15 mg/dL (ref 7–25)
CALCIUM: 9.5 mg/dL (ref 8.6–10.3)
CO2: 29 mmol/L (ref 20–31)
CREATININE: 1.08 mg/dL (ref 0.70–1.25)
Chloride: 101 mmol/L (ref 98–110)
GLUCOSE: 101 mg/dL — AB (ref 65–99)
Potassium: 4.8 mmol/L (ref 3.5–5.3)
SODIUM: 137 mmol/L (ref 135–146)
Total Protein: 7.2 g/dL (ref 6.1–8.1)

## 2016-02-09 MED ORDER — LISINOPRIL 10 MG PO TABS: 10.0000 mg | ORAL_TABLET | Freq: Every day | ORAL | Status: DC

## 2016-02-09 MED ORDER — NITROGLYCERIN 0.4 MG SL SUBL: 0.4000 mg | SUBLINGUAL_TABLET | SUBLINGUAL | Status: DC | PRN

## 2016-02-09 NOTE — Patient Instructions (Signed)
Medication Instructions:  Please start Lisinopril 10 mg once a day.  Monitor your blood pressure at home and keep a diary.  If you notice your blood pressure continually above 140/90, please contact us. Continue all other medications as listed.  Labwork: Please have blood work today (CMP,Lipid)  Follow-Up: Follow up in 6 months with Dr. Anne FuSkains.  You will receive a letter in the mail 2 months before you are due.  Please call us when you receive this letter to schedule your follow up appointment.  If you need a refill on your cardiac medications before your next appointment, please call your pharmacy.  Thank you for choosing [facility]!!
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
## 2016-02-12 ENCOUNTER — Other Ambulatory Visit: Payer: Self-pay | Admitting: *Deleted

## 2016-02-12 DIAGNOSIS — Z79899 Other long term (current) drug therapy: Secondary | ICD-10-CM

## 2016-02-12 DIAGNOSIS — E785 Hyperlipidemia, unspecified: Secondary | ICD-10-CM

## 2016-02-12 MED ORDER — ATORVASTATIN CALCIUM 80 MG PO TABS: 80.0000 mg | ORAL_TABLET | Freq: Every day | ORAL | Status: DC

## 2016-02-16 ENCOUNTER — Other Ambulatory Visit: Payer: Self-pay | Admitting: *Deleted

## 2016-02-16 MED ORDER — ATORVASTATIN CALCIUM 80 MG PO TABS: 80.0000 mg | ORAL_TABLET | Freq: Every day | ORAL | Status: DC

## 2016-02-16 NOTE — Telephone Encounter (Signed)
Patients wife requested a refill on patients new dose of atorvastatin. She stated that it was supposed to have already been sent in, but the pharmacy does not have it. I will send in rx.

## 2016-04-18 ENCOUNTER — Other Ambulatory Visit: Payer: Medicare Other | Admitting: Internal Medicine

## 2016-04-18 ENCOUNTER — Other Ambulatory Visit: Payer: Self-pay | Admitting: Internal Medicine

## 2016-04-18 DIAGNOSIS — N400 Enlarged prostate without lower urinary tract symptoms: Secondary | ICD-10-CM | POA: Diagnosis not present

## 2016-04-18 DIAGNOSIS — I519 Heart disease, unspecified: Secondary | ICD-10-CM | POA: Diagnosis not present

## 2016-04-18 DIAGNOSIS — E785 Hyperlipidemia, unspecified: Secondary | ICD-10-CM | POA: Diagnosis not present

## 2016-04-18 DIAGNOSIS — Z0000 Encounter for general adult medical examination without abnormal findings: Secondary | ICD-10-CM

## 2016-04-18 DIAGNOSIS — Z131 Encounter for screening for diabetes mellitus: Secondary | ICD-10-CM | POA: Diagnosis not present

## 2016-04-18 LAB — CBC WITH DIFFERENTIAL/PLATELET
BASOS PCT: 0 %
Basophils Absolute: 0 cells/uL (ref 0–200)
EOS PCT: 4 %
Eosinophils Absolute: 272 cells/uL (ref 15–500)
HCT: 42.3 % (ref 38.5–50.0)
HEMOGLOBIN: 14.1 g/dL (ref 13.2–17.1)
LYMPHS ABS: 1156 {cells}/uL (ref 850–3900)
Lymphocytes Relative: 17 %
MCH: 28.7 pg (ref 27.0–33.0)
MCHC: 33.3 g/dL (ref 32.0–36.0)
MCV: 86 fL (ref 80.0–100.0)
MPV: 10.2 fL (ref 7.5–12.5)
Monocytes Absolute: 680 cells/uL (ref 200–950)
Monocytes Relative: 10 %
NEUTROS ABS: 4692 {cells}/uL (ref 1500–7800)
Neutrophils Relative %: 69 %
Platelets: 209 10*3/uL (ref 140–400)
RBC: 4.92 MIL/uL (ref 4.20–5.80)
RDW: 13.8 % (ref 11.0–15.0)
WBC: 6.8 10*3/uL (ref 3.8–10.8)

## 2016-04-18 LAB — COMPREHENSIVE METABOLIC PANEL
ALK PHOS: 71 U/L (ref 40–115)
ALT: 25 U/L (ref 9–46)
AST: 20 U/L (ref 10–35)
Albumin: 4.7 g/dL (ref 3.6–5.1)
BUN: 24 mg/dL (ref 7–25)
CALCIUM: 9.5 mg/dL (ref 8.6–10.3)
CO2: 26 mmol/L (ref 20–31)
Chloride: 103 mmol/L (ref 98–110)
Creat: 1.09 mg/dL (ref 0.70–1.25)
GLUCOSE: 111 mg/dL — AB (ref 65–99)
POTASSIUM: 4.8 mmol/L (ref 3.5–5.3)
Sodium: 140 mmol/L (ref 135–146)
TOTAL PROTEIN: 7.1 g/dL (ref 6.1–8.1)
Total Bilirubin: 0.4 mg/dL (ref 0.2–1.2)

## 2016-04-18 LAB — LIPID PANEL
CHOLESTEROL: 152 mg/dL (ref 125–200)
HDL: 39 mg/dL — ABNORMAL LOW (ref >=40)
LDL CALC: 74 mg/dL (ref <130)
TRIGLYCERIDES: 197 mg/dL — AB (ref <150)
Total CHOL/HDL Ratio: 3.9 Ratio (ref <=5.0)
VLDL: 39 mg/dL — AB (ref <30)

## 2016-04-18 LAB — PSA: PSA: 0.7 ng/mL (ref <=4.0)

## 2016-04-21 ENCOUNTER — Encounter: Payer: Self-pay | Admitting: Internal Medicine

## 2016-04-21 ENCOUNTER — Ambulatory Visit (INDEPENDENT_AMBULATORY_CARE_PROVIDER_SITE_OTHER): Payer: Medicare Other | Admitting: Internal Medicine

## 2016-04-21 ENCOUNTER — Telehealth: Payer: Self-pay | Admitting: *Deleted

## 2016-04-21 VITALS — BP 130/86 | HR 79 | Temp 97.8°F | Wt 176.0 lb

## 2016-04-21 DIAGNOSIS — Z23 Encounter for immunization: Secondary | ICD-10-CM | POA: Diagnosis not present

## 2016-04-21 DIAGNOSIS — I10 Essential (primary) hypertension: Secondary | ICD-10-CM | POA: Diagnosis not present

## 2016-04-21 DIAGNOSIS — Z8719 Personal history of other diseases of the digestive system: Secondary | ICD-10-CM

## 2016-04-21 DIAGNOSIS — Z9861 Coronary angioplasty status: Secondary | ICD-10-CM

## 2016-04-21 DIAGNOSIS — E781 Pure hyperglyceridemia: Secondary | ICD-10-CM | POA: Diagnosis not present

## 2016-04-21 DIAGNOSIS — H9319 Tinnitus, unspecified ear: Secondary | ICD-10-CM | POA: Diagnosis not present

## 2016-04-21 DIAGNOSIS — Z955 Presence of coronary angioplasty implant and graft: Secondary | ICD-10-CM

## 2016-04-21 DIAGNOSIS — M12819 Other specific arthropathies, not elsewhere classified, unspecified shoulder: Secondary | ICD-10-CM

## 2016-04-21 DIAGNOSIS — M19011 Primary osteoarthritis, right shoulder: Secondary | ICD-10-CM

## 2016-04-21 DIAGNOSIS — Z0000 Encounter for general adult medical examination without abnormal findings: Secondary | ICD-10-CM

## 2016-04-21 LAB — GLUCOSE, RANDOM: Glucose, Bld: 110 mg/dL — ABNORMAL HIGH (ref 65–99)

## 2016-04-21 NOTE — Telephone Encounter (Signed)
Called [facility] and had the following tests added to the blood work already sent per verbal orders of Dr. Lenord FellersBaxley: A1C, Elevated Serum Glucose, HIV, Hep C
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
## 2016-04-22 LAB — POCT URINALYSIS DIPSTICK
Bilirubin, UA: NEGATIVE
Blood, UA: NEGATIVE
GLUCOSE UA: NEGATIVE
Ketones, UA: NEGATIVE
LEUKOCYTES UA: NEGATIVE
NITRITE UA: NEGATIVE
PROTEIN UA: NEGATIVE
Spec Grav, UA: 1.02
UROBILINOGEN UA: NEGATIVE
pH, UA: 5

## 2016-04-22 LAB — HIV ANTIBODY (ROUTINE TESTING W REFLEX): HIV: NONREACTIVE

## 2016-04-22 LAB — HEMOGLOBIN A1C
Hgb A1c MFr Bld: 5.6 % (ref <5.7)
Mean Plasma Glucose: 114 mg/dL

## 2016-04-22 LAB — HEPATITIS C ANTIBODY: HCV Ab: NEGATIVE

## 2016-04-23 NOTE — Patient Instructions (Addendum)
It was a pleasure to see you today. Dietary referral will be made. Consider hearing evaluation. Continue same medications. Continue diet exercise regimen. Return in 6 months.

## 2016-05-10 ENCOUNTER — Encounter: Payer: Medicare Other | Attending: Internal Medicine | Admitting: Dietician

## 2016-05-10 ENCOUNTER — Encounter: Payer: Self-pay | Admitting: Dietician

## 2016-05-10 DIAGNOSIS — Z029 Encounter for administrative examinations, unspecified: Secondary | ICD-10-CM | POA: Insufficient documentation

## 2016-05-10 DIAGNOSIS — R7309 Other abnormal glucose: Secondary | ICD-10-CM

## 2016-05-10 DIAGNOSIS — E785 Hyperlipidemia, unspecified: Secondary | ICD-10-CM

## 2016-05-10 NOTE — Patient Instructions (Addendum)
Increase your water intake to 8-10 cups per day. Resume the exercise habit.   Increase your intake of vegetables. Reduce the fruit. Eat more vegetables.  Small portions of lean meat.

## 2016-05-10 NOTE — Progress Notes (Signed)
  Medical Nutrition Therapy:  Appt start time: 1400 end time:  1500.   Assessment:  Primary concerns today: Patient is here today with his wife.  He is frustrated because he had been trying to lose weight and felt that he was not making progress.  He is referred for elevated glucose levels.  His A1C was 5.6% 04/18/16.  Other hx includes cardiac stents, pancreatitis, and HTN.  Cholesterol 152, triglycerides 197, HDL 39, LDL 74 04/18/16.  His triglycerides have decreased from 690 in July 2015.    Weight hx: Lowest weight 155 lbs in his 30's Highest adult weight 190 lbs 2 years ago Today's weight 180 lbs increased from recent weight of 171-175 lbs.  He lives with his wife.  She grows a garden.  They both shop and cook.  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  He still travels a lot for his job.    Preferred Learning Style:   No preference indicated   Learning Readiness:   Ready  Change in progress   MEDICATIONS: see list   DIETARY INTAKE:  Usual eating pattern includes 3 meals and 3 snacks per day.  Everyday foods include lean meats, small portions, stops when satisfied.  Avoided foods include bacon, rare eggs.    24-hr recall:  B ( AM): dannon lite and fit yogurt with berries and grapes and coffee with sweetened creamer  Snk ( AM): fruit  L ( PM): Malawiturkey or chicken sandwich with lettuce and tomato and mayo on white Snk ( PM): fruit D ( PM): lean meat or stew or steak or fish with pasta or potatoes or jasmine rice, corn, green beans, salad or other veges Snk ( PM): fruit or peanut butter Beverages: water, coffee with creamer, stopped drinking sweet tea  Usual physical activity: yard work.  Was walking 2-3 miles daily until about 1 month ago and quit after becoming discouraged about his weight.  Estimated energy needs: 1500 calories 170 g carbohydrates 112 g protein 42 g fat  Progress Towards Goal(s):  In progress.   Nutritional  Diagnosis:  NB-1.1 Food and nutrition-related knowledge deficit As related to balance of carbohydrate, protein, and fat.  As evidenced by diet hx and patient report.    Intervention:  Nutrition counseling/ education related to prediabetes and cardiovascular health.  Discussed importance of increased physical activity such as walking that is approved by his MD.  Discussed alternative snacks to increased fruit and other meal ideas.  Discussed increasing nuts or other healthy fats rather than increased fruit.  Increase your water intake to 8-10 cups per day. Resume the exercise habit.   Increase your intake of vegetables. Reduce the fruit. Eat more vegetables.  Small portions of lean meat.  Teaching Method Utilized:  Visual Auditory Hands on  Handouts given during visit include:  Mediterranean diet  Snack list  Meal plan card  Barriers to learning/adherence to lifestyle change: none  Demonstrated degree of understanding via:  Teach Back   Monitoring/Evaluation:  Dietary intake, exercise, and body weight prn.

## 2016-05-12 ENCOUNTER — Encounter (HOSPITAL_COMMUNITY): Payer: Self-pay | Admitting: Emergency Medicine

## 2016-05-12 ENCOUNTER — Inpatient Hospital Stay (HOSPITAL_COMMUNITY)
Admission: EM | Admit: 2016-05-12 | Discharge: 2016-05-15 | DRG: 439 | Disposition: A | Discharge status: Home / Self Care | Payer: Medicare Other | Attending: Internal Medicine | Admitting: Internal Medicine

## 2016-05-12 DIAGNOSIS — E785 Hyperlipidemia, unspecified: Secondary | ICD-10-CM | POA: Diagnosis present

## 2016-05-12 DIAGNOSIS — Z955 Presence of coronary angioplasty implant and graft: Secondary | ICD-10-CM

## 2016-05-12 DIAGNOSIS — I10 Essential (primary) hypertension: Secondary | ICD-10-CM | POA: Diagnosis present

## 2016-05-12 DIAGNOSIS — Z79899 Other long term (current) drug therapy: Secondary | ICD-10-CM | POA: Diagnosis not present

## 2016-05-12 DIAGNOSIS — Z7982 Long term (current) use of aspirin: Secondary | ICD-10-CM | POA: Diagnosis not present

## 2016-05-12 DIAGNOSIS — Z7902 Long term (current) use of antithrombotics/antiplatelets: Secondary | ICD-10-CM

## 2016-05-12 DIAGNOSIS — E871 Hypo-osmolality and hyponatremia: Secondary | ICD-10-CM | POA: Diagnosis not present

## 2016-05-12 DIAGNOSIS — I2510 Atherosclerotic heart disease of native coronary artery without angina pectoris: Secondary | ICD-10-CM | POA: Diagnosis present

## 2016-05-12 DIAGNOSIS — K8500 Idiopathic acute pancreatitis without necrosis or infection: Principal | ICD-10-CM | POA: Diagnosis present

## 2016-05-12 DIAGNOSIS — K859 Acute pancreatitis without necrosis or infection, unspecified: Secondary | ICD-10-CM | POA: Diagnosis present

## 2016-05-12 DIAGNOSIS — M1990 Unspecified osteoarthritis, unspecified site: Secondary | ICD-10-CM | POA: Diagnosis not present

## 2016-05-12 HISTORY — DX: Prediabetes: R73.03

## 2016-05-12 HISTORY — DX: Tinnitus, unspecified ear: H93.19

## 2016-05-12 LAB — LIPASE, BLOOD: Lipase: 312 U/L — ABNORMAL HIGH (ref 11–51)

## 2016-05-12 LAB — COMPREHENSIVE METABOLIC PANEL
ALBUMIN: 4.4 g/dL (ref 3.5–5.0)
ALK PHOS: 62 U/L (ref 38–126)
ALT: 20 U/L (ref 17–63)
AST: 23 U/L (ref 15–41)
Anion gap: 9 (ref 5–15)
BUN: 15 mg/dL (ref 6–20)
CHLORIDE: 97 mmol/L — AB (ref 101–111)
CO2: 26 mmol/L (ref 22–32)
CREATININE: 1.25 mg/dL — AB (ref 0.61–1.24)
Calcium: 9.5 mg/dL (ref 8.9–10.3)
GFR calc non Af Amer: >60 mL/min (ref >60)
GLUCOSE: 141 mg/dL — AB (ref 65–99)
Potassium: 4 mmol/L (ref 3.5–5.1)
SODIUM: 132 mmol/L — AB (ref 135–145)
Total Bilirubin: 1 mg/dL (ref 0.3–1.2)
Total Protein: 7.8 g/dL (ref 6.5–8.1)

## 2016-05-12 LAB — CBC
HCT: 41.6 % (ref 39.0–52.0)
Hemoglobin: 14.4 g/dL (ref 13.0–17.0)
MCH: 29 pg (ref 26.0–34.0)
MCHC: 34.6 g/dL (ref 30.0–36.0)
MCV: 83.7 fL (ref 78.0–100.0)
PLATELETS: 263 10*3/uL (ref 150–400)
RBC: 4.97 MIL/uL (ref 4.22–5.81)
RDW: 12.4 % (ref 11.5–15.5)
WBC: 12.5 10*3/uL — ABNORMAL HIGH (ref 4.0–10.5)

## 2016-05-12 MED ORDER — OXYCODONE-ACETAMINOPHEN 5-325 MG PO TABS
1.0000 | ORAL_TABLET | ORAL | Status: DC | PRN
  Administered 2016-05-12: 1 via ORAL

## 2016-05-12 MED ORDER — FENTANYL CITRATE (PF) 100 MCG/2ML IJ SOLN
50.0000 ug | INTRAMUSCULAR | Status: DC | PRN
  Administered 2016-05-12: 50 ug via NASAL

## 2016-05-12 MED ORDER — OXYCODONE-ACETAMINOPHEN 5-325 MG PO TABS
ORAL_TABLET | ORAL | Status: AC
  Filled 2016-05-12: qty 1

## 2016-05-12 MED ORDER — ONDANSETRON 4 MG PO TBDP
4.0000 mg | ORAL_TABLET | Freq: Once | ORAL | Status: AC | PRN
  Administered 2016-05-12: 4 mg via ORAL

## 2016-05-12 MED ORDER — FAMOTIDINE IN NACL 20-0.9 MG/50ML-% IV SOLN
20.0000 mg | Freq: Two times a day (BID) | INTRAVENOUS | Status: DC
  Administered 2016-05-12 – 2016-05-14 (×4): 20 mg via INTRAVENOUS
  Filled 2016-05-12 (×5): qty 50

## 2016-05-12 MED ORDER — SODIUM CHLORIDE 0.9 % IV SOLN
INTRAVENOUS | Status: DC
  Administered 2016-05-13 – 2016-05-15 (×6): via INTRAVENOUS

## 2016-05-12 MED ORDER — FENTANYL CITRATE (PF) 100 MCG/2ML IJ SOLN
INTRAMUSCULAR | Status: AC
  Filled 2016-05-12: qty 2

## 2016-05-12 MED ORDER — ONDANSETRON 4 MG PO TBDP
ORAL_TABLET | ORAL | Status: AC
  Filled 2016-05-12: qty 1

## 2016-05-12 MED ORDER — HYDROMORPHONE HCL 1 MG/ML IJ SOLN
1.0000 mg | Freq: Once | INTRAMUSCULAR | Status: AC
  Administered 2016-05-12: 1 mg via INTRAVENOUS
  Filled 2016-05-12: qty 1

## 2016-05-12 MED ORDER — SODIUM CHLORIDE 0.9 % IV BOLUS (SEPSIS)
1000.0000 mL | Freq: Once | INTRAVENOUS | Status: AC
  Administered 2016-05-12: 1000 mL via INTRAVENOUS

## 2016-05-12 MED ORDER — KETOROLAC TROMETHAMINE 30 MG/ML IJ SOLN
30.0000 mg | Freq: Once | INTRAMUSCULAR | Status: AC
  Administered 2016-05-12: 30 mg via INTRAVENOUS
  Filled 2016-05-12: qty 1

## 2016-05-12 MED ORDER — HYDROMORPHONE HCL 1 MG/ML IJ SOLN
1.0000 mg | INTRAMUSCULAR | Status: DC | PRN
  Administered 2016-05-13 – 2016-05-15 (×11): 1 mg via INTRAVENOUS
  Filled 2016-05-12 (×11): qty 1

## 2016-05-12 MED ORDER — SODIUM CHLORIDE 0.9 % IV SOLN
Freq: Once | INTRAVENOUS | Status: AC
  Administered 2016-05-12: 21:00:00 via INTRAVENOUS

## 2016-05-12 MED ORDER — ONDANSETRON 4 MG PO TBDP
4.0000 mg | ORAL_TABLET | Freq: Three times a day (TID) | ORAL | Status: AC | PRN
Start: 2016-05-12 — End: 2016-05-14
  Administered 2016-05-14: 4 mg via ORAL
  Filled 2016-05-12: qty 1

## 2016-05-12 MED ORDER — ENOXAPARIN SODIUM 40 MG/0.4ML ~~LOC~~ SOLN
40.0000 mg | Freq: Every day | SUBCUTANEOUS | Status: DC
  Administered 2016-05-13 – 2016-05-15 (×3): 40 mg via SUBCUTANEOUS
  Filled 2016-05-12 (×3): qty 0.4

## 2016-05-12 NOTE — ED Provider Notes (Signed)
[facility]-EM[facility]Y DEPT Provider Note   CSN: [id number] Arrival date & time: 05/12/16  1355     History   Chief Complaint Chief Complaint  Patient presents with  . Abdominal Pain    HPI David Cantu is a 60 y.o. male.  HPI  Patient presents with concern of abdominal pain. Patient states the pain began about 3 days ago, suddenly, and since onset has been progressive, with worsening severe sore pain in the upper abdomen. There is associated nausea, anorexia, vomiting. No fever. There are chills. No dyspnea. Patient states the symptoms are"exactly"the same as those he experienced 3 years ago, when he was diagnosed with pancreatitis. That admission did not result in any etiology for his pancreatitis. Patient reports he does not drink, does not use drugs, does not smoke. Patient does have history of coronary disease, Plavix, has history of one stent.   Past Medical History:  Diagnosis Date  . Acute pancreatitis 02/03/2014  . Arthritis    "shoulders" (02/03/2014)  . CAD (coronary artery disease)    a. L&R cath 07/09/15 normal LV function, no MR or AS, normal R heart pressure, No pulmonary HTN, 70% RCA s/p DES  . WUJWJXBJ(478.2)    "weekly" (02/03/2014)  . Heart murmur   . Hypertension   . Migraine    "weekly" (02/03/2014)  . Pneumonia 1960; ~ 1992  . Shingles     Patient Active Problem List   Diagnosis Date Noted  . Essential hypertension, benign 08/07/2015  . Stented coronary artery   . Hypertensive heart disease without heart failure   . Unstable angina (HCC) 07/09/2015  . Coronary artery disease involving native coronary artery of native heart with unstable angina pectoris (HCC)   . Bilateral shoulder pain 02/03/2015  . Pruritic rash 11/20/2014  . Preventative health care 06/03/2014  . Hyperlipidemia 02/25/2014  . Post herpetic neuralgia 02/25/2014  . Dyspnea 02/13/2014  . Hx of acute pancreatitis 02/03/2014  . Essential hypertension 02/03/2014    Past Surgical  History:  Procedure Laterality Date  . CARDIAC CATHETERIZATION N/A 07/09/2015   Procedure: Right/Left Heart Cath and Coronary Angiography;  Surgeon: Jake Bathe, MD;  Location: [facility] INVASIVE CV LAB;  Service: Cardiovascular;  Laterality: N/A;  . CARDIAC CATHETERIZATION N/A 07/09/2015   Procedure: Intravascular Pressure Wire/FFR Study;  Surgeon: Kathleene Hazel, MD;  Location: [facility] INVASIVE CV LAB;  Service: Cardiovascular;  Laterality: N/A;  . CARDIAC CATHETERIZATION N/A 07/09/2015   Procedure: Coronary Stent Intervention;  Surgeon: Kathleene Hazel, MD;  Location: [facility] INVASIVE CV LAB;  Service: Cardiovascular;  Laterality: N/A;  Distal RCA  . SHOULDER ARTHROSCOPY W/ ROTATOR CUFF REPAIR Left 2011  . TRACHEOSTOMY  1960  . TRACHEOSTOMY CLOSURE  1960       Home Medications    Prior to Admission medications   Medication Sig Start Date End Date Taking? Authorizing Provider  aspirin 81 MG chewable tablet Chew 1 tablet (81 mg total) by mouth daily. 07/10/15   Bhavinkumar Bhagat, PA  atorvastatin (LIPITOR) 80 MG tablet Take 1 tablet (80 mg total) by mouth daily at 6 PM. 02/16/16   Jake Bathe, MD  carvedilol (COREG) 6.25 MG tablet Take 1 tablet (6.25 mg total) by mouth 2 (two) times daily with a meal. 07/10/15   Manson Passey, PA  clopidogrel (PLAVIX) 75 MG tablet Take 1 tablet (75 mg total) by mouth daily with breakfast. 07/10/15   Bhavinkumar Bhagat, PA  diphenhydramine-acetaminophen (TYLENOL PM) 25-500 MG TABS tablet Take 2 tablets by mouth  at bedtime as needed.    Historical Provider, MD  lisinopril (PRINIVIL,ZESTRIL) 10 MG tablet Take 1 tablet (10 mg total) by mouth daily. 02/09/16   Jake BatheMark C Skains, MD  nitroGLYCERIN (NITROSTAT) 0.4 MG SL tablet Place 1 tablet (0.4 mg total) under the tongue every 5 (five) minutes as needed for chest pain. 02/09/16   Jake BatheMark C Skains, MD  pantoprazole (PROTONIX) 40 MG tablet Take 1 tablet (40 mg total) by mouth daily. 07/10/15   Manson PasseyBhavinkumar Bhagat, PA     Family History Family History  Problem Relation Age of Onset  . Colon cancer Neg Hx     Social History Social History  Substance Use Topics  . Smoking status: Never Smoker  . Smokeless tobacco: Never Used  . Alcohol use 0.0 oz/week     Comment: Socially.     Allergies   Review of patient's allergies indicates no known allergies.   Review of Systems Review of Systems  Constitutional:       Per HPI, otherwise negative  HENT:       Per HPI, otherwise negative  Respiratory:       Per HPI, otherwise negative  Cardiovascular:       Per HPI, otherwise negative  Gastrointestinal: Positive for abdominal pain, nausea and vomiting.  Endocrine:       Negative aside from HPI  Genitourinary:       Neg aside from HPI   Musculoskeletal:       Per HPI, otherwise negative  Skin: Negative.   Neurological: Negative for syncope.     Physical Exam Updated Vital Signs BP 158/81 (BP Location: Right Arm)   Pulse 71   Temp 99 F (37.2 C) (Oral)   Resp 19   Ht 5\' 6"  (1.676 m)   Wt 180 lb (81.6 kg)   SpO2 97%   BMI 29.05 kg/m   Physical Exam  Constitutional: He is oriented to person, place, and time. He appears well-developed. No distress.  HENT:  Head: Normocephalic and atraumatic.  Eyes: Conjunctivae and EOM are normal.  Cardiovascular: Normal rate and regular rhythm.   Pulmonary/Chest: Effort normal. No stridor. No respiratory distress.  Abdominal: He exhibits no distension.  Musculoskeletal: He exhibits no edema.  Neurological: He is alert and oriented to person, place, and time.  Skin: Skin is warm and dry.  Psychiatric: He has a normal mood and affect.  Nursing note and vitals reviewed.    ED Treatments / Results  Labs (all labs ordered are listed, but only abnormal results are displayed) Labs Reviewed  LIPASE, BLOOD - Abnormal; Notable for the following:       Result Value   Lipase 312 (*)    All other components within normal limits  COMPREHENSIVE  METABOLIC PANEL - Abnormal; Notable for the following:    Sodium 132 (*)    Chloride 97 (*)    Glucose, Bld 141 (*)    Creatinine, Ser 1.25 (*)    All other components within normal limits  CBC - Abnormal; Notable for the following:    WBC 12.5 (*)    All other components within normal limits  URINALYSIS, ROUTINE W REFLEX MICROSCOPIC (NOT AT [facility])     Procedures Procedures (including critical care time)  Medications Ordered in ED Medications  oxyCODONE-acetaminophen (PERCOCET/ROXICET) 5-325 MG per tablet 1 tablet (1 tablet Oral Given 05/12/16 1416)  fentaNYL (SUBLIMAZE) injection 50 mcg (50 mcg Nasal Given 05/12/16 1459)  fentaNYL (SUBLIMAZE) 100 MCG/2ML injection (not administered)  ondansetron (ZOFRAN-ODT) disintegrating tablet 4 mg (4 mg Oral Given 05/12/16 1415)  sodium chloride 0.9 % bolus 1,000 mL (1,000 mLs Intravenous New Bag/Given 05/12/16 1808)  sodium chloride 0.9 % bolus 1,000 mL (1,000 mLs Intravenous New Bag/Given 05/12/16 1829)  HYDROmorphone (DILAUDID) injection 1 mg (1 mg Intravenous Given 05/12/16 1845)     Initial Impression / Assessment and Plan / ED Course  I have reviewed the triage vital signs and the nursing notes.  Pertinent labs & imaging results that were available during my care of the patient were reviewed by me and considered in my medical decision making (see chart for details).  Clinical Course   Patient history pancreatitis, coronary disease presents with new abdominal pain for several days, nausea, vomiting. Here the patient is awake and alert, but continues to complain of pain in the upper abdomen. Patient was found to have lipase greater than 300, and given the consistency of signs and symptoms of pancreatitis, the patient was started on fluid resuscitation, received analgesia, antiemetics, was admitted for further evaluation and management.    Gerhard Munch, MD 05/12/16 1946

## 2016-05-12 NOTE — ED Triage Notes (Signed)
Pt states he had a sudden onset of constant sharp pain in his LUQ that started yesterday at 5pm. History of pancreatitis.

## 2016-05-12 NOTE — H&P (Signed)
History and Physical    David Cantu ZOX:[id number] DOB: 15-Dec-1955 DOA: 05/12/2016  PCP: [name], MD   Patient coming from: Home.  Chief Complaint: Abdominal Pain.  HPI: David Cantu is a 61 y.o. male with medical history significant of osteoarthritis, CAD, hypertension hyperlipidemia who is coming to the ER due to abdominal pain since yesterday around 1700.  Per patient, he started feeling some abdominal discomfort since Sunday. However, yesterday evening the pain got a lot worse in intensity to the point that he has been unable to eat anything since then. He denies emesis, diarrhea, GU symptoms, but complains of nausea. He denies CP, dyspnea, palpitations, orthopnea or PND.  ED Course: The patient received a 2 L NS bolus, hydromorphone 1 mg and ondansetron 4 mg IVP reporting relief. Work up shows WBC of 12.5, sodium of 132 mmol/L, glucose of 141 and BUN of 1.25 mg/dL and a lipase level of 811 U.   Review of Systems: As per HPI otherwise 10 point review of systems negative.    Past Medical History:  Diagnosis Date  . Acute pancreatitis 02/03/2014  . Arthritis    "shoulders" (02/03/2014)  . CAD (coronary artery disease)    a. L&R cath 07/09/15 normal LV function, no MR or AS, normal R heart pressure, No pulmonary HTN, 70% RCA s/p DES  . BJYNWGNF(621.3)    "weekly" (02/03/2014)  . Heart murmur   . Hypertension   . Migraine    "weekly" (02/03/2014)  . Pneumonia 1960; ~ 1992  . Shingles     Past Surgical History:  Procedure Laterality Date  . CARDIAC CATHETERIZATION N/A 07/09/2015   Procedure: Right/Left Heart Cath and Coronary Angiography;  Surgeon: Jake Bathe, MD;  Location: [facility] INVASIVE CV LAB;  Service: Cardiovascular;  Laterality: N/A;  . CARDIAC CATHETERIZATION N/A 07/09/2015   Procedure: Intravascular Pressure Wire/FFR Study;  Surgeon: Kathleene Hazel, MD;  Location: [facility] INVASIVE CV LAB;  Service: Cardiovascular;  Laterality: N/A;  . CARDIAC CATHETERIZATION  N/A 07/09/2015   Procedure: Coronary Stent Intervention;  Surgeon: Kathleene Hazel, MD;  Location: [facility] INVASIVE CV LAB;  Service: Cardiovascular;  Laterality: N/A;  Distal RCA  . SHOULDER ARTHROSCOPY W/ ROTATOR CUFF REPAIR Left 2011  . TRACHEOSTOMY  1960  . TRACHEOSTOMY CLOSURE  1960     reports that he has never smoked. He has never used smokeless tobacco. He reports that he drinks alcohol. He reports that he does not use drugs.  No Known Allergies  Family History  Problem Relation Age of Onset  . Colon cancer Neg Hx     Prior to Admission medications   Medication Sig Start Date End Date Taking? Authorizing Provider  aspirin 81 MG chewable tablet Chew 1 tablet (81 mg total) by mouth daily. 07/10/15   Bhavinkumar Bhagat, PA  atorvastatin (LIPITOR) 80 MG tablet Take 1 tablet (80 mg total) by mouth daily at 6 PM. 02/16/16   Jake Bathe, MD  carvedilol (COREG) 6.25 MG tablet Take 1 tablet (6.25 mg total) by mouth 2 (two) times daily with a meal. 07/10/15   Manson Passey, PA  clopidogrel (PLAVIX) 75 MG tablet Take 1 tablet (75 mg total) by mouth daily with breakfast. 07/10/15   Bhavinkumar Bhagat, PA  diphenhydramine-acetaminophen (TYLENOL PM) 25-500 MG TABS tablet Take 2 tablets by mouth at bedtime as needed.    Historical Provider, MD  lisinopril (PRINIVIL,ZESTRIL) 10 MG tablet Take 1 tablet (10 mg total) by mouth daily. 02/09/16   Jake Bathe,  MD  nitroGLYCERIN (NITROSTAT) 0.4 MG SL tablet Place 1 tablet (0.4 mg total) under the tongue every 5 (five) minutes as needed for chest pain. 02/09/16   Jake Bathe, MD  pantoprazole (PROTONIX) 40 MG tablet Take 1 tablet (40 mg total) by mouth daily. 07/10/15   Manson Passey, PA    Physical Exam:  Constitutional: NAD, calm, comfortable Vitals:   05/12/16 1408 05/12/16 1445 05/12/16 1647 05/12/16 1835  BP: 151/86 163/76 148/78 158/81  Pulse: 89 77 69 71  Resp: 17 16 18 19   Temp: 97.8 F (36.6 C) 99 F (37.2 C)    TempSrc:  Oral Oral    SpO2: 97% 98% 96% 97%  Weight: 81.6 kg (180 lb)     Height: 5\' 6"  (1.676 m)      Eyes: PERRL, lids and conjunctivae normal ENMT: Mucous membranes are moist. Posterior pharynx clear of any exudate or lesions. Dentition in overall good state with previous signs of work on lower molars. Neck: normal, supple, no masses, no thyromegaly Respiratory: clear to auscultation bilaterally, no wheezing, no crackles. Normal respiratory effort. No accessory muscle use.  Cardiovascular: Regular rate and rhythm, no murmurs / rubs / gallops. No extremity edema. 2+ pedal pulses. No carotid bruits.  Abdomen:  Bowel sounds positive. Soft, + epigastric and LUQ tenderness, no guarding/rebound/ masses palpated. No hepatosplenomegaly.  Musculoskeletal: no clubbing / cyanosis. No joint deformity upper and lower extremities. Good ROM, no contractures. Normal muscle tone.  Skin: no rashes, lesions, ulcers. No induration Neurologic: CN 2-12 grossly intact. Sensation intact, DTR normal. Strength 5/5 in all 4.  Psychiatric: Normal judgment and insight. Alert and oriented x 4. Normal mood.    Labs on Admission: I have personally reviewed following labs and imaging studies  CBC:  Recent Labs Lab 05/12/16 1413  WBC 12.5*  HGB 14.4  HCT 41.6  MCV 83.7  PLT 263   Basic Metabolic Panel:  Recent Labs Lab 05/12/16 1413  NA 132*  K 4.0  CL 97*  CO2 26  GLUCOSE 141*  BUN 15  CREATININE 1.25*  CALCIUM 9.5   GFR: Estimated Creatinine Clearance: 61.4 mL/min (by C-G formula based on SCr of 1.25 mg/dL (H)). Liver Function Tests:  Recent Labs Lab 05/12/16 1413  AST 23  ALT 20  ALKPHOS 62  BILITOT 1.0  PROT 7.8  ALBUMIN 4.4    Recent Labs Lab 05/12/16 1413  LIPASE 312*   No results for input(s): AMMONIA in the last 168 hours. Coagulation Profile: No results for input(s): INR, PROTIME in the last 168 hours. Cardiac Enzymes: No results for input(s): CKTOTAL, CKMB, CKMBINDEX, TROPONINI  in the last 168 hours. BNP (last 3 results) No results for input(s): PROBNP in the last 8760 hours. HbA1C: No results for input(s): HGBA1C in the last 72 hours. CBG: No results for input(s): GLUCAP in the last 168 hours. Lipid Profile: No results for input(s): CHOL, HDL, LDLCALC, TRIG, CHOLHDL, LDLDIRECT in the last 72 hours. Thyroid Function Tests: No results for input(s): TSH, T4TOTAL, FREET4, T3FREE, THYROIDAB in the last 72 hours. Anemia Panel: No results for input(s): VITAMINB12, FOLATE, FERRITIN, TIBC, IRON, RETICCTPCT in the last 72 hours. Urine analysis:    Component Value Date/Time   COLORURINE YELLOW 02/03/2014 0905   APPEARANCEUR CLEAR 02/03/2014 0905   LABSPEC 1.017 02/03/2014 0905   PHURINE 6.5 02/03/2014 0905   GLUCOSEU NEGATIVE 02/03/2014 0905   HGBUR NEGATIVE 02/03/2014 0905   BILIRUBINUR neg 04/22/2016 1027   KETONESUR NEGATIVE 02/03/2014 0905  PROTEINUR neg 04/22/2016 1027   PROTEINUR NEGATIVE 02/03/2014 0905   UROBILINOGEN negative 04/22/2016 1027   UROBILINOGEN 0.2 02/03/2014 0905   NITRITE neg 04/22/2016 1027   NITRITE NEGATIVE 02/03/2014 0905   LEUKOCYTESUR Negative 04/22/2016 1027    Radiological Exams on Admission: No results found.   Assessment/Plan Principal Problem:   Acute pancreatitis Admit to MedSurg/Inpatient. NPO tonight. Continue IVF. Continue hydromorphone 1 mg IVP q 3 hours as needed for pain. Famotidine IVPB q 12 hrs. Continue Zofran 1 mg IVP q 8 hrs PRN.  Follow up CBC, CMP and lipase level in a.m.  Active Problems:   Essential hypertension Continue carvedilol in am if tolerating oral intake. Metoprolol if still NPO in AM Monitor blood pressure.    Hyperlipidemia Check triglycerides level in am. Hold atorvastatin for now. Monitor LFT's.    Coronary artery disease Resume betablocker, aspirin, Plavix in AM. Resume statin once pain free.    Hyponatremia Continue IVF. Follow up sodium level in AM.    DVT  prophylaxis: Lovenox. Code Status: Full code. Family Communication: His wife, Samara DeistKathryn, was present in the room. Disposition Plan: Admit for treatment with NPO, analgesics, IVF and lab follow up. Consults called:  Admission status: Inpatient/MedSurg.   Bobette Moavid Manuel Sarra Rachels MD [facility] Pager [phone].  If 7PM-7AM, please contact night-coverage [email or web address] Password [email or web address]  05/12/2016, 7:32 PM

## 2016-05-12 NOTE — ED Notes (Signed)
Awaiting for bolus to finish prior to starting ns

## 2016-05-13 ENCOUNTER — Inpatient Hospital Stay (HOSPITAL_COMMUNITY): Payer: Medicare Other

## 2016-05-13 DIAGNOSIS — K8500 Idiopathic acute pancreatitis without necrosis or infection: Principal | ICD-10-CM

## 2016-05-13 LAB — CBC WITH DIFFERENTIAL/PLATELET
Basophils Absolute: 0 10*3/uL (ref 0.0–0.1)
Basophils Relative: 0 %
EOS PCT: 2 %
Eosinophils Absolute: 0.1 10*3/uL (ref 0.0–0.7)
HCT: 35.9 % — ABNORMAL LOW (ref 39.0–52.0)
Hemoglobin: 11.9 g/dL — ABNORMAL LOW (ref 13.0–17.0)
LYMPHS ABS: 1 10*3/uL (ref 0.7–4.0)
LYMPHS PCT: 11 %
MCH: 28.4 pg (ref 26.0–34.0)
MCHC: 33.1 g/dL (ref 30.0–36.0)
MCV: 85.7 fL (ref 78.0–100.0)
MONO ABS: 0.8 10*3/uL (ref 0.1–1.0)
MONOS PCT: 9 %
Neutro Abs: 7.2 10*3/uL (ref 1.7–7.7)
Neutrophils Relative %: 78 %
PLATELETS: 191 10*3/uL (ref 150–400)
RBC: 4.19 MIL/uL — ABNORMAL LOW (ref 4.22–5.81)
RDW: 12.5 % (ref 11.5–15.5)
WBC: 9.2 10*3/uL (ref 4.0–10.5)

## 2016-05-13 LAB — COMPREHENSIVE METABOLIC PANEL
ALK PHOS: 50 U/L (ref 38–126)
ALT: 14 U/L — ABNORMAL LOW (ref 17–63)
ANION GAP: 7 (ref 5–15)
AST: 13 U/L — ABNORMAL LOW (ref 15–41)
Albumin: 3.4 g/dL — ABNORMAL LOW (ref 3.5–5.0)
BILIRUBIN TOTAL: 0.8 mg/dL (ref 0.3–1.2)
BUN: 13 mg/dL (ref 6–20)
CO2: 27 mmol/L (ref 22–32)
Calcium: 8.3 mg/dL — ABNORMAL LOW (ref 8.9–10.3)
Chloride: 104 mmol/L (ref 101–111)
Creatinine, Ser: 1.24 mg/dL (ref 0.61–1.24)
GLUCOSE: 88 mg/dL (ref 65–99)
Potassium: 4.4 mmol/L (ref 3.5–5.1)
SODIUM: 138 mmol/L (ref 135–145)
Total Protein: 6 g/dL — ABNORMAL LOW (ref 6.5–8.1)

## 2016-05-13 LAB — LIPASE, BLOOD: LIPASE: 70 U/L — AB (ref 11–51)

## 2016-05-13 LAB — TRIGLYCERIDES: TRIGLYCERIDES: 102 mg/dL (ref <150)

## 2016-05-13 LAB — GLUCOSE, CAPILLARY
GLUCOSE-CAPILLARY: 109 mg/dL — AB (ref 65–99)
Glucose-Capillary: 85 mg/dL (ref 65–99)
Glucose-Capillary: 86 mg/dL (ref 65–99)
Glucose-Capillary: 95 mg/dL (ref 65–99)
Glucose-Capillary: 97 mg/dL (ref 65–99)

## 2016-05-13 NOTE — Progress Notes (Signed)
Patient ID: David Cantu, male   DOB: 11/14/1955, 60 y.o.   MRN: [id number]    PROGRESS NOTE    David Cantu  [id number] DOB: 10/31/1955 DOA: 05/12/2016  PCP: Margaree MackintoshBAXLEY,MARY J, MD   Brief Narrative:  60 y.o. male with medical history significant of osteoarthritis, CAD, hypertension hyperlipidemia who is coming to the ER due to abdominal pain one day in duration.   Assessment & Plan:   Principal Problem:   Acute pancreatitis - unclear etiology at this time, pt tells me he had one other episode of pancreatitis few years back but etiology was never determined - he denies hx of gallstones and alcohol use but did at one time have triglycerides as high as 700's - will ask for abd US, lipid panel  - continue on IVF, analgesia as needed - advance diet to full liquids   Active Problems:   Essential hypertension - reasonably stable so far    Hyperlipidemia - hold PO meds until we see how pt tolerates po intake     Hyponatremia and pre renal azotemia - in the setting of acute pancreatitis - improving with IVF    Coronary artery disease - no chest pains   DVT prophylaxis: Lovenox SQ Code Status: Full  Family Communication: Patient at bedside  Disposition Plan: Home in 1-2 days  Consultants:   None  Procedures:   None  Antimicrobials:   None   Subjective: Still some left sided pain.   Objective: Vitals:   05/12/16 1945 05/12/16 2000 05/12/16 2100 05/13/16 0608  BP: 128/72 125/73 134/71 (!) 144/77  Pulse: 73 82 83 71  Resp:   18   Temp:   98.5 F (36.9 C) 98.9 F (37.2 C)  TempSrc:   Axillary Oral  SpO2: 98% 99% 99% 96%  Weight:  81.6 kg (180 lb)    Height:  5\' 6"  (1.676 m)      Intake/Output Summary (Last 24 hours) at 05/13/16 1045 Last data filed at 05/13/16 0853  Gross per 24 hour  Intake             3250 ml  Output              700 ml  Net             2550 ml   Filed Weights   05/12/16 1408 05/12/16 2000  Weight: 81.6 kg (180 lb) 81.6 kg  (180 lb)   Examination:  General exam: Appears calm and comfortable  Respiratory system: Respiratory effort normal. Cardiovascular system: S1 & S2 heard, RRR. No JVD, murmurs, rubs, gallops or clicks. No pedal edema. Gastrointestinal system: Abdomen is nondistended, soft and just lightly tender in the LUQ. No organomegaly or masses felt.  Central nervous system: Alert and oriented. No focal neurological deficits. Extremities: Symmetric 5 x 5 power. Skin: No rashes, lesions or ulcers Psychiatry: Judgement and insight appear normal. Mood & affect appropriate.   Data Reviewed: I have personally reviewed following labs and imaging studies  CBC:  Recent Labs Lab 05/12/16 1413 05/13/16 0542  WBC 12.5* 9.2  NEUTROABS  --  7.2  HGB 14.4 11.9*  HCT 41.6 35.9*  MCV 83.7 85.7  PLT 263 191   Basic Metabolic Panel:  Recent Labs Lab 05/12/16 1413 05/13/16 0542  NA 132* 138  K 4.0 4.4  CL 97* 104  CO2 26 27  GLUCOSE 141* 88  BUN 15 13  CREATININE 1.25* 1.24  CALCIUM 9.5 8.3*   Liver Function Tests:  Recent Labs Lab 05/12/16 1413 05/13/16 0542  AST 23 13*  ALT 20 14*  ALKPHOS 62 50  BILITOT 1.0 0.8  PROT 7.8 6.0*  ALBUMIN 4.4 3.4*    Recent Labs Lab 05/12/16 1413  LIPASE 312*   CBG:  Recent Labs Lab 05/13/16 0031 05/13/16 0610  GLUCAP 95 86   Radiology Studies: No results found.  Scheduled Meds: . enoxaparin (LOVENOX) injection  40 mg Subcutaneous Daily  . famotidine (PEPCID) IV  20 mg Intravenous Q12H   Continuous Infusions: . sodium chloride       LOS: 1 day   Time spent: 20 minutes   Debbora Presto, MD [facility] Pager [phone]  If 7PM-7AM, please contact night-coverage [email or web address] Password [email or web address] 05/13/2016, 10:45 AM

## 2016-05-14 LAB — URINALYSIS, ROUTINE W REFLEX MICROSCOPIC
Bilirubin Urine: NEGATIVE
GLUCOSE, UA: NEGATIVE mg/dL
HGB URINE DIPSTICK: NEGATIVE
KETONES UR: NEGATIVE mg/dL
LEUKOCYTES UA: NEGATIVE
Nitrite: NEGATIVE
PH: 6 (ref 5.0–8.0)
Protein, ur: NEGATIVE mg/dL
Specific Gravity, Urine: 1.018 (ref 1.005–1.030)

## 2016-05-14 LAB — CBC WITH DIFFERENTIAL/PLATELET
BASOS ABS: 0 10*3/uL (ref 0.0–0.1)
BASOS PCT: 0 %
EOS ABS: 0.2 10*3/uL (ref 0.0–0.7)
EOS PCT: 2 %
HCT: 33.7 % — ABNORMAL LOW (ref 39.0–52.0)
HEMOGLOBIN: 11.1 g/dL — AB (ref 13.0–17.0)
Lymphocytes Relative: 9 %
Lymphs Abs: 0.9 10*3/uL (ref 0.7–4.0)
MCH: 28.2 pg (ref 26.0–34.0)
MCHC: 32.9 g/dL (ref 30.0–36.0)
MCV: 85.5 fL (ref 78.0–100.0)
Monocytes Absolute: 0.8 10*3/uL (ref 0.1–1.0)
Monocytes Relative: 8 %
NEUTROS PCT: 81 %
Neutro Abs: 7.8 10*3/uL — ABNORMAL HIGH (ref 1.7–7.7)
PLATELETS: 186 10*3/uL (ref 150–400)
RBC: 3.94 MIL/uL — AB (ref 4.22–5.81)
RDW: 12.2 % (ref 11.5–15.5)
WBC: 9.7 10*3/uL (ref 4.0–10.5)

## 2016-05-14 LAB — GLUCOSE, CAPILLARY
GLUCOSE-CAPILLARY: 98 mg/dL (ref 65–99)
GLUCOSE-CAPILLARY: 111 mg/dL — AB (ref 65–99)
GLUCOSE-CAPILLARY: 130 mg/dL — AB (ref 65–99)
Glucose-Capillary: 106 mg/dL — ABNORMAL HIGH (ref 65–99)

## 2016-05-14 LAB — COMPREHENSIVE METABOLIC PANEL
ALT: 11 U/L — AB (ref 17–63)
AST: 14 U/L — AB (ref 15–41)
Albumin: 3.2 g/dL — ABNORMAL LOW (ref 3.5–5.0)
Alkaline Phosphatase: 48 U/L (ref 38–126)
Anion gap: 8 (ref 5–15)
BILIRUBIN TOTAL: 1 mg/dL (ref 0.3–1.2)
BUN: 12 mg/dL (ref 6–20)
CO2: 25 mmol/L (ref 22–32)
CREATININE: 1.02 mg/dL (ref 0.61–1.24)
Calcium: 8.3 mg/dL — ABNORMAL LOW (ref 8.9–10.3)
Chloride: 104 mmol/L (ref 101–111)
Glucose, Bld: 85 mg/dL (ref 65–99)
Potassium: 3.7 mmol/L (ref 3.5–5.1)
Sodium: 137 mmol/L (ref 135–145)
TOTAL PROTEIN: 6.1 g/dL — AB (ref 6.5–8.1)

## 2016-05-14 LAB — LIPASE, BLOOD: LIPASE: 32 U/L (ref 11–51)

## 2016-05-14 MED ORDER — ONDANSETRON 4 MG PO TBDP: 4.0000 mg | ORAL_TABLET | Freq: Three times a day (TID) | ORAL | Status: DC | PRN

## 2016-05-14 MED ORDER — FAMOTIDINE 20 MG PO TABS
20.0000 mg | ORAL_TABLET | Freq: Two times a day (BID) | ORAL | Status: DC
  Administered 2016-05-14 – 2016-05-15 (×2): 20 mg via ORAL
  Filled 2016-05-14 (×2): qty 1

## 2016-05-14 MED ORDER — SENNOSIDES-DOCUSATE SODIUM 8.6-50 MG PO TABS
1.0000 | ORAL_TABLET | Freq: Two times a day (BID) | ORAL | Status: DC
  Administered 2016-05-14 – 2016-05-15 (×3): 1 via ORAL
  Filled 2016-05-14 (×3): qty 1

## 2016-05-14 NOTE — Progress Notes (Signed)
Patient ID: David Cantu, male   DOB: June 24, 1956, 60 y.o.   MRN: [id number]    PROGRESS NOTE    Kennen Stammer  WUJ:[id number] DOB: 13-Sep-1955 DOA: 05/12/2016  PCP: [name], MD   Brief Narrative:  60 y.o. male with medical history significant of osteoarthritis, CAD, hypertension hyperlipidemia who is coming to the ER due to abdominal pain one day in duration.   Assessment & Plan:   Principal Problem:   Acute pancreatitis - unclear etiology at this time, pt tells me he had one other episode of pancreatitis few years back but etiology was never determined - he denies hx of gallstones and alcohol use but did at one time have triglycerides as high as 700's - abd [place] with no sighs of gallstones, TG's stable and at target range - lipase is still pending, pt still with pain in the LUQ 5/10 in severity  - continue on IVF, analgesia as needed - advance diet to soft liquids   Active Problems:   Essential hypertension - reasonably stable so far    Hyperlipidemia - hold PO meds until we see how pt tolerates po intake     Hyponatremia and pre renal azotemia - in the setting of acute pancreatitis - improving with IVF     Coronary artery disease - no chest pains   DVT prophylaxis: Lovenox SQ Code Status: Full  Family Communication: Patient at bedside  Disposition Plan: Home in 1-2 days if pain better controlled   Consultants:   None  Procedures:   None  Antimicrobials:   None   Subjective: Still some left sided pain.   Objective: Vitals:   05/13/16 0608 05/13/16 1432 05/13/16 2049 05/14/16 0540  BP: (!) 144/77 104/77 120/69 (!) 146/73  Pulse: 71 85 83 82  Resp:  18 18 18   Temp: 98.9 F (37.2 C) 99.5 F (37.5 C) 98.7 F (37.1 C) 98.3 F (36.8 C)  TempSrc: Oral Oral Oral Oral  SpO2: 96% 96% 96% 95%  Weight:      Height:        Intake/Output Summary (Last 24 hours) at 05/14/16 1137 Last data filed at 05/14/16 0952  Gross per 24 hour  Intake           3208.33 ml  Output             1550 ml  Net          1658.33 ml   Filed Weights   05/12/16 1408 05/12/16 2000  Weight: 81.6 kg (180 lb) 81.6 kg (180 lb)   Examination:  General exam: Appears calm and comfortable  Respiratory system: Respiratory effort normal. Cardiovascular system: S1 & S2 heard, RRR. No JVD, murmurs, rubs, gallops or clicks. No pedal edema. Gastrointestinal system: Abdomen is nondistended, soft and tender in the LUQ. No organomegaly or masses felt.  Central nervous system: Alert and oriented. No focal neurological deficits. Extremities: Symmetric 5 x 5 power. Skin: No rashes, lesions or ulcers Psychiatry: Judgement and insight appear normal. Mood & affect appropriate.   Data Reviewed: I have personally reviewed following labs and imaging studies  CBC:  Recent Labs Lab 05/12/16 1413 05/13/16 0542 05/14/16 0722  WBC 12.5* 9.2 9.7  NEUTROABS  --  7.2 7.8*  HGB 14.4 11.9* 11.1*  HCT 41.6 35.9* 33.7*  MCV 83.7 85.7 85.5  PLT 263 191 186   Basic Metabolic Panel:  Recent Labs Lab 05/12/16 1413 05/13/16 0542 05/14/16 0722  NA 132* 138 137  K 4.0  4.4 3.7  CL 97* 104 104  CO2 26 27 25   GLUCOSE 141* 88 85  BUN 15 13 12   CREATININE 1.25* 1.24 1.02  CALCIUM 9.5 8.3* 8.3*   Liver Function Tests:  Recent Labs Lab 05/12/16 1413 05/13/16 0542 05/14/16 0722  AST 23 13* 14*  ALT 20 14* 11*  ALKPHOS 62 50 48  BILITOT 1.0 0.8 1.0  PROT 7.8 6.0* 6.1*  ALBUMIN 4.4 3.4* 3.2*    Recent Labs Lab 05/12/16 1413 05/13/16 1247 05/14/16 0722  LIPASE 312* 70* 32   CBG:  Recent Labs Lab 05/13/16 0610 05/13/16 1143 05/13/16 1703 05/13/16 2319 05/14/16 0546  GLUCAP 86 109* 85 97 98   Radiology Studies: [place] Abdomen Complete  Result Date: 05/13/2016 CLINICAL DATA:  Pancreatitis. Abdominal pain with nausea and vomiting for 4 days. EXAM: ABDOMEN ULTRASOUND COMPLETE COMPARISON:  CT 02/03/2014 FINDINGS: Gallbladder: Physiologically distended. No  gallstones or wall thickening visualized. No sonographic Murphy sign noted by sonographer. Common bile duct: Diameter: 4 mm, normal. Liver: Increased and heterogeneous parenchymal echogenicity consistent with steatosis. Focal fatty sparing adjacent the gallbladder fossa. Cyst in the left hepatic lobe measures 2.0 x 1.2 x 1.7 cm. No suspicious lesion. Normal directional flow in the main portal vein. IVC: No abnormality visualized. Pancreas: Visualized portion unremarkable.  Shins obscured by Spleen: Size and appearance within normal limits. Trace perisplenic fluid. Right Kidney: Length: 11.8 cm. Echogenicity within normal limits. No mass or hydronephrosis visualized. Left Kidney: Length: 12.2 cm. Echogenicity within normal limits. No mass or hydronephrosis visualized. Abdominal aorta: No aneurysm visualized. Other findings: None. IMPRESSION: 1. Trace fluid about the spleen likely secondary to stated history of pancreatitis. The pancreas where visualized is unremarkable in sonographic appearance. 2. Hepatic steatosis.  Hepatic cyst. Electronically Signed   By: Rubye OaksMelanie  Ehinger M.D.   On: 05/13/2016 21:40    Scheduled Meds: . enoxaparin (LOVENOX) injection  40 mg Subcutaneous Daily  . famotidine (PEPCID) IV  20 mg Intravenous Q12H  . senna-docusate  1 tablet Oral BID   Continuous Infusions: . sodium chloride 125 mL/hr at 05/14/16 1023     LOS: 2 days   Time spent: 20 minutes   Debbora PrestoMAGICK-Kenzlee Fishburn, MD [facility] Pager [phone]  If 7PM-7AM, please contact night-coverage [email or web address] Password [email or web address] 05/14/2016, 11:37 AM

## 2016-05-15 LAB — CBC
HCT: 33.6 % — ABNORMAL LOW (ref 39.0–52.0)
Hemoglobin: 11.4 g/dL — ABNORMAL LOW (ref 13.0–17.0)
MCH: 28.8 pg (ref 26.0–34.0)
MCHC: 33.9 g/dL (ref 30.0–36.0)
MCV: 84.8 fL (ref 78.0–100.0)
Platelets: 203 10*3/uL (ref 150–400)
RBC: 3.96 MIL/uL — ABNORMAL LOW (ref 4.22–5.81)
RDW: 12.1 % (ref 11.5–15.5)
WBC: 9.5 10*3/uL (ref 4.0–10.5)

## 2016-05-15 LAB — GLUCOSE, CAPILLARY
Glucose-Capillary: 95 mg/dL (ref 65–99)
Glucose-Capillary: 90 mg/dL (ref 65–99)

## 2016-05-15 LAB — LIPASE, BLOOD: Lipase: 27 U/L (ref 11–51)

## 2016-05-15 LAB — COMPREHENSIVE METABOLIC PANEL WITH GFR
ALT: 13 U/L — ABNORMAL LOW (ref 17–63)
AST: 15 U/L (ref 15–41)
Albumin: 3.1 g/dL — ABNORMAL LOW (ref 3.5–5.0)
Alkaline Phosphatase: 47 U/L (ref 38–126)
Anion gap: 7 (ref 5–15)
BUN: 10 mg/dL (ref 6–20)
CO2: 26 mmol/L (ref 22–32)
Calcium: 8.7 mg/dL — ABNORMAL LOW (ref 8.9–10.3)
Chloride: 103 mmol/L (ref 101–111)
Creatinine, Ser: 1 mg/dL (ref 0.61–1.24)
GFR calc Af Amer: >60 mL/min
GFR calc non Af Amer: >60 mL/min
Glucose, Bld: 101 mg/dL — ABNORMAL HIGH (ref 65–99)
Potassium: 4.4 mmol/L (ref 3.5–5.1)
Sodium: 136 mmol/L (ref 135–145)
Total Bilirubin: 0.6 mg/dL (ref 0.3–1.2)
Total Protein: 6.2 g/dL — ABNORMAL LOW (ref 6.5–8.1)

## 2016-05-15 MED ORDER — ASPIRIN 81 MG PO CHEW
81.0000 mg | CHEWABLE_TABLET | Freq: Every day | ORAL | Status: DC
  Administered 2016-05-15: 81 mg via ORAL
  Filled 2016-05-15: qty 1

## 2016-05-15 MED ORDER — CARVEDILOL 6.25 MG PO TABS
6.2500 mg | ORAL_TABLET | Freq: Two times a day (BID) | ORAL | Status: DC
  Administered 2016-05-15: 6.25 mg via ORAL
  Filled 2016-05-15: qty 1

## 2016-05-15 MED ORDER — CLOPIDOGREL BISULFATE 75 MG PO TABS
75.0000 mg | ORAL_TABLET | Freq: Every day | ORAL | Status: DC
  Administered 2016-05-15: 75 mg via ORAL
  Filled 2016-05-15: qty 1

## 2016-05-15 MED ORDER — PANTOPRAZOLE SODIUM 40 MG PO TBEC
40.0000 mg | DELAYED_RELEASE_TABLET | Freq: Every day | ORAL | Status: DC
  Administered 2016-05-15: 40 mg via ORAL
  Filled 2016-05-15: qty 1

## 2016-05-15 MED ORDER — LISINOPRIL 10 MG PO TABS
10.0000 mg | ORAL_TABLET | Freq: Every day | ORAL | Status: DC
  Administered 2016-05-15: 10 mg via ORAL
  Filled 2016-05-15: qty 1

## 2016-05-15 NOTE — Discharge Instructions (Signed)

## 2016-05-15 NOTE — Discharge Summary (Signed)
Physician Discharge Summary  David MayerLawrence Cantu ZOX:[id number] DOB: 06/13/1956 DOA: 05/12/2016  PCP: Margaree MackintoshBAXLEY,MARY J, MD  Admit date: 05/12/2016 Discharge date: 05/15/2016  Recommendations for Outpatient Follow-up:  1. Pt will need to follow up with PCP in 2-3 weeks post discharge  Discharge Diagnoses:  Principal Problem:   Acute pancreatitis Active Problems:   Essential hypertension   Hyperlipidemia   Hyponatremia   Coronary artery disease  Discharge Condition: Stable  Diet recommendation: Heart healthy diet discussed in details   History of present illness:  60 y.o.malewith medical history significant of osteoarthritis, CAD, hypertension hyperlipidemia who is coming to the ER due to abdominal pain one day in duration.   Assessment & Plan:   Principal Problem:   Acute pancreatitis - unclear etiology at this time, pt tells me he had one other episode of pancreatitis few years back but etiology was never determined - he denies hx of gallstones and alcohol use but did at one time have triglycerides as high as 700's - abd [place] with no sighs of gallstones, TG's stable and at target range - lipase is now WNL, pt tolerating diet well, wants to go home and this is reasonable   Active Problems:   Essential hypertension - reasonably stable so far    Hyperlipidemia - resume home medical regimen     Hyponatremia and pre renal azotemia - in the setting of acute pancreatitis - improving with IVF  - IVF stopped     Coronary artery disease - no chest pains  - resume home medical regimen   DVT prophylaxis: Lovenox SQ Code Status: Full  Family Communication: Patient at bedside  Disposition Plan: Home   Consultants:   None  Procedures:   None  Antimicrobials:   None    Procedures/Studies: [place] Abdomen Complete  Result Date: 05/13/2016 CLINICAL DATA:  Pancreatitis. Abdominal pain with nausea and vomiting for 4 days. EXAM: ABDOMEN ULTRASOUND COMPLETE  COMPARISON:  CT 02/03/2014 FINDINGS: Gallbladder: Physiologically distended. No gallstones or wall thickening visualized. No sonographic Murphy sign noted by sonographer. Common bile duct: Diameter: 4 mm, normal. Liver: Increased and heterogeneous parenchymal echogenicity consistent with steatosis. Focal fatty sparing adjacent the gallbladder fossa. Cyst in the left hepatic lobe measures 2.0 x 1.2 x 1.7 cm. No suspicious lesion. Normal directional flow in the main portal vein. IVC: No abnormality visualized. Pancreas: Visualized portion unremarkable.  Shins obscured by Spleen: Size and appearance within normal limits. Trace perisplenic fluid. Right Kidney: Length: 11.8 cm. Echogenicity within normal limits. No mass or hydronephrosis visualized. Left Kidney: Length: 12.2 cm. Echogenicity within normal limits. No mass or hydronephrosis visualized. Abdominal aorta: No aneurysm visualized. Other findings: None. IMPRESSION: 1. Trace fluid about the spleen likely secondary to stated history of pancreatitis. The pancreas where visualized is unremarkable in sonographic appearance. 2. Hepatic steatosis.  Hepatic cyst. Electronically Signed   By: Rubye OaksMelanie  Ehinger M.D.   On: 05/13/2016 21:40     Discharge Exam: Vitals:   05/14/16 2058 05/15/16 0606  BP: (!) 160/84 (!) 159/76  Pulse: 78 98  Resp: 17 18  Temp: 99.6 F (37.6 C) 99.5 F (37.5 C)   Vitals:   05/14/16 0540 05/14/16 1426 05/14/16 2058 05/15/16 0606  BP: (!) 146/73 (!) 172/78 (!) 160/84 (!) 159/76  Pulse: 82 77 78 98  Resp: 18 17 17 18   Temp: 98.3 F (36.8 C) 98.7 F (37.1 C) 99.6 F (37.6 C) 99.5 F (37.5 C)  TempSrc: Oral Oral Oral Oral  SpO2: 95% 93% 94%  90%  Weight:      Height:        General: Pt is alert, follows commands appropriately, not in acute distress Cardiovascular: Regular rate and rhythm, S1/S2 +, no murmurs, no rubs, no gallops Respiratory: Clear to auscultation bilaterally, no wheezing, no crackles, no  rhonchi Abdominal: Soft, non tender, non distended, bowel sounds +, no guarding Extremities: no edema, no cyanosis, pulses palpable bilaterally DP and PT Neuro: Grossly nonfocal  Discharge Instructions  Discharge Instructions    Diet - low sodium heart healthy    Complete by:  As directed    Increase activity slowly    Complete by:  As directed        Medication List    TAKE these medications   aspirin 81 MG chewable tablet Chew 1 tablet (81 mg total) by mouth daily.   atorvastatin 80 MG tablet Commonly known as:  LIPITOR Take 1 tablet (80 mg total) by mouth daily at 6 PM.   carvedilol 6.25 MG tablet Commonly known as:  COREG Take 1 tablet (6.25 mg total) by mouth 2 (two) times daily with a meal.   clopidogrel 75 MG tablet Commonly known as:  PLAVIX Take 1 tablet (75 mg total) by mouth daily with breakfast.   diphenhydramine-acetaminophen 25-500 MG Tabs tablet Commonly known as:  TYLENOL PM Take 2 tablets by mouth at bedtime as needed.   lisinopril 10 MG tablet Commonly known as:  PRINIVIL,ZESTRIL Take 1 tablet (10 mg total) by mouth daily.   nitroGLYCERIN 0.4 MG SL tablet Commonly known as:  NITROSTAT Place 1 tablet (0.4 mg total) under the tongue every 5 (five) minutes as needed for chest pain.   pantoprazole 40 MG tablet Commonly known as:  PROTONIX Take 1 tablet (40 mg total) by mouth daily.      Follow-up Information    [name], MD .   Specialty:  Internal Medicine Contact information: [address] [place] [address] [phone]        Debbora Presto, MD. Call today.   Specialty:  Internal Medicine Why:  call me with questions [phone] Contact information: [address] [place] [address] [phone]            The results of significant diagnostics from this hospitalization (including imaging, microbiology, ancillary and laboratory) are listed below for reference.      Microbiology: No results found for this or any previous visit (from the past 240 hour(s)).   Labs: Basic Metabolic Panel:  Recent Labs Lab 05/12/16 1413 05/13/16 0542 05/14/16 0722 05/15/16 0443  NA 132* 138 137 136  K 4.0 4.4 3.7 4.4  CL 97* 104 104 103  CO2 26 27 25 26   GLUCOSE 141* 88 85 101*  BUN 15 13 12 10   CREATININE 1.25* 1.24 1.02 1.00  CALCIUM 9.5 8.3* 8.3* 8.7*   Liver Function Tests:  Recent Labs Lab 05/12/16 1413 05/13/16 0542 05/14/16 0722 05/15/16 0443  AST 23 13* 14* 15  ALT 20 14* 11* 13*  ALKPHOS 62 50 48 47  BILITOT 1.0 0.8 1.0 0.6  PROT 7.8 6.0* 6.1* 6.2*  ALBUMIN 4.4 3.4* 3.2* 3.1*    Recent Labs Lab 05/12/16 1413 05/13/16 1247 05/14/16 0722 05/15/16 0443  LIPASE 312* 70* 32 27   CBC:  Recent Labs Lab 05/12/16 1413 05/13/16 0542 05/14/16 0722 05/15/16 0443  WBC 12.5* 9.2 9.7 9.5  NEUTROABS  --  7.2 7.8*  --   HGB 14.4 11.9* 11.1* 11.4*  HCT 41.6 35.9* 33.7* 33.6*  MCV 83.7 85.7 85.5 84.8  PLT 263 191 186 203    CBG:  Recent Labs Lab 05/14/16 0546 05/14/16 1214 05/14/16 1741 05/15/16 0000 05/15/16 0605  GLUCAP 98 111* 130* 106* 95     SIGNED: Time coordinating discharge: 30 minutes  David Mcmains, MD  [facility] 05/15/2016, 8:51 AM Pager [phone]  If 7PM-7AM, please contact night-coverage [email or web address] Password [email or web address]

## 2016-05-15 NOTE — Progress Notes (Signed)
Patient discharged to home with instructions given 

## 2016-06-09 ENCOUNTER — Encounter: Payer: Self-pay | Admitting: Internal Medicine

## 2016-06-09 ENCOUNTER — Ambulatory Visit (INDEPENDENT_AMBULATORY_CARE_PROVIDER_SITE_OTHER): Payer: Medicare Other | Admitting: Internal Medicine

## 2016-06-09 VITALS — BP 128/66 | HR 64 | Temp 96.9°F | Ht 66.0 in | Wt 176.0 lb

## 2016-06-09 DIAGNOSIS — I10 Essential (primary) hypertension: Secondary | ICD-10-CM | POA: Diagnosis not present

## 2016-06-09 DIAGNOSIS — M19011 Primary osteoarthritis, right shoulder: Secondary | ICD-10-CM | POA: Diagnosis not present

## 2016-06-09 DIAGNOSIS — K859 Acute pancreatitis without necrosis or infection, unspecified: Secondary | ICD-10-CM | POA: Diagnosis not present

## 2016-06-09 DIAGNOSIS — Z8719 Personal history of other diseases of the digestive system: Secondary | ICD-10-CM

## 2016-06-09 DIAGNOSIS — E781 Pure hyperglyceridemia: Secondary | ICD-10-CM

## 2016-06-09 DIAGNOSIS — Z955 Presence of coronary angioplasty implant and graft: Secondary | ICD-10-CM

## 2016-06-09 LAB — CBC WITH DIFFERENTIAL/PLATELET
BASOS ABS: 0 {cells}/uL (ref 0–200)
Basophils Relative: 0 %
EOS ABS: 630 {cells}/uL — AB (ref 15–500)
EOS PCT: 9 %
HEMATOCRIT: 41.8 % (ref 38.5–50.0)
HEMOGLOBIN: 14 g/dL (ref 13.2–17.1)
LYMPHS ABS: 1050 {cells}/uL (ref 850–3900)
Lymphocytes Relative: 15 %
MCH: 28.6 pg (ref 27.0–33.0)
MCHC: 33.5 g/dL (ref 32.0–36.0)
MCV: 85.5 fL (ref 80.0–100.0)
MONO ABS: 630 {cells}/uL (ref 200–950)
MPV: 9.6 fL (ref 7.5–12.5)
Monocytes Relative: 9 %
NEUTROS PCT: 67 %
Neutro Abs: 4690 cells/uL (ref 1500–7800)
Platelets: 231 10*3/uL (ref 140–400)
RBC: 4.89 MIL/uL (ref 4.20–5.80)
RDW: 13.5 % (ref 11.0–15.0)
WBC: 7 10*3/uL (ref 3.8–10.8)

## 2016-06-09 NOTE — Progress Notes (Signed)
   Subjective:    Patient ID: David MayerLawrence Grivas, male    DOB: 01/21/1956, 60 y.o.   MRN: [id number]  HPI  Was Hospitalize recently with pancreatitisFrom October 12 through October 15. He was told follow-up. 2-3 weeks after discharge. This is his second episode of pancreatitis. [place] in [place] when he became ill. He became diaphoretic and had abdominal pain. He knew what was happening because of a previous episode. He was able to get back home and presented to the emergency department. He had an ultrasound on October 13 showed no gallstones or gallbladder wall thickening. Liver was consistent with steatosis. He was subsequently admitted. On admission lipase was 312. Triglycerides October 13 were normal at 102.  Initial episode of pancreatitis was July 2015. Not sure what caused his pancreatitis at that time except he had significant hypertriglyceridemia which could've been a cause. He used to take gemfibrozil but that has been discontinued and he is now on Lipitor.  He has a history of coronary disease status post drug-eluting stent placement right coronary artery December 2016 by Dr. Anne FuSkains  Hx hypertriglyceridemia treated with Lipitor 80 mg daily.  Review of Systems no complaints at the present time     Objective:   Physical Exam Abdomen is soft nondistended without hepatosplenomegaly masses or tenderness. Chest clear to auscultation. Cardiac exam regular rate and rhythm normal S1 and S2. Extremities without edema.       Assessment & Plan:  Recurrent pancreatitis  History of coronary artery disease status post drug-eluting stent to right coronary artery December 2016  Hypertriglyceridemia  Essential hypertension  History of shoulder arthropathy  Plan: Refer to gastroenterologist for consideration of endoscopic ultrasound if necessary and/or ERCP if necessary. He may need an MRI/MRA of the pancreas as well.  He is anxious about this.  Spent 25 minutes speaking with  him about these issues.

## 2016-06-10 LAB — COMPREHENSIVE METABOLIC PANEL
ALBUMIN: 4.6 g/dL (ref 3.6–5.1)
ALT: 28 U/L (ref 9–46)
AST: 22 U/L (ref 10–35)
Alkaline Phosphatase: 71 U/L (ref 40–115)
BUN: 16 mg/dL (ref 7–25)
CALCIUM: 9.8 mg/dL (ref 8.6–10.3)
CHLORIDE: 97 mmol/L — AB (ref 98–110)
CO2: 25 mmol/L (ref 20–31)
Creat: 1.21 mg/dL (ref 0.70–1.25)
Glucose, Bld: 101 mg/dL — ABNORMAL HIGH (ref 65–99)
POTASSIUM: 4.6 mmol/L (ref 3.5–5.3)
Sodium: 134 mmol/L — ABNORMAL LOW (ref 135–146)
TOTAL PROTEIN: 7.3 g/dL (ref 6.1–8.1)
Total Bilirubin: 0.6 mg/dL (ref 0.2–1.2)

## 2016-06-10 LAB — LIPASE: Lipase: 25 U/L (ref 7–60)

## 2016-06-10 LAB — AMYLASE: AMYLASE: 57 U/L (ref 0–105)

## 2016-06-15 ENCOUNTER — Ambulatory Visit (INDEPENDENT_AMBULATORY_CARE_PROVIDER_SITE_OTHER): Payer: Medicare Other | Admitting: Physician Assistant

## 2016-06-15 ENCOUNTER — Encounter: Payer: Self-pay | Admitting: Physician Assistant

## 2016-06-15 VITALS — BP 120/72 | HR 60 | Ht 66.0 in | Wt 178.0 lb

## 2016-06-15 DIAGNOSIS — Z8719 Personal history of other diseases of the digestive system: Secondary | ICD-10-CM

## 2016-06-15 MED ORDER — PANCRELIPASE (LIP-PROT-AMYL) 40000-136000 UNITS PO CPEP: ORAL_CAPSULE | ORAL | 0 refills | Status: DC

## 2016-06-15 NOTE — Patient Instructions (Addendum)
[facility] Zenpep.  We have provided you a savings card  For a 30 day supply.  We have also given you samples.

## 2016-06-15 NOTE — Progress Notes (Signed)
Chief Complaint: H/o Pancreatitis  HPI:  [name] is a 60 year old Caucasian male with a past medical history significant for acute pancreatitis, arthritis, CAD, heart murmur, hypertension, migraines and prediabetes, who was referred to me by Margaree MackintoshBaxley, Mary J, MD for further investigation after an acute episode of pancreatitis recently. He has followed with Dr. Leone PayorGessner in the past for screening colonoscopy.    According to chart review patient was recently seen in the hospital from 05/12/16 through 05/15/16 for an episode of acute pancreatitis. This was the patient's second episode of pancreatitis over the past 2 years. His first diagnosis was in 2015 and at that time patient was found to have triglycerides high in the high 600s. During his most recent episode his triglycerides were no longer elevated. He did have an abdominal ultrasound at that time which showed no signs of gallstones. At time of admission his lipase was elevated at 312 and ultrasound showed showed trace fluid around the spleen likely secondary to stated history of pancreatitis. The pancreas was visualized and unremarkable on sonographic appearance. Patient's other labs at that time showed a CMP within normal limits including liver enzymes and a CBC which was normal. Patient has had repeat labs on 06/09/2016 which now show a lipase normal at 25 and are otherwise normal.   Today, the patient tells that he was sent to our clinic for further investigation of why he is having episodes of pancreatitis. He tells me that his first episode occurred back in 2015 and was thought due to his elevated triglycerides. He was placed on medicine for this and has also changed his lifestyle and diet in order to be "healthier", and his triglycerides have come down. Most recently, as above, he was admitted for pancreatitis which "came on out of the blue". He was told there was no definitive reason for this episode. He would like to avoid episodes in the  future if he can, as they are "very uncomfortable". He has been seen by a dietitian and is currently on a very healthy diet and has an increased exercise regimen. Overall, he feels very healthy. He denies any alcohol use recently, he tells me last time he drank was the end of August at his son's wedding. He does not drink on a regular basis. He denies a history of smoking. He denies a family history of pancreatic problems.    Social history is positive for at least 3 very large cups of coffee throughout the day, he also admits that he does "not drink very much water but is trying to do better".   Patient denies fever, chills, blood in his stool, melena, change in bowel habits, fatigue, anorexia, nausea, vomiting, heartburn, reflux or increased bloating.   Past Medical History:  Diagnosis Date  . Acute pancreatitis 02/03/2014; 05/12/2016  . Arthritis    "shoulders" (05/12/2016)  . CAD (coronary artery disease)    a. L&R cath 07/09/15 normal LV function, no MR or AS, normal R heart pressure, No pulmonary HTN, 70% RCA s/p DES  . ZOXWRUEA(540.9Headache(784.0)    "couple times/month" (05/12/2016)  . Heart murmur   . Hypertension   . Migraine    "might have 5-6/year" (05/12/2016)  . Pneumonia 1960; ~ 1992  . Pre-diabetes   . Ringing in the ears    "continuous ringing in my ears" (05/12/2016)  . Shingles     Past Surgical History:  Procedure Laterality Date  . CARDIAC CATHETERIZATION N/A 07/09/2015   Procedure: Right/Left Heart Cath and  Coronary Angiography;  Surgeon: Jake BatheMark C Skains, MD;  [facility];  Service: Cardiovascular;  Laterality: N/A;  . CARDIAC CATHETERIZATION N/A 07/09/2015   Procedure: Intravascular Pressure Wire/FFR Study;  Surgeon: Kathleene Hazelhristopher D McAlhany, MD;  Location[facility] LAB;  Service: Cardiovascular;  Laterality: N/A;  . CARDIAC CATHETERIZATION N/A 07/09/2015   Procedure: Coronary Stent Intervention;  Surgeon: Kathleene Hazelhristopher D McAlhany, MD;  Location: [facility] INVASIVE CV  LAB;  Service: Cardiovascular;  Laterality: N/A;  Distal RCA  . SHOULDER ARTHROSCOPY W/ ROTATOR CUFF REPAIR Left 2011  . TRACHEOSTOMY  1960  . TRACHEOSTOMY CLOSURE  1960    Current Outpatient Prescriptions  Medication Sig Dispense Refill  . aspirin 81 MG chewable tablet Chew 1 tablet (81 mg total) by mouth daily. 30 tablet 11  . atorvastatin (LIPITOR) 80 MG tablet Take 1 tablet (80 mg total) by mouth daily at 6 PM. 30 tablet 11  . carvedilol (COREG) 6.25 MG tablet Take 1 tablet (6.25 mg total) by mouth 2 (two) times daily with a meal. 60 tablet 11  . clopidogrel (PLAVIX) 75 MG tablet Take 1 tablet (75 mg total) by mouth daily with breakfast. 30 tablet 11  . diphenhydramine-acetaminophen (TYLENOL PM) 25-500 MG TABS tablet Take 2 tablets by mouth at bedtime as needed.    Marland Kitchen. lisinopril (PRINIVIL,ZESTRIL) 10 MG tablet Take 1 tablet (10 mg total) by mouth daily. 30 tablet 11  . nitroGLYCERIN (NITROSTAT) 0.4 MG SL tablet Place 1 tablet (0.4 mg total) under the tongue every 5 (five) minutes as needed for chest pain. 25 tablet 3  . pantoprazole (PROTONIX) 40 MG tablet Take 1 tablet (40 mg total) by mouth daily. 30 tablet 11   No current facility-administered medications for this visit.     Allergies as of 06/15/2016  . (No Known Allergies)    Family History  Problem Relation Age of Onset  . Lung cancer Mother   . Lung cancer Father   . Colon cancer Neg Hx     Social History   Social History  . Marital status: Married    Spouse name: N/A  . Number of children: N/A  . Years of education: N/A   Occupational History  . Not on file.   Social History Main Topics  . Smoking status: Never Smoker  . Smokeless tobacco: Never Used  . Alcohol use 0.0 oz/week     Comment: 05/12/2016 "might have a few drinks/month"  . Drug use: No  . Sexual activity: Yes   Other Topics Concern  . Not on file   Social History Narrative  . No narrative on file    Review of Systems:      Constitutional: No weight loss, fever, chills, weakness or fatigue HEENT: Eyes: No change in vision               Ears, Nose, Throat:  No change in hearing or congestion Skin: No rash or itching Cardiovascular: No chest pain, chest pressure or palpitations   Respiratory: No SOB or cough Gastrointestinal: See HPI and otherwise negative Genitourinary: No dysuria or change in urinary frequency Neurological: No headache, dizziness or syncope Musculoskeletal: No new muscle or joint pain Hematologic: No bleeding or bruising Psychiatric: No history of depression or anxiety   Physical Exam:  Vital signs: BP 120/72 (BP Location: Left Arm, Patient Position: Sitting, Cuff Size: Normal)   Pulse 60   Ht 5\' 6"  (1.676 m)   Wt 178 lb (80.7 kg)   BMI 28.73  kg/m   General:   Pleasant Caucasian male appears to be in NAD, Well developed, Well nourished, alert and cooperative Head:  Normocephalic and atraumatic. Eyes:   PEERL, EOMI. No icterus. Conjunctiva pink. Ears:  Normal auditory acuity. Neck:  Supple Throat: Oral cavity and pharynx without inflammation, swelling or lesion. Teeth in good condition. Lungs: Respirations even and unlabored. Lungs clear to auscultation bilaterally.   No wheezes, crackles, or rhonchi.  Heart: Normal S1, S2. No MRG. Regular rate and rhythm. No peripheral edema, cyanosis or pallor.  Abdomen:  Soft, nondistended, nontender. No rebound or guarding. Normal bowel sounds. No appreciable masses or hepatomegaly. Rectal:  Not performed.  Msk:  Symmetrical without gross deformities.  Extremities:  Without edema, no deformity or joint abnormality. Normal ROM. Neurologic:  Alert and  oriented x4;  grossly normal neurologically Skin:   Dry and intact without significant lesions or rashes. Psychiatric: Oriented to person, place and time. Demonstrates good judgement and reason without abnormal affect or behaviors.  MOST RECENT LABS AND IMAGING: CBC    Component Value Date/Time     WBC 7.0 06/09/2016 1307   RBC 4.89 06/09/2016 1307   HGB 14.0 06/09/2016 1307   HCT 41.8 06/09/2016 1307   PLT 231 06/09/2016 1307   MCV 85.5 06/09/2016 1307   MCH 28.6 06/09/2016 1307   MCHC 33.5 06/09/2016 1307   RDW 13.5 06/09/2016 1307   LYMPHSABS 1,050 06/09/2016 1307   MONOABS 630 06/09/2016 1307   EOSABS 630 (H) 06/09/2016 1307   BASOSABS 0 06/09/2016 1307    CMP     Component Value Date/Time   NA 134 (L) 06/09/2016 1307   K 4.6 06/09/2016 1307   CL 97 (L) 06/09/2016 1307   CO2 25 06/09/2016 1307   GLUCOSE 101 (H) 06/09/2016 1307   BUN 16 06/09/2016 1307   CREATININE 1.21 06/09/2016 1307   CALCIUM 9.8 06/09/2016 1307   PROT 7.3 06/09/2016 1307   ALBUMIN 4.6 06/09/2016 1307   AST 22 06/09/2016 1307   ALT 28 06/09/2016 1307   ALKPHOS 71 06/09/2016 1307   BILITOT 0.6 06/09/2016 1307   GFRNONAA >60 05/15/2016 0443   GFRNONAA 64 02/03/2015 1452   GFRAA >60 05/15/2016 0443   GFRAA 74 02/03/2015 1452    EXAM: ABDOMEN ULTRASOUND COMPLETE 05/13/16  COMPARISON:  CT 02/03/2014  FINDINGS: Gallbladder: Physiologically distended. No gallstones or wall thickening visualized. No sonographic Murphy sign noted by sonographer.  Common bile duct: Diameter: 4 mm, normal.  Liver: Increased and heterogeneous parenchymal echogenicity consistent with steatosis. Focal fatty sparing adjacent the gallbladder fossa. Cyst in the left hepatic lobe measures 2.0 x 1.2 x 1.7 cm. No suspicious lesion. Normal directional flow in the main portal vein.  IVC: No abnormality visualized.  Pancreas: Visualized portion unremarkable.  Shins obscured by  Spleen: Size and appearance within normal limits. Trace perisplenic fluid.  Right Kidney: Length: 11.8 cm. Echogenicity within normal limits. No mass or hydronephrosis visualized.  Left Kidney: Length: 12.2 cm. Echogenicity within normal limits. No mass or hydronephrosis visualized.  Abdominal aorta: No aneurysm  visualized.  Other findings: None.  IMPRESSION: 1. Trace fluid about the spleen likely secondary to stated history of pancreatitis. The pancreas where visualized is unremarkable in sonographic appearance. 2. Hepatic steatosis.  Hepatic cyst.   Electronically Signed   By: Rubye Oaks M.D.   On: 05/13/2016 21:40  Assessment: 1. History of pancreatitis: Patient's first episode of pancreatitis was in 2015 and at that time he had elevated  triglycerides around 690, this was thought to be the reason for his episode of pancreatitis, he has been taking medicine since that time which has lowered his triglycerides into the normal range, most recently 102 on 05/13/16; second episode of pancreatitis in October of this year, right upper quadrant ultrasound was normal, LFTs were normal, no history of alcohol use, no family history of pancreatitis, no recent ingestion of high fatty meal, no history of smoking;  patient was told that there was no specific reason for his pancreatitis. We discussed all of this again today in fine detail. 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 Encouraged him to to continue a healthy diet and lifestyle. Discussed that we can try starting him on pancreatic enzymes to possibly decrease the chance of repeat pancreatitis. He is willing to try this.  Plan: 1. Prescribed Zenpep 40,000, 2 with a meal and 1 with a snack, also provided the patient with samples today. 2. Patient encouraged to continue healthy diet and lifestyle 3. Encouraged the patient to increase his water intake and stay well hydrated. If he feels as though he is "getting pancreatitis again", he should stop eating initially and drink plenty of fluids and then advance to clear liquid diet. Ultimately if pain worsens or patient starts with nausea or vomiting, recommend the procedure the ER. He verbalized understanding. 4. Patient to follow with Dr. Leone Payor in the next 4-6 months.  Hyacinth Meeker, PA-C [facility] 06/15/2016, 3:07 PM  Cc: [name], MD

## 2016-06-22 ENCOUNTER — Telehealth: Payer: Self-pay | Admitting: Physician Assistant

## 2016-06-22 NOTE — Telephone Encounter (Signed)
Headaches are daily. They respond to tylenol, but come right back. He did not take Zenpep today. Mild headache when he woke up. He is unsure if it is related. On Zenpep in hopes of preventing future pancreatitis attacks. Okay to take a drug holiday to see if it is related? Is this a side effect you have heard of?

## 2016-06-22 NOTE — Progress Notes (Signed)
Agree with Ms. David Cantu's management.   One addition would be to have an MRCP performed - with contrast - please order this for December or January and let me know when scheduled. Explain to patient it will let us examine for any structural abnormalities of the pancreas. If he wants to speak to me I can call him.

## 2016-06-22 NOTE — Progress Notes (Signed)
See Dr. Marvell Fullergessner's rec, MRCP with contrast order for Dec/Jan for view of possible structural abnomralities causing symptoms. Thanks-JLL

## 2016-06-25 NOTE — Patient Instructions (Signed)
Referral to our gastroenterology for further evaluation of recurrent pancreatitis. Continue same medications

## 2016-06-27 NOTE — Telephone Encounter (Signed)
Pt will try a drug holiday and then restart, he will call if the headaches return

## 2016-06-28 ENCOUNTER — Other Ambulatory Visit: Payer: Self-pay | Admitting: Physician Assistant

## 2016-07-04 ENCOUNTER — Encounter: Payer: Self-pay | Admitting: Internal Medicine

## 2016-07-04 ENCOUNTER — Ambulatory Visit (INDEPENDENT_AMBULATORY_CARE_PROVIDER_SITE_OTHER): Payer: Medicare Other | Admitting: Internal Medicine

## 2016-07-04 VITALS — BP 148/84 | HR 62 | Temp 98.5°F | Ht 66.0 in | Wt 178.0 lb

## 2016-07-04 DIAGNOSIS — K861 Other chronic pancreatitis: Secondary | ICD-10-CM | POA: Diagnosis not present

## 2016-07-04 DIAGNOSIS — L409 Psoriasis, unspecified: Secondary | ICD-10-CM | POA: Diagnosis not present

## 2016-07-04 DIAGNOSIS — J209 Acute bronchitis, unspecified: Secondary | ICD-10-CM | POA: Diagnosis not present

## 2016-07-04 DIAGNOSIS — IMO0002 Reserved for concepts with insufficient information to code with codable children: Secondary | ICD-10-CM

## 2016-07-04 DIAGNOSIS — K859 Acute pancreatitis without necrosis or infection, unspecified: Secondary | ICD-10-CM

## 2016-07-04 MED ORDER — METHYLPREDNISOLONE ACETATE 80 MG/ML IJ SUSP
80.0000 mg | Freq: Once | INTRAMUSCULAR | Status: AC
  Administered 2016-07-04: 80 mg via INTRAMUSCULAR

## 2016-07-04 MED ORDER — HYDROCODONE-HOMATROPINE 5-1.5 MG/5ML PO SYRP: 5.0000 mL | ORAL_SOLUTION | Freq: Three times a day (TID) | ORAL | 0 refills | Status: DC | PRN

## 2016-07-04 MED ORDER — LEVOFLOXACIN 500 MG PO TABS: 500.0000 mg | ORAL_TABLET | Freq: Every day | ORAL | 0 refills | Status: DC

## 2016-07-04 NOTE — Patient Instructions (Signed)
Please recontact dermatologist regarding psoriasis. Levaquin 500 milligrams daily for 10 days. Hycodan 1 teaspoon by mouth every 8 hours when necessary cough. Depo-Medrol 80 mg IM.

## 2016-07-04 NOTE — Progress Notes (Signed)
   Subjective:    Patient ID: David Cantu, male    DOB: 05/07/1956, 60 y.o.   MRN: [id number]  HPI 60 year old Male with cough x 3 weeks. Starts after drinking coffee and is all day long. Doe not wake him up. Can happen after dinner watching TV. Small amount of clear sputum. Iniatially was dry cough. Since Thanksgiving. Several relatives with similar symptoms.  Also has rash right popliteal fossa area.  With regard to recurrent pancreatitis, he was seen in gastroenterology and was placed on pancreatic enzymes. He said that these gave him what sounds like a migraine headache. He discontinued them but they suggested he restart these. He has not done so yet.    Review of Systems     Objective:   Physical Exam  Constitutional: He appears well-developed and well-nourished.  HENT:  Head: Normocephalic and atraumatic.  Right Ear: External ear normal.  Left Ear: External ear normal.  Mouth/Throat: Oropharynx is clear and moist. No oropharyngeal exudate.  Pulmonary/Chest: Effort normal and breath sounds normal. No respiratory distress. He has no wheezes. He has no rales.  Lymphadenopathy:    He has no cervical adenopathy.   Has rash right popliteal fossa area that looks like a red raised plaque.       Assessment & Plan:   Acute bronchitis or a plan: Hycodan 1 teaspoon by mouth every 8 hours when necessary cough. Levaquin 500 milligrams daily for 10 days.  Depomedrol 80 mg IM.  With regard to what looks to be psoriasis of the right popliteal fossa area, he is to recontact Dr. Doreen BeamWhitworth, dermatologist to prescribe steroid cream for him.

## 2016-07-14 ENCOUNTER — Other Ambulatory Visit: Payer: Self-pay | Admitting: Physician Assistant

## 2016-08-08 ENCOUNTER — Encounter: Payer: Self-pay | Admitting: Cardiology

## 2016-08-08 ENCOUNTER — Ambulatory Visit (INDEPENDENT_AMBULATORY_CARE_PROVIDER_SITE_OTHER): Payer: Medicare Other | Admitting: Cardiology

## 2016-08-08 VITALS — BP 126/72 | HR 74 | Ht 67.0 in | Wt 175.8 lb

## 2016-08-08 DIAGNOSIS — M25519 Pain in unspecified shoulder: Secondary | ICD-10-CM

## 2016-08-08 DIAGNOSIS — E7800 Pure hypercholesterolemia, unspecified: Secondary | ICD-10-CM | POA: Diagnosis not present

## 2016-08-08 DIAGNOSIS — I2510 Atherosclerotic heart disease of native coronary artery without angina pectoris: Secondary | ICD-10-CM

## 2016-08-08 DIAGNOSIS — Z01810 Encounter for preprocedural cardiovascular examination: Secondary | ICD-10-CM

## 2016-08-08 DIAGNOSIS — I2583 Coronary atherosclerosis due to lipid rich plaque: Secondary | ICD-10-CM

## 2016-08-08 DIAGNOSIS — Z955 Presence of coronary angioplasty implant and graft: Secondary | ICD-10-CM

## 2016-08-08 DIAGNOSIS — G8929 Other chronic pain: Secondary | ICD-10-CM

## 2016-08-08 NOTE — Patient Instructions (Signed)
Medication Instructions:  Please stop your Plavix. Continue all other medications as listed.  Follow-Up: Follow up in 1 year with Dr. Skains.  You will receive a letter in the mail 2 months before you are due.  Please call us when you receive this letter to schedule your follow up appointment.  If you need a refill on your cardiac medications before your next appointment, please call your pharmacy.  Thank you for choosing [facility]!!     
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
## 2016-08-16 ENCOUNTER — Telehealth: Payer: Self-pay

## 2016-08-16 NOTE — Telephone Encounter (Signed)
I left a message re: this is optional to try to understand why he had pancreatitis Will try to talk to him again

## 2016-08-16 NOTE — Telephone Encounter (Signed)
David Cantu would like to speak to Dr Leone PayorGessner before preceding with the MRCP to understand better why he needs it.  He is concerned his insurance won't cover it and he has been hit with a lot of medical bills lately he said.  He is aware Dr Leone PayorGessner may not be in touch with him until next week as he is out of the office after today.

## 2016-08-16 NOTE — Telephone Encounter (Signed)
-----   Message from Donata DuffPatty L Lewis, RN sent at 06/27/2016  9:57 AM EST ----- One addition would be to have an MRCP performed - with contrast - please order this for December or January and let me know when scheduled. Explain to patient it will let us examine for any structural abnormalities of the pancreas. If he wants to speak to me I can call him.

## 2016-08-23 NOTE — Telephone Encounter (Signed)
I spoke to him  Plan is:  1) Cancel MRI/MRCP for now 2) Schedule a f/u w/ me for Freb?March - next avail re: recurrent pancreatitis

## 2016-08-23 NOTE — Telephone Encounter (Signed)
Spoke with Lyman BishopLawrence , made a 09/26/16 at 10:45AM appointment to see Dr Leone PayorGessner.

## 2016-09-19 ENCOUNTER — Emergency Department (HOSPITAL_COMMUNITY): Payer: Medicare Other

## 2016-09-19 ENCOUNTER — Emergency Department (HOSPITAL_COMMUNITY)
Admission: EM | Admit: 2016-09-19 | Discharge: 2016-09-20 | Disposition: A | Discharge status: Home / Self Care | Payer: Medicare Other | Attending: Emergency Medicine | Admitting: Emergency Medicine

## 2016-09-19 ENCOUNTER — Encounter (HOSPITAL_COMMUNITY): Payer: Self-pay | Admitting: Emergency Medicine

## 2016-09-19 DIAGNOSIS — Z955 Presence of coronary angioplasty implant and graft: Secondary | ICD-10-CM | POA: Insufficient documentation

## 2016-09-19 DIAGNOSIS — Z79899 Other long term (current) drug therapy: Secondary | ICD-10-CM | POA: Diagnosis not present

## 2016-09-19 DIAGNOSIS — I10 Essential (primary) hypertension: Secondary | ICD-10-CM | POA: Diagnosis not present

## 2016-09-19 DIAGNOSIS — I2510 Atherosclerotic heart disease of native coronary artery without angina pectoris: Secondary | ICD-10-CM | POA: Insufficient documentation

## 2016-09-19 DIAGNOSIS — K861 Other chronic pancreatitis: Secondary | ICD-10-CM | POA: Diagnosis not present

## 2016-09-19 DIAGNOSIS — K859 Acute pancreatitis without necrosis or infection, unspecified: Secondary | ICD-10-CM | POA: Insufficient documentation

## 2016-09-19 DIAGNOSIS — R1012 Left upper quadrant pain: Secondary | ICD-10-CM | POA: Diagnosis present

## 2016-09-19 DIAGNOSIS — K8689 Other specified diseases of pancreas: Secondary | ICD-10-CM | POA: Diagnosis not present

## 2016-09-19 DIAGNOSIS — Z7982 Long term (current) use of aspirin: Secondary | ICD-10-CM | POA: Diagnosis not present

## 2016-09-19 LAB — COMPREHENSIVE METABOLIC PANEL
ALT: 30 U/L (ref 17–63)
AST: 24 U/L (ref 15–41)
Albumin: 4.3 g/dL (ref 3.5–5.0)
Alkaline Phosphatase: 52 U/L (ref 38–126)
Anion gap: 11 (ref 5–15)
BILIRUBIN TOTAL: 1 mg/dL (ref 0.3–1.2)
BUN: 16 mg/dL (ref 6–20)
CALCIUM: 9.7 mg/dL (ref 8.9–10.3)
CO2: 27 mmol/L (ref 22–32)
CREATININE: 1.34 mg/dL — AB (ref 0.61–1.24)
Chloride: 98 mmol/L — ABNORMAL LOW (ref 101–111)
GFR, EST NON AFRICAN AMERICAN: 55 mL/min — AB (ref >60)
Glucose, Bld: 106 mg/dL — ABNORMAL HIGH (ref 65–99)
Potassium: 3.8 mmol/L (ref 3.5–5.1)
Sodium: 136 mmol/L (ref 135–145)
TOTAL PROTEIN: 7.4 g/dL (ref 6.5–8.1)

## 2016-09-19 LAB — CBC WITH DIFFERENTIAL/PLATELET
BASOS ABS: 0 10*3/uL (ref 0.0–0.1)
BASOS PCT: 0 %
EOS ABS: 0.4 10*3/uL (ref 0.0–0.7)
Eosinophils Relative: 3 %
HCT: 40.8 % (ref 39.0–52.0)
HEMOGLOBIN: 14 g/dL (ref 13.0–17.0)
Lymphocytes Relative: 9 %
Lymphs Abs: 1.2 10*3/uL (ref 0.7–4.0)
MCH: 28.5 pg (ref 26.0–34.0)
MCHC: 34.3 g/dL (ref 30.0–36.0)
MCV: 83.1 fL (ref 78.0–100.0)
MONOS PCT: 7 %
Monocytes Absolute: 0.9 10*3/uL (ref 0.1–1.0)
NEUTROS ABS: 10.8 10*3/uL — AB (ref 1.7–7.7)
NEUTROS PCT: 81 %
PLATELETS: 236 10*3/uL (ref 150–400)
RBC: 4.91 MIL/uL (ref 4.22–5.81)
RDW: 12.5 % (ref 11.5–15.5)
WBC: 13.4 10*3/uL — AB (ref 4.0–10.5)

## 2016-09-19 LAB — LIPASE, BLOOD: LIPASE: 33 U/L (ref 11–51)

## 2016-09-19 MED ORDER — ONDANSETRON 4 MG PO TBDP
4.0000 mg | ORAL_TABLET | Freq: Once | ORAL | Status: AC
  Administered 2016-09-19: 4 mg via ORAL

## 2016-09-19 MED ORDER — ONDANSETRON 4 MG PO TBDP
ORAL_TABLET | ORAL | Status: AC
  Filled 2016-09-19: qty 1

## 2016-09-19 MED ORDER — PROMETHAZINE HCL 25 MG/ML IJ SOLN
12.5000 mg | Freq: Once | INTRAMUSCULAR | Status: AC
  Administered 2016-09-19: 12.5 mg via INTRAVENOUS
  Filled 2016-09-19: qty 1

## 2016-09-19 MED ORDER — HYDROMORPHONE HCL 2 MG/ML IJ SOLN
1.0000 mg | Freq: Once | INTRAMUSCULAR | Status: AC
  Administered 2016-09-19: 1 mg via INTRAVENOUS
  Filled 2016-09-19: qty 1

## 2016-09-19 MED ORDER — IOPAMIDOL (ISOVUE-300) INJECTION 61%
INTRAVENOUS | Status: AC
  Administered 2016-09-19: 100 mL
  Filled 2016-09-19: qty 100

## 2016-09-19 NOTE — ED Triage Notes (Signed)
Pt c/o generalized abd pain.  Onset earlier today.  St's he was here several months ago and dx with pancreatitis.  St's symptoms feel the same.  Pt actively vomiting in triage.

## 2016-09-19 NOTE — ED Provider Notes (Signed)
[facility]-EMER[facility]ENCY DEPT Provider Note   CSN: [id number] Arrival date & time: 09/19/16  1936     History   Chief Complaint Chief Complaint  Patient presents with  . Abdominal Pain    HPI David Cantu is a 61 y.o. male.  Patient with history of CAD, pancreatitis, HTN presents with LUQ pain similar to previous episodes of pancreatitis. Pain started earlier today and became severe later in the day, associated with nausea, vomiting. No fever. No respiratory symptoms.    The history is provided by the patient and the spouse. No language interpreter was used.  Abdominal Pain      Past Medical History:  Diagnosis Date  . Acute pancreatitis 02/03/2014; 05/12/2016  . Arthritis    "shoulders" (05/12/2016)  . CAD (coronary artery disease)    a. L&R cath 07/09/15 normal LV function, no MR or AS, normal R heart pressure, No pulmonary HTN, 70% RCA s/p DES  . WUJWJXBJ(478.2Headache(784.0)    "couple times/month" (05/12/2016)  . Heart murmur   . Hypertension   . Migraine    "might have 5-6/year" (05/12/2016)  . Pneumonia 1960; ~ 1992  . Pre-diabetes   . Ringing in the ears    "continuous ringing in my ears" (05/12/2016)  . Shingles     Patient Active Problem List   Diagnosis Date Noted  . Acute pancreatitis 05/12/2016  . Hyponatremia 05/12/2016  . Coronary artery disease 05/12/2016  . Essential hypertension, benign 08/07/2015  . Stented coronary artery   . Hypertensive heart disease without heart failure   . Unstable angina (HCC) 07/09/2015  . Coronary artery disease involving native coronary artery of native heart with unstable angina pectoris (HCC)   . Bilateral shoulder pain 02/03/2015  . Pruritic rash 11/20/2014  . Preventative health care 06/03/2014  . Hyperlipidemia 02/25/2014  . Post herpetic neuralgia 02/25/2014  . Dyspnea 02/13/2014  . Hx of acute pancreatitis 02/03/2014  . Essential hypertension 02/03/2014    Past Surgical History:  Procedure Laterality Date  . CARDIAC  CATHETERIZATION N/A 07/09/2015   Procedure: Right/Left Heart Cath and Coronary Angiography;  Surgeon: Jake BatheMark C Skains, MD;  Location: [facility] INVASIVE CV LAB;  Service: Cardiovascular;  Laterality: N/A;  . CARDIAC CATHETERIZATION N/A 07/09/2015   Procedure: Intravascular Pressure Wire/FFR Study;  Surgeon: Kathleene Hazelhristopher D McAlhany, MD;  [facility];  Service: Cardiovascular;  Laterality: N/A;  . CARDIAC CATHETERIZATION N/A 07/09/2015   Procedure: Coronary Stent Intervention;  Surgeon: Kathleene Hazelhristopher D McAlhany, MD;  Location: [facility] INVAS[facility] CV LAB;  Service: Cardiovascular;  Laterality: N/A;  Distal RCA  . SHOULDER ARTHROSCOPY W/ ROTATOR CUFF REPAIR Left 2011  . TRACHEOSTOMY  1960  . TRACHEOSTOMY CLOSURE  1960       Home Medications    Prior to Admission medications   Medication Sig Start Date End Date Taking? Authorizing Provider  acetaminophen (TYLENOL) 325 MG tablet Take 325-650 mg by mouth at bedtime as needed (for shoulder pain).    Yes Historical Provider, MD  aspirin 81 MG chewable tablet Chew 1 tablet (81 mg total) by mouth daily. 07/10/15  Yes Bhavinkumar Bhagat, PA  atorvastatin (LIPITOR) 80 MG tablet Take 1 tablet (80 mg total) by mouth daily at 6 PM. 02/16/16  Yes Jake BatheMark C Skains, MD  carvedilol (COREG) 6.25 MG tablet TAKE 1 TABLET BY MOUTH TWICE DAILY WITH A MEAL 06/29/16  Yes Bhavinkumar Bhagat, PA  lisinopril (PRINIVIL,ZESTRIL) 10 MG tablet Take 1 tablet (10 mg total) by mouth daily. 02/09/16  Yes Mark C  Anne Fu, MD  nitroGLYCERIN (NITROSTAT) 0.4 MG SL tablet Place 1 tablet (0.4 mg total) under the tongue every 5 (five) minutes as needed for chest pain. 02/09/16  Yes Jake Bathe, MD  pantoprazole (PROTONIX) 40 MG tablet TAKE 1 TABLET BY MOUTH EVERY DAY 06/29/16  Yes Bhavinkumar Bhagat, PA  ZENPEP 40000 units CPEP TAKE 2 CAPSULES BY MOUTH WITH EACH MEAL AND 1 CAPSULE WITH [facility] Patient not taking: Reported on 09/19/2016 07/14/16   Unk Lightning, PA    Family  History Family History  Problem Relation Age of Onset  . Lung cancer Mother   . Lung cancer Father   . Colon cancer Neg Hx     Social History Social History  Substance Use Topics  . Smoking status: Never Smoker  . Smokeless tobacco: Never Used  . Alcohol use 0.0 oz/week     Comment: 05/12/2016 "might have a few drinks/month"     Allergies   Zenpep [pancrelipase (lip-prot-amyl)]   Review of Systems Review of Systems  Gastrointestinal: Positive for abdominal pain.     Physical Exam Updated Vital Signs BP 159/86   Pulse 83   Temp 98.7 F (37.1 C)   Resp 20   Ht 5\' 6"  (1.676 m)   Wt 79.4 kg   SpO2 95%   BMI 28.25 kg/m   Physical Exam  Constitutional: He is oriented to person, place, and time. He appears well-developed and well-nourished.  HENT:  Head: Normocephalic.  Neck: Normal range of motion. Neck supple.  Cardiovascular: Normal rate and regular rhythm.   No murmur heard. Pulmonary/Chest: Effort normal and breath sounds normal. He has no wheezes. He has no rales.  Abdominal: Soft. Bowel sounds are normal. There is tenderness (LUQ TTP with guarding). There is no rebound and no guarding.  Musculoskeletal: Normal range of motion.  Neurological: He is alert and oriented to person, place, and time.  Skin: Skin is warm and dry. No rash noted.  Psychiatric: He has a normal mood and affect.     ED Treatments / Results  Labs (all labs ordered are listed, but only abnormal results are displayed) Labs Reviewed  CBC WITH DIFFERENTIAL/PLATELET - Abnormal; Notable for the following:       Result Value   WBC 13.4 (*)    Neutro Abs 10.8 (*)    All other components within normal limits  COMPREHENSIVE METABOLIC PANEL - Abnormal; Notable for the following:    Chloride 98 (*)    Glucose, Bld 106 (*)    Creatinine, Ser 1.34 (*)    GFR calc non Af Amer 55 (*)    All other components within normal limits  LIPASE, BLOOD  URINALYSIS, ROUTINE W REFLEX MICROSCOPIC    Results for orders placed or performed during the hospital encounter of 09/19/16  CBC with Differential  Result Value Ref Range   WBC 13.4 (H) 4.0 - 10.5 K/uL   RBC 4.91 4.22 - 5.81 MIL/uL   Hemoglobin 14.0 13.0 - 17.0 g/dL   HCT 91.4 78.2 - 95.6 %   MCV 83.1 78.0 - 100.0 fL   MCH 28.5 26.0 - 34.0 pg   MCHC 34.3 30.0 - 36.0 g/dL   RDW 21.3 08.6 - 57.8 %   Platelets 236 150 - 400 K/uL   Neutrophils Relative % 81 %   Neutro Abs 10.8 (H) 1.7 - 7.7 K/uL   Lymphocytes Relative 9 %   Lymphs Abs 1.2 0.7 - 4.0 K/uL   Monocytes Relative 7 %  Monocytes Absolute 0.9 0.1 - 1.0 K/uL   Eosinophils Relative 3 %   Eosinophils Absolute 0.4 0.0 - 0.7 K/uL   Basophils Relative 0 %   Basophils Absolute 0.0 0.0 - 0.1 K/uL  Comprehensive metabolic panel  Result Value Ref Range   Sodium 136 135 - 145 mmol/L   Potassium 3.8 3.5 - 5.1 mmol/L   Chloride 98 (L) 101 - 111 mmol/L   CO2 27 22 - 32 mmol/L   Glucose, Bld 106 (H) 65 - 99 mg/dL   BUN 16 6 - 20 mg/dL   Creatinine, Ser 4.09 (H) 0.61 - 1.24 mg/dL   Calcium 9.7 8.9 - 81.1 mg/dL   Total Protein 7.4 6.5 - 8.1 g/dL   Albumin 4.3 3.5 - 5.0 g/dL   AST 24 15 - 41 U/L   ALT 30 17 - 63 U/L   Alkaline Phosphatase 52 38 - 126 U/L   Total Bilirubin 1.0 0.3 - 1.2 mg/dL   GFR calc non Af Amer 55 (L) >60 mL/min   GFR calc Af Amer >60 >60 mL/min   Anion gap 11 5 - 15  Lipase, blood  Result Value Ref Range   Lipase 33 11 - 51 U/L    EKG  EKG Interpretation None       Radiology No results found.  Procedures Procedures (including critical care time)  Medications Ordered in ED Medications  promethazine (PHENERGAN) injection 12.5 mg (not administered)  HYDROmorphone (DILAUDID) injection 1 mg (not administered)  ondansetron (ZOFRAN-ODT) disintegrating tablet 4 mg (4 mg Oral Given 09/19/16 2046)     Initial Impression / Assessment and Plan / ED Course  I have reviewed the triage vital signs and the nursing notes.  Pertinent labs &  imaging results that were available during my care of the patient were reviewed by me and considered in my medical decision making (see chart for details).     Patient with history of pancreatitis presents with characteristic pain starting earlier today. N, V, no fever. He has a mild leukocytosis, a normal lipase, however, CT evidence of pancreatitic inflammation c/w pancreatitis.  Pain is controlled in the ED. He is tolerating PO fluids without recurrent nausea and vomiting. Discussed discharge home vs admission. Given he is pain free and tolerating clear liquids, feel he can trial discharge to home with close PCP evaluation in the am. Discussed strict return precautions. Wife initially expressed concerns about home disposition, but with tolerance of PO's she is now comfortable with going home.   Final Clinical Impressions(s) / ED Diagnoses   Final diagnoses:  None   1. Acute on chronic pancreatitis  New Prescriptions New Prescriptions   No medications on file     Elpidio Anis, Cordelia Poche 09/20/16 0124    Azalia Bilis, MD 09/20/16 [phone]

## 2016-09-20 MED ORDER — ONDANSETRON 8 MG PO TBDP: 8.0000 mg | ORAL_TABLET | Freq: Three times a day (TID) | ORAL | 0 refills | Status: DC | PRN

## 2016-09-20 MED ORDER — OXYCODONE-ACETAMINOPHEN 5-325 MG PO TABS: 1.0000 | ORAL_TABLET | ORAL | 0 refills | Status: DC | PRN

## 2016-09-26 ENCOUNTER — Encounter: Payer: Self-pay | Admitting: Internal Medicine

## 2016-09-26 ENCOUNTER — Other Ambulatory Visit (INDEPENDENT_AMBULATORY_CARE_PROVIDER_SITE_OTHER): Payer: Medicare Other

## 2016-09-26 ENCOUNTER — Other Ambulatory Visit: Payer: Self-pay | Admitting: Internal Medicine

## 2016-09-26 ENCOUNTER — Ambulatory Visit (INDEPENDENT_AMBULATORY_CARE_PROVIDER_SITE_OTHER): Payer: Medicare Other | Admitting: Internal Medicine

## 2016-09-26 VITALS — BP 164/90 | HR 70 | Ht 66.0 in | Wt 177.0 lb

## 2016-09-26 DIAGNOSIS — K859 Acute pancreatitis without necrosis or infection, unspecified: Secondary | ICD-10-CM

## 2016-09-26 DIAGNOSIS — K861 Other chronic pancreatitis: Secondary | ICD-10-CM

## 2016-09-26 DIAGNOSIS — I10 Essential (primary) hypertension: Secondary | ICD-10-CM | POA: Diagnosis not present

## 2016-09-26 DIAGNOSIS — IMO0002 Reserved for concepts with insufficient information to code with codable children: Secondary | ICD-10-CM

## 2016-09-26 DIAGNOSIS — Z77018 Contact with and (suspected) exposure to other hazardous metals: Secondary | ICD-10-CM

## 2016-09-26 LAB — CREATININE, SERUM: CREATININE: 1.06 mg/dL (ref 0.40–1.50)

## 2016-09-26 LAB — BUN: BUN: 11 mg/dL (ref 6–23)

## 2016-09-26 NOTE — Progress Notes (Signed)
Thanks seems like a good plan to me.

## 2016-09-26 NOTE — Progress Notes (Signed)
David Cantu 60 y.o. [birth year] [id number]  Assessment & Plan:   1. Pancreatitis, recurrent (HCC)   2. Elevated blood pressure reading with diagnosis of hypertension     The patient has a history of pancreatitis, at least twice he's been biochemically positive with elevated lipase but more recently he's had symptoms of pancreatitis and imaging characteristics suggestive but don't confirm he has pancreatitis. More recently had pain in the left upper quadrant he was having vomiting, terrible pain, a CT scan showed some minimal tail inflammation suggested but his lipase was normal. He does not drink, he does not have gallstones, does not seem to be mass lesion etc. He is on lisinopril which is associated but his first episode of pancreatitis predates that period. Triglycerides have been normal. He had an unusual situation where a trial of ZenPep on 2 different occasions that led to severe nausea and vomiting. Ultrasound has not shown gallstones other abnormalities that could be related to his problems though it did suspect fatty liver and hepatic cyst. Cystic lesion seen on CT scan as well. Noted 2015 CT scan showed changes in the head of the pancreas suggesting focal pancreatitis.  We'll go ahead and get an MRCP to evaluate for structural abnormalities not appreciated so far. Within without contrast though we'll check a BUN and creatinine does he had a slight elevation in creatinine when he was in the emergency department a few days ago. His blood pressure was rechecked today and was improved.  I appreciate the opportunity to care for this patient. CC: David Mackintosh, MD   Subjective:   Chief Complaint: Recurrent pancreatitis  HPI The patient is here for follow-up, this was scheduled after he was seen in November by David Meeker PA-C, I had recommended an MRCP to try to understand why he might be having recurrent pancreatitis attacks, I then spoke to the patient at his request because  he wanted to defer the study due to cost issues etc. and whether or not he really needed. We were to sit down and discuss this further today. Unfortunately on February 19 he had an episode of acute left upper quadrant pain associated with nausea and vomiting, was seen in the emergency department where lipase was normal, but a CT scan showed some pancreatic tail inflammation that was very subtle. I reviewed the images and discussed this with radiology as well. He is better at this time. He responded to sublingual Zofran and pain medication that day and was not admitted. Estimated because he says he doing everything right no alcohol, his diet is good, and he still has problems.  He was started on ZenPep when he was here before as well in both times he has tried if he is had nausea and vomiting so he has not tolerated that  Medications, allergies, past medical history, past surgical history, family history and social history are reviewed and updated in the EMR.   Review of Systems As above  Objective:   Physical Exam BP (!) 164/90   Pulse 70   Ht 5\' 6"  (1.676 m)   Wt 177 lb (80.3 kg)   BMI 28.57 kg/m   First blood pressure reading was 180/90 today No acute distress Eyes are anicteric Abdomen is soft nontender no organomegaly or mass Appropriate mood and affect He is a alert and oriented 3   I reviewed imaging as mentioned above, emergency room visit notes. Labs as described above. Previous hospitalization records. Note that twice he's had elevated lipases  First was in July 2015 lipase was 102 which was twice abnormal Lipase was 312 4 months ago when he was admitted  Liver function tests have been normal    25 minutes time spent with patient > half in counseling coordination of care

## 2016-09-26 NOTE — Patient Instructions (Addendum)
   Your physician has requested that you go to the basement for the following lab work before leaving today: BUN, creatinine    [facility] [address] on 10/01/16. Your appointment time is 2:10pm. Please arrive 15 minutes prior to your appointment time for registration purposes. Please make certain not to have anything to eat or drink 4 hours prior to your test. In addition, if you have any metal in your body, have a pacemaker or defibrillator, please be sure to let your ordering physician know. This test typically takes  1 hour to complete. There phone # is [phone].   Please go by there between 8:00AM-4:30pm to have orbit x-rays done prior to your Saturday appointment.  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 Appointment.    I appreciate the opportunity to care for you. Stan Headarl Gessner, MD, [facility]

## 2016-09-29 ENCOUNTER — Ambulatory Visit
Admission: RE | Admit: 2016-09-29 | Discharge: 2016-09-29 | Disposition: A | Discharge status: Home / Self Care | Payer: Medicare Other | Source: Ambulatory Visit | Attending: Internal Medicine | Admitting: Internal Medicine

## 2016-09-29 DIAGNOSIS — Z77018 Contact with and (suspected) exposure to other hazardous metals: Secondary | ICD-10-CM

## 2016-09-29 DIAGNOSIS — Z01818 Encounter for other preprocedural examination: Secondary | ICD-10-CM | POA: Diagnosis not present

## 2016-10-01 ENCOUNTER — Ambulatory Visit
Admission: RE | Admit: 2016-10-01 | Discharge: 2016-10-01 | Disposition: A | Discharge status: Home / Self Care | Payer: Medicare Other | Source: Ambulatory Visit | Attending: Internal Medicine | Admitting: Internal Medicine

## 2016-10-01 DIAGNOSIS — K859 Acute pancreatitis without necrosis or infection, unspecified: Secondary | ICD-10-CM

## 2016-10-01 DIAGNOSIS — R935 Abnormal findings on diagnostic imaging of other abdominal regions, including retroperitoneum: Secondary | ICD-10-CM | POA: Diagnosis not present

## 2016-10-01 MED ORDER — GADOBENATE DIMEGLUMINE 529 MG/ML IV SOLN
16.0000 mL | Freq: Once | INTRAVENOUS | Status: AC | PRN
  Administered 2016-10-01: 16 mL via INTRAVENOUS

## 2016-10-05 ENCOUNTER — Telehealth: Payer: Self-pay | Admitting: Internal Medicine

## 2016-10-05 NOTE — Telephone Encounter (Signed)
Patient is notified that there are no acute findings on the MRI, but it still needs reviewed by Dr. Leone PayorGessner when he returns to the office.  He is advised that I will call with results when Dr. Leone PayorGessner can review.  He is asking what the next step is, why does he continue to get pancreatitis and what can he do to prevent another episode?

## 2016-10-06 MED ORDER — OXYCODONE-ACETAMINOPHEN 5-325 MG PO TABS: 1.0000 | ORAL_TABLET | ORAL | 0 refills | Status: DC | PRN

## 2016-10-06 NOTE — Telephone Encounter (Signed)
rx printed, signed and placed out front

## 2016-10-06 NOTE — Telephone Encounter (Signed)
Reviewed his situation  He has had pancreatitis in past though some of his pain episodes have not shown clear evidence biochemically but suggested on imaging   He needs:  1) Percocet rx # 15 to have at home in case another attack - 1 q 4 no RF - I pended it  2) Recall MRI/MRCP 1 year due to tiny cyst 3) Call back - see me prn - if has sxs is to try to treat at home (has anti-emetic) and call us

## 2016-10-06 NOTE — Progress Notes (Signed)
See phone note Plan MR/MRCP in 1 year see me sooner prn

## 2016-10-18 ENCOUNTER — Other Ambulatory Visit: Payer: Medicare Other | Admitting: Internal Medicine

## 2016-10-20 ENCOUNTER — Ambulatory Visit: Payer: Medicare Other | Admitting: Internal Medicine

## 2016-12-30 ENCOUNTER — Encounter: Payer: Self-pay | Admitting: Internal Medicine

## 2016-12-30 ENCOUNTER — Ambulatory Visit (INDEPENDENT_AMBULATORY_CARE_PROVIDER_SITE_OTHER): Payer: Medicare Other | Admitting: Internal Medicine

## 2016-12-30 VITALS — BP 138/80 | HR 64 | Temp 97.8°F | Wt 175.0 lb

## 2016-12-30 DIAGNOSIS — H6692 Otitis media, unspecified, left ear: Secondary | ICD-10-CM

## 2016-12-30 DIAGNOSIS — J069 Acute upper respiratory infection, unspecified: Secondary | ICD-10-CM | POA: Diagnosis not present

## 2016-12-30 DIAGNOSIS — H1033 Unspecified acute conjunctivitis, bilateral: Secondary | ICD-10-CM | POA: Diagnosis not present

## 2016-12-30 MED ORDER — OFLOXACIN 0.3 % OP SOLN: OPHTHALMIC | 0 refills | Status: DC

## 2016-12-30 MED ORDER — AMOXICILLIN 500 MG PO CAPS: 500.0000 mg | ORAL_CAPSULE | Freq: Three times a day (TID) | ORAL | 0 refills | Status: DC

## 2016-12-30 MED ORDER — BENZONATATE 100 MG PO CAPS: 100.0000 mg | ORAL_CAPSULE | Freq: Three times a day (TID) | ORAL | 0 refills | Status: DC | PRN

## 2016-12-30 NOTE — Patient Instructions (Addendum)
Amoxicillin 500 mg 3 times daily for 10 days. Tessalon Perles 100 mg 3 times daily. Rest and drink plenty of fluids. Call if not better in 10 days or sooner if worse. Ocuflox ophthalmic solution 2 drops in each eye 4 times a day for 5 days

## 2016-12-30 NOTE — Progress Notes (Signed)
   Subjective:    Patient ID: David Cantu, male    DOB: 09/19/1955, 61 y.o.   MRN: [id number]  HPI Onset of sore throat Sunday. Has had cough, headache and crustiness around eyes. No documented fever or chills. Malaise and fatigue. No vomiting. Decreased appetite. Some recent airplane travel. No myalgias. Cough is slightly productive. Took NyQuil last evening which may not be a good choice in view of his history of pancreatitis as it contains alcohol. Has left ear pain.  History of recurrent pancreatitis evaluated extensively by GI        Review of SystemsSee above     Objective:   Physical Exam Pharynx very slightly injected. Left TM is red. Right TM is slightly full but not red. Neck is supple without adenopathy. Chest clear to auscultation without rales or wheezing. Skin is warm and dry. He has erythema of conjunctivae bilaterally.       Assessment & Plan:  Acute left otitis media  Acute URI  Acute bilateral conjunctivitis  Plan: Amoxicillin 500 mg 3 times daily for 10 days Tessalon Perles 100 mg 3 times daily as needed for cough. Rest and drink plenty of fluids. Call if not better in 10 days or sooner if worse. Ocuflox ophthalmic solution 2 drops in each eye 4 times a day for 5 days..Marland Kitchen

## 2017-01-24 ENCOUNTER — Other Ambulatory Visit: Payer: Self-pay | Admitting: Cardiology

## 2017-02-26 ENCOUNTER — Other Ambulatory Visit: Payer: Self-pay | Admitting: Physician Assistant

## 2017-02-26 ENCOUNTER — Other Ambulatory Visit: Payer: Self-pay | Admitting: Cardiology

## 2017-03-22 DIAGNOSIS — H5213 Myopia, bilateral: Secondary | ICD-10-CM | POA: Diagnosis not present

## 2017-04-20 ENCOUNTER — Ambulatory Visit (INDEPENDENT_AMBULATORY_CARE_PROVIDER_SITE_OTHER): Payer: Medicare Other | Admitting: Internal Medicine

## 2017-04-20 ENCOUNTER — Encounter: Payer: Self-pay | Admitting: Internal Medicine

## 2017-04-20 VITALS — BP 160/80 | HR 65 | Temp 97.8°F

## 2017-04-20 DIAGNOSIS — Z23 Encounter for immunization: Secondary | ICD-10-CM | POA: Diagnosis not present

## 2017-04-20 NOTE — Patient Instructions (Signed)
Tdap and flu vaccines given

## 2017-04-20 NOTE — Progress Notes (Signed)
Tdap given today in the Left arm. Flu given today in the right arm. Pt tolerated well. Discussed blood pressure and needing a visit for that and pt declined. Stated has a CPE in November and will discuss it then unless his entire CPE can be moved earlier. Placed him on the cancellation list.

## 2017-04-20 NOTE — Progress Notes (Signed)
Tdap and flu vaccines given 

## 2017-04-24 DIAGNOSIS — M75111 Incomplete rotator cuff tear or rupture of right shoulder, not specified as traumatic: Secondary | ICD-10-CM | POA: Diagnosis not present

## 2017-05-09 DIAGNOSIS — M75111 Incomplete rotator cuff tear or rupture of right shoulder, not specified as traumatic: Secondary | ICD-10-CM | POA: Diagnosis not present

## 2017-05-09 DIAGNOSIS — G8918 Other acute postprocedural pain: Secondary | ICD-10-CM | POA: Diagnosis not present

## 2017-05-09 DIAGNOSIS — M7541 Impingement syndrome of right shoulder: Secondary | ICD-10-CM | POA: Diagnosis not present

## 2017-05-09 DIAGNOSIS — M75121 Complete rotator cuff tear or rupture of right shoulder, not specified as traumatic: Secondary | ICD-10-CM | POA: Diagnosis not present

## 2017-05-09 DIAGNOSIS — S43431A Superior glenoid labrum lesion of right shoulder, initial encounter: Secondary | ICD-10-CM | POA: Diagnosis not present

## 2017-05-09 DIAGNOSIS — M19011 Primary osteoarthritis, right shoulder: Secondary | ICD-10-CM | POA: Diagnosis not present

## 2017-06-20 ENCOUNTER — Other Ambulatory Visit: Payer: Medicare Other | Admitting: Internal Medicine

## 2017-06-21 DIAGNOSIS — M25611 Stiffness of right shoulder, not elsewhere classified: Secondary | ICD-10-CM | POA: Diagnosis not present

## 2017-06-21 DIAGNOSIS — M25511 Pain in right shoulder: Secondary | ICD-10-CM | POA: Diagnosis not present

## 2017-06-21 DIAGNOSIS — Z4789 Encounter for other orthopedic aftercare: Secondary | ICD-10-CM | POA: Diagnosis not present

## 2017-06-21 DIAGNOSIS — M75101 Unspecified rotator cuff tear or rupture of right shoulder, not specified as traumatic: Secondary | ICD-10-CM | POA: Diagnosis not present

## 2017-06-26 ENCOUNTER — Other Ambulatory Visit (INDEPENDENT_AMBULATORY_CARE_PROVIDER_SITE_OTHER): Payer: Medicare Other | Admitting: Internal Medicine

## 2017-06-26 DIAGNOSIS — M75101 Unspecified rotator cuff tear or rupture of right shoulder, not specified as traumatic: Secondary | ICD-10-CM | POA: Diagnosis not present

## 2017-06-26 DIAGNOSIS — E871 Hypo-osmolality and hyponatremia: Secondary | ICD-10-CM | POA: Diagnosis not present

## 2017-06-26 DIAGNOSIS — I10 Essential (primary) hypertension: Secondary | ICD-10-CM | POA: Diagnosis not present

## 2017-06-26 DIAGNOSIS — Z125 Encounter for screening for malignant neoplasm of prostate: Secondary | ICD-10-CM

## 2017-06-26 DIAGNOSIS — E781 Pure hyperglyceridemia: Secondary | ICD-10-CM

## 2017-06-26 DIAGNOSIS — Z0000 Encounter for general adult medical examination without abnormal findings: Secondary | ICD-10-CM

## 2017-06-26 DIAGNOSIS — M25511 Pain in right shoulder: Secondary | ICD-10-CM | POA: Diagnosis not present

## 2017-06-26 DIAGNOSIS — Z4789 Encounter for other orthopedic aftercare: Secondary | ICD-10-CM | POA: Diagnosis not present

## 2017-06-26 DIAGNOSIS — M25611 Stiffness of right shoulder, not elsewhere classified: Secondary | ICD-10-CM | POA: Diagnosis not present

## 2017-06-27 ENCOUNTER — Other Ambulatory Visit: Payer: Self-pay | Admitting: Internal Medicine

## 2017-06-27 ENCOUNTER — Encounter: Payer: Self-pay | Admitting: Internal Medicine

## 2017-06-27 ENCOUNTER — Ambulatory Visit (INDEPENDENT_AMBULATORY_CARE_PROVIDER_SITE_OTHER): Payer: Medicare Other | Admitting: Internal Medicine

## 2017-06-27 VITALS — BP 144/90 | HR 70 | Temp 98.0°F | Ht 64.5 in | Wt 186.0 lb

## 2017-06-27 DIAGNOSIS — K861 Other chronic pancreatitis: Secondary | ICD-10-CM

## 2017-06-27 DIAGNOSIS — K859 Acute pancreatitis without necrosis or infection, unspecified: Secondary | ICD-10-CM

## 2017-06-27 DIAGNOSIS — Z0000 Encounter for general adult medical examination without abnormal findings: Secondary | ICD-10-CM

## 2017-06-27 DIAGNOSIS — E781 Pure hyperglyceridemia: Secondary | ICD-10-CM

## 2017-06-27 DIAGNOSIS — I10 Essential (primary) hypertension: Secondary | ICD-10-CM | POA: Diagnosis not present

## 2017-06-27 DIAGNOSIS — Z955 Presence of coronary angioplasty implant and graft: Secondary | ICD-10-CM

## 2017-06-27 DIAGNOSIS — R739 Hyperglycemia, unspecified: Secondary | ICD-10-CM

## 2017-06-27 DIAGNOSIS — E785 Hyperlipidemia, unspecified: Secondary | ICD-10-CM

## 2017-06-27 DIAGNOSIS — Z79899 Other long term (current) drug therapy: Secondary | ICD-10-CM

## 2017-06-27 LAB — LIPID PANEL
CHOL/HDL RATIO: 4.2 (calc) (ref <5.0)
CHOLESTEROL: 152 mg/dL (ref <200)
HDL: 36 mg/dL — AB (ref >40)
LDL Cholesterol (Calc): 84 mg/dL (calc)
NON-HDL CHOLESTEROL (CALC): 116 mg/dL (ref <130)
TRIGLYCERIDES: 229 mg/dL — AB (ref <150)

## 2017-06-27 LAB — COMPLETE METABOLIC PANEL WITH GFR
AG Ratio: 1.6 (calc) (ref 1.0–2.5)
ALKALINE PHOSPHATASE (APISO): 61 U/L (ref 40–115)
ALT: 27 U/L (ref 9–46)
AST: 19 U/L (ref 10–35)
Albumin: 4.2 g/dL (ref 3.6–5.1)
BUN: 20 mg/dL (ref 7–25)
CALCIUM: 9.3 mg/dL (ref 8.6–10.3)
CO2: 28 mmol/L (ref 20–32)
CREATININE: 1.06 mg/dL (ref 0.70–1.25)
Chloride: 103 mmol/L (ref 98–110)
GFR, EST NON AFRICAN AMERICAN: 74 mL/min/{1.73_m2} (ref >=60)
GFR, Est African American: 86 mL/min/{1.73_m2} (ref >=60)
GLUCOSE: 116 mg/dL — AB (ref 65–99)
Globulin: 2.7 g/dL (calc) (ref 1.9–3.7)
POTASSIUM: 4.3 mmol/L (ref 3.5–5.3)
Sodium: 139 mmol/L (ref 135–146)
Total Bilirubin: 0.5 mg/dL (ref 0.2–1.2)
Total Protein: 6.9 g/dL (ref 6.1–8.1)

## 2017-06-27 LAB — CBC WITH DIFFERENTIAL/PLATELET
Basophils Absolute: 51 cells/uL (ref 0–200)
Basophils Relative: 0.7 %
EOS PCT: 6.2 %
Eosinophils Absolute: 453 cells/uL (ref 15–500)
HCT: 42 % (ref 38.5–50.0)
Hemoglobin: 14 g/dL (ref 13.2–17.1)
LYMPHS ABS: 1183 {cells}/uL (ref 850–3900)
MCH: 27.8 pg (ref 27.0–33.0)
MCHC: 33.3 g/dL (ref 32.0–36.0)
MCV: 83.3 fL (ref 80.0–100.0)
MONOS PCT: 7.1 %
MPV: 10.7 fL (ref 7.5–12.5)
NEUTROS PCT: 69.8 %
Neutro Abs: 5095 cells/uL (ref 1500–7800)
PLATELETS: 256 10*3/uL (ref 140–400)
RBC: 5.04 10*6/uL (ref 4.20–5.80)
RDW: 12.5 % (ref 11.0–15.0)
TOTAL LYMPHOCYTE: 16.2 %
WBC mixed population: 518 cells/uL (ref 200–950)
WBC: 7.3 10*3/uL (ref 3.8–10.8)

## 2017-06-27 LAB — POCT URINALYSIS DIPSTICK
BILIRUBIN UA: NEGATIVE
GLUCOSE UA: NEGATIVE
Ketones, UA: NEGATIVE
Leukocytes, UA: NEGATIVE
Nitrite, UA: NEGATIVE
Protein, UA: NEGATIVE
RBC UA: NEGATIVE
SPEC GRAV UA: 1.02 (ref 1.010–1.025)
UROBILINOGEN UA: 0.2 U/dL
pH, UA: 6.5 (ref 5.0–8.0)

## 2017-06-27 LAB — PSA: PSA: 1 ng/mL (ref <=4.0)
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
## 2017-06-28 DIAGNOSIS — M25611 Stiffness of right shoulder, not elsewhere classified: Secondary | ICD-10-CM | POA: Diagnosis not present

## 2017-06-28 DIAGNOSIS — M25511 Pain in right shoulder: Secondary | ICD-10-CM | POA: Diagnosis not present

## 2017-06-28 DIAGNOSIS — Z4789 Encounter for other orthopedic aftercare: Secondary | ICD-10-CM | POA: Diagnosis not present

## 2017-06-28 DIAGNOSIS — M75101 Unspecified rotator cuff tear or rupture of right shoulder, not specified as traumatic: Secondary | ICD-10-CM | POA: Diagnosis not present

## 2017-06-28 NOTE — Patient Instructions (Addendum)
It was a pleasure to see you today.  Continue follow-up with Drs. Leone PayorGessner and [place].  Return here in February for follow-up of lipids

## 2017-06-30 DIAGNOSIS — M25611 Stiffness of right shoulder, not elsewhere classified: Secondary | ICD-10-CM | POA: Diagnosis not present

## 2017-06-30 DIAGNOSIS — M25511 Pain in right shoulder: Secondary | ICD-10-CM | POA: Diagnosis not present

## 2017-06-30 DIAGNOSIS — Z4789 Encounter for other orthopedic aftercare: Secondary | ICD-10-CM | POA: Diagnosis not present

## 2017-06-30 DIAGNOSIS — M75101 Unspecified rotator cuff tear or rupture of right shoulder, not specified as traumatic: Secondary | ICD-10-CM | POA: Diagnosis not present

## 2017-07-03 DIAGNOSIS — M25611 Stiffness of right shoulder, not elsewhere classified: Secondary | ICD-10-CM | POA: Diagnosis not present

## 2017-07-03 DIAGNOSIS — M25511 Pain in right shoulder: Secondary | ICD-10-CM | POA: Diagnosis not present

## 2017-07-03 DIAGNOSIS — Z4789 Encounter for other orthopedic aftercare: Secondary | ICD-10-CM | POA: Diagnosis not present

## 2017-07-03 DIAGNOSIS — M75101 Unspecified rotator cuff tear or rupture of right shoulder, not specified as traumatic: Secondary | ICD-10-CM | POA: Diagnosis not present

## 2017-07-05 DIAGNOSIS — M75101 Unspecified rotator cuff tear or rupture of right shoulder, not specified as traumatic: Secondary | ICD-10-CM | POA: Diagnosis not present

## 2017-07-05 DIAGNOSIS — Z4789 Encounter for other orthopedic aftercare: Secondary | ICD-10-CM | POA: Diagnosis not present

## 2017-07-05 DIAGNOSIS — M25611 Stiffness of right shoulder, not elsewhere classified: Secondary | ICD-10-CM | POA: Diagnosis not present

## 2017-07-05 DIAGNOSIS — M25511 Pain in right shoulder: Secondary | ICD-10-CM | POA: Diagnosis not present

## 2017-07-07 DIAGNOSIS — M25511 Pain in right shoulder: Secondary | ICD-10-CM | POA: Diagnosis not present

## 2017-07-07 DIAGNOSIS — Z4789 Encounter for other orthopedic aftercare: Secondary | ICD-10-CM | POA: Diagnosis not present

## 2017-07-07 DIAGNOSIS — M75101 Unspecified rotator cuff tear or rupture of right shoulder, not specified as traumatic: Secondary | ICD-10-CM | POA: Diagnosis not present

## 2017-07-07 DIAGNOSIS — M25611 Stiffness of right shoulder, not elsewhere classified: Secondary | ICD-10-CM | POA: Diagnosis not present

## 2017-07-11 DIAGNOSIS — M25511 Pain in right shoulder: Secondary | ICD-10-CM | POA: Diagnosis not present

## 2017-07-11 DIAGNOSIS — M75101 Unspecified rotator cuff tear or rupture of right shoulder, not specified as traumatic: Secondary | ICD-10-CM | POA: Diagnosis not present

## 2017-07-11 DIAGNOSIS — Z4789 Encounter for other orthopedic aftercare: Secondary | ICD-10-CM | POA: Diagnosis not present

## 2017-07-11 DIAGNOSIS — M25611 Stiffness of right shoulder, not elsewhere classified: Secondary | ICD-10-CM | POA: Diagnosis not present

## 2017-07-12 DIAGNOSIS — M75101 Unspecified rotator cuff tear or rupture of right shoulder, not specified as traumatic: Secondary | ICD-10-CM | POA: Diagnosis not present

## 2017-07-12 DIAGNOSIS — M25511 Pain in right shoulder: Secondary | ICD-10-CM | POA: Diagnosis not present

## 2017-07-12 DIAGNOSIS — Z4789 Encounter for other orthopedic aftercare: Secondary | ICD-10-CM | POA: Diagnosis not present

## 2017-07-12 DIAGNOSIS — M25611 Stiffness of right shoulder, not elsewhere classified: Secondary | ICD-10-CM | POA: Diagnosis not present

## 2017-07-14 DIAGNOSIS — M25511 Pain in right shoulder: Secondary | ICD-10-CM | POA: Diagnosis not present

## 2017-07-14 DIAGNOSIS — M75101 Unspecified rotator cuff tear or rupture of right shoulder, not specified as traumatic: Secondary | ICD-10-CM | POA: Diagnosis not present

## 2017-07-14 DIAGNOSIS — Z4789 Encounter for other orthopedic aftercare: Secondary | ICD-10-CM | POA: Diagnosis not present

## 2017-07-14 DIAGNOSIS — M25611 Stiffness of right shoulder, not elsewhere classified: Secondary | ICD-10-CM | POA: Diagnosis not present

## 2017-07-17 DIAGNOSIS — M75101 Unspecified rotator cuff tear or rupture of right shoulder, not specified as traumatic: Secondary | ICD-10-CM | POA: Diagnosis not present

## 2017-07-17 DIAGNOSIS — M25611 Stiffness of right shoulder, not elsewhere classified: Secondary | ICD-10-CM | POA: Diagnosis not present

## 2017-07-17 DIAGNOSIS — Z4789 Encounter for other orthopedic aftercare: Secondary | ICD-10-CM | POA: Diagnosis not present

## 2017-07-17 DIAGNOSIS — M25511 Pain in right shoulder: Secondary | ICD-10-CM | POA: Diagnosis not present

## 2017-07-20 DIAGNOSIS — Z4789 Encounter for other orthopedic aftercare: Secondary | ICD-10-CM | POA: Diagnosis not present

## 2017-07-20 DIAGNOSIS — M25511 Pain in right shoulder: Secondary | ICD-10-CM | POA: Diagnosis not present

## 2017-07-20 DIAGNOSIS — M25611 Stiffness of right shoulder, not elsewhere classified: Secondary | ICD-10-CM | POA: Diagnosis not present

## 2017-07-20 DIAGNOSIS — M75101 Unspecified rotator cuff tear or rupture of right shoulder, not specified as traumatic: Secondary | ICD-10-CM | POA: Diagnosis not present

## 2017-07-26 DIAGNOSIS — M75101 Unspecified rotator cuff tear or rupture of right shoulder, not specified as traumatic: Secondary | ICD-10-CM | POA: Diagnosis not present

## 2017-07-26 DIAGNOSIS — Z4789 Encounter for other orthopedic aftercare: Secondary | ICD-10-CM | POA: Diagnosis not present

## 2017-07-26 DIAGNOSIS — M25511 Pain in right shoulder: Secondary | ICD-10-CM | POA: Diagnosis not present

## 2017-07-26 DIAGNOSIS — M25611 Stiffness of right shoulder, not elsewhere classified: Secondary | ICD-10-CM | POA: Diagnosis not present

## 2017-07-28 DIAGNOSIS — M25511 Pain in right shoulder: Secondary | ICD-10-CM | POA: Diagnosis not present

## 2017-07-28 DIAGNOSIS — M25611 Stiffness of right shoulder, not elsewhere classified: Secondary | ICD-10-CM | POA: Diagnosis not present

## 2017-07-28 DIAGNOSIS — M75101 Unspecified rotator cuff tear or rupture of right shoulder, not specified as traumatic: Secondary | ICD-10-CM | POA: Diagnosis not present

## 2017-07-28 DIAGNOSIS — Z4789 Encounter for other orthopedic aftercare: Secondary | ICD-10-CM | POA: Diagnosis not present

## 2017-07-31 DIAGNOSIS — M25611 Stiffness of right shoulder, not elsewhere classified: Secondary | ICD-10-CM | POA: Diagnosis not present

## 2017-07-31 DIAGNOSIS — M75101 Unspecified rotator cuff tear or rupture of right shoulder, not specified as traumatic: Secondary | ICD-10-CM | POA: Diagnosis not present

## 2017-07-31 DIAGNOSIS — Z4789 Encounter for other orthopedic aftercare: Secondary | ICD-10-CM | POA: Diagnosis not present

## 2017-07-31 DIAGNOSIS — M25511 Pain in right shoulder: Secondary | ICD-10-CM | POA: Diagnosis not present

## 2017-08-02 DIAGNOSIS — M75101 Unspecified rotator cuff tear or rupture of right shoulder, not specified as traumatic: Secondary | ICD-10-CM | POA: Diagnosis not present

## 2017-08-02 DIAGNOSIS — Z4789 Encounter for other orthopedic aftercare: Secondary | ICD-10-CM | POA: Diagnosis not present

## 2017-08-02 DIAGNOSIS — M25511 Pain in right shoulder: Secondary | ICD-10-CM | POA: Diagnosis not present

## 2017-08-02 DIAGNOSIS — M25611 Stiffness of right shoulder, not elsewhere classified: Secondary | ICD-10-CM | POA: Diagnosis not present

## 2017-08-04 DIAGNOSIS — M25511 Pain in right shoulder: Secondary | ICD-10-CM | POA: Diagnosis not present

## 2017-08-04 DIAGNOSIS — M25611 Stiffness of right shoulder, not elsewhere classified: Secondary | ICD-10-CM | POA: Diagnosis not present

## 2017-08-04 DIAGNOSIS — M75101 Unspecified rotator cuff tear or rupture of right shoulder, not specified as traumatic: Secondary | ICD-10-CM | POA: Diagnosis not present

## 2017-08-04 DIAGNOSIS — Z4789 Encounter for other orthopedic aftercare: Secondary | ICD-10-CM | POA: Diagnosis not present

## 2017-08-07 DIAGNOSIS — M75101 Unspecified rotator cuff tear or rupture of right shoulder, not specified as traumatic: Secondary | ICD-10-CM | POA: Diagnosis not present

## 2017-08-07 DIAGNOSIS — M25511 Pain in right shoulder: Secondary | ICD-10-CM | POA: Diagnosis not present

## 2017-08-07 DIAGNOSIS — M25611 Stiffness of right shoulder, not elsewhere classified: Secondary | ICD-10-CM | POA: Diagnosis not present

## 2017-08-07 DIAGNOSIS — Z4789 Encounter for other orthopedic aftercare: Secondary | ICD-10-CM | POA: Diagnosis not present

## 2017-08-09 DIAGNOSIS — M75101 Unspecified rotator cuff tear or rupture of right shoulder, not specified as traumatic: Secondary | ICD-10-CM | POA: Diagnosis not present

## 2017-08-09 DIAGNOSIS — Z4789 Encounter for other orthopedic aftercare: Secondary | ICD-10-CM | POA: Diagnosis not present

## 2017-08-09 DIAGNOSIS — M25611 Stiffness of right shoulder, not elsewhere classified: Secondary | ICD-10-CM | POA: Diagnosis not present

## 2017-08-09 DIAGNOSIS — M25511 Pain in right shoulder: Secondary | ICD-10-CM | POA: Diagnosis not present

## 2017-08-10 ENCOUNTER — Ambulatory Visit: Payer: Medicare Other | Admitting: Cardiology

## 2017-08-10 ENCOUNTER — Encounter: Payer: Self-pay | Admitting: Cardiology

## 2017-08-10 VITALS — BP 150/90 | HR 59 | Ht 64.5 in | Wt 180.8 lb

## 2017-08-10 DIAGNOSIS — E7800 Pure hypercholesterolemia, unspecified: Secondary | ICD-10-CM | POA: Diagnosis not present

## 2017-08-10 DIAGNOSIS — Z955 Presence of coronary angioplasty implant and graft: Secondary | ICD-10-CM | POA: Diagnosis not present

## 2017-08-10 DIAGNOSIS — I2510 Atherosclerotic heart disease of native coronary artery without angina pectoris: Secondary | ICD-10-CM

## 2017-08-10 DIAGNOSIS — I10 Essential (primary) hypertension: Secondary | ICD-10-CM | POA: Diagnosis not present

## 2017-08-10 DIAGNOSIS — I2583 Coronary atherosclerosis due to lipid rich plaque: Secondary | ICD-10-CM

## 2017-08-10 NOTE — Patient Instructions (Signed)
Medication Instructions:  The current medical regimen is effective;  continue present plan and medications. Please call if your blood pressure is consistently above 140 systolic.  Follow-Up: Follow up in 1 year with Dr. Anne FuSkains.  You will receive a letter in the mail 2 months before you are due.  Please call us when you receive this letter to schedule your follow up appointment.  If you need a refill on your cardiac medications before your next appointment, please call your pharmacy.  Thank you for choosing [facility]!!     Low-Sodium Eating Plan Sodium, which is an element that makes up salt, helps you maintain a healthy balance of fluids in your body. Too much sodium can increase your blood pressure and cause fluid and waste to be held in your body. Your health care provider or dietitian may recommend following this plan if you have high blood pressure (hypertension), kidney disease, liver disease, or heart failure. Eating less sodium can help lower your blood pressure, reduce swelling, and protect your heart, liver, and kidneys. What are tips for following this plan? General guidelines  Most people on this plan should limit their sodium intake to 1,500-2,000 mg (milligrams) of sodium each day. Reading food labels  The Nutrition Facts label lists the amount of sodium in one serving of the food. If you eat more than one serving, you must multiply the listed amount of sodium by the number of servings.  Choose foods with less than 140 mg of sodium per serving.  Avoid foods with 300 mg of sodium or more per serving. Shopping  Look for lower-sodium products, often labeled as "low-sodium" or "no salt added."  Always check the sodium content even if foods are labeled as "unsalted" or "no salt added".  Buy fresh foods. ? Avoid canned foods and premade or frozen meals. ? Avoid canned, cured, or processed meats  Buy breads that have less than 80 mg of sodium per  slice. Cooking  Eat more home-cooked food and less restaurant, buffet, and fast food.  Avoid adding salt when cooking. Use salt-free seasonings or herbs instead of table salt or sea salt. Check with your health care provider or pharmacist before using salt substitutes.  Cook with plant-based oils, such as canola, sunflower, or olive oil. Meal planning  When eating at a restaurant, ask that your food be prepared with less salt or no salt, if possible.  Avoid foods that contain MSG (monosodium glutamate). MSG is sometimes added to Cong[place]inese food, bouillon, and some canned foods. What foods are recommended? The items listed may not be a complete list. Talk with your dietitian about what dietary choices are best for you. Grains Low-sodium cereals, including oats, puffed wheat and rice, and shredded wheat. Low-sodium crackers. Unsalted rice. Unsalted pasta. Low-sodium bread. Whole-grain breads and whole-grain pasta. Vegetables Fresh or frozen vegetables. "No salt added" canned vegetables. "No salt added" tomato sauce and paste. Low-sodium or reduced-sodium tomato and vegetable juice. Fruits Fresh, frozen, or canned fruit. Fruit juice. Meats and other protein foods Fresh or frozen (no salt added) meat, poultry, seafood, and fish. Low-sodium canned tuna and salmon. Unsalted nuts. Dried peas, beans, and lentils without added salt. Unsalted canned beans. Eggs. Unsalted nut butters. Dairy Milk. Soy milk. Cheese that is naturally low in sodium, such as ricotta cheese, fresh mozzarella, or Swiss cheese Low-sodium or reduced-sodium cheese. Cream cheese. Yogurt. Fats and oils Unsalted butter. Unsalted margarine with no trans fat. Vegetable oils such as canola or olive oils.  Seasonings and other foods Fresh and dried herbs and spices. Salt-free seasonings. Low-sodium mustard and ketchup. Sodium-free salad dressing. Sodium-free light mayonnaise. Fresh or refrigerated horseradish. Lemon juice. Vinegar.  Homemade, reduced-sodium, or low-sodium soups. Unsalted popcorn and pretzels. Low-salt or salt-free chips. What foods are not recommended? The items listed may not be a complete list. Talk with your dietitian about what dietary choices are best for you. Grains Instant hot cereals. Bread stuffing, pancake, and biscuit mixes. Croutons. Seasoned rice or pasta mixes. Noodle soup cups. Boxed or frozen macaroni and cheese. Regular salted crackers. Self-rising flour. Vegetables Sauerkraut, pickled vegetables, and relishes. Olives. [place] fries. Onion rings. Regular canned vegetables (not low-sodium or reduced-sodium). Regular canned tomato sauce and paste (not low-sodium or reduced-sodium). Regular tomato and vegetable juice (not low-sodium or reduced-sodium). Frozen vegetables in sauces. Meats and other protein foods Meat or fish that is salted, canned, smoked, spiced, or pickled. Bacon, ham, sausage, hotdogs, corned beef, chipped beef, packaged lunch meats, salt pork, jerky, pickled herring, anchovies, regular canned tuna, sardines, salted nuts. Dairy Processed cheese and cheese spreads. Cheese curds. Blue cheese. Feta cheese. String cheese. Regular cottage cheese. Buttermilk. Canned milk. Fats and oils Salted butter. Regular margarine. Ghee. Bacon fat. Seasonings and other foods Onion salt, garlic salt, seasoned salt, table salt, and sea salt. Canned and packaged gravies. Worcestershire sauce. Tartar sauce. Barbecue sauce. Teriyaki sauce. Soy sauce, including reduced-sodium. Steak sauce. Fish sauce. Oyster sauce. Cocktail sauce. Horseradish that you find on the shelf. Regular ketchup and mustard. Meat flavorings and tenderizers. Bouillon cubes. Hot sauce and Tabasco sauce. Premade or packaged marinades. Premade or packaged taco seasonings. Relishes. Regular salad dressings. Salsa. Potato and tortilla chips. Corn chips and puffs. Salted popcorn and pretzels. Canned or dried soups. Pizza. Frozen entrees and  pot pies. Summary  Eating less sodium can help lower your blood pressure, reduce swelling, and protect your heart, liver, and kidneys.  Most people on this plan should limit their sodium intake to 1,500-2,000 mg (milligrams) of sodium each day.  Canned, boxed, and frozen foods are high in sodium. Restaurant foods, fast foods, and pizza are also very high in sodium. You also get sodium by adding salt to food.  Try to cook at home, eat more fresh fruits and vegetables, and eat less fast food, canned, processed, or prepared foods. This information is not intended to replace advice given to you by your health care provider. Make sure you discuss any questions you have with your health care provider. Document Released: 01/07/2002 Document Revised: 07/11/2016 Document Reviewed: 07/11/2016 Elsevier Interactive Patient Education  2018 [employer].  Weight loss will also help to decrease your blood pressure.
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
## 2017-08-11 DIAGNOSIS — M25611 Stiffness of right shoulder, not elsewhere classified: Secondary | ICD-10-CM | POA: Diagnosis not present

## 2017-08-11 DIAGNOSIS — M25511 Pain in right shoulder: Secondary | ICD-10-CM | POA: Diagnosis not present

## 2017-08-11 DIAGNOSIS — Z4789 Encounter for other orthopedic aftercare: Secondary | ICD-10-CM | POA: Diagnosis not present

## 2017-08-11 DIAGNOSIS — M75101 Unspecified rotator cuff tear or rupture of right shoulder, not specified as traumatic: Secondary | ICD-10-CM | POA: Diagnosis not present

## 2017-08-14 DIAGNOSIS — M25611 Stiffness of right shoulder, not elsewhere classified: Secondary | ICD-10-CM | POA: Diagnosis not present

## 2017-08-14 DIAGNOSIS — M25511 Pain in right shoulder: Secondary | ICD-10-CM | POA: Diagnosis not present

## 2017-08-14 DIAGNOSIS — M75101 Unspecified rotator cuff tear or rupture of right shoulder, not specified as traumatic: Secondary | ICD-10-CM | POA: Diagnosis not present

## 2017-08-14 DIAGNOSIS — Z4789 Encounter for other orthopedic aftercare: Secondary | ICD-10-CM | POA: Diagnosis not present

## 2017-08-15 DIAGNOSIS — Z4789 Encounter for other orthopedic aftercare: Secondary | ICD-10-CM | POA: Diagnosis not present

## 2017-08-15 DIAGNOSIS — M25511 Pain in right shoulder: Secondary | ICD-10-CM | POA: Diagnosis not present

## 2017-08-15 DIAGNOSIS — M25611 Stiffness of right shoulder, not elsewhere classified: Secondary | ICD-10-CM | POA: Diagnosis not present

## 2017-08-15 DIAGNOSIS — M75101 Unspecified rotator cuff tear or rupture of right shoulder, not specified as traumatic: Secondary | ICD-10-CM | POA: Diagnosis not present

## 2017-08-24 DIAGNOSIS — M25611 Stiffness of right shoulder, not elsewhere classified: Secondary | ICD-10-CM | POA: Diagnosis not present

## 2017-08-24 DIAGNOSIS — M75101 Unspecified rotator cuff tear or rupture of right shoulder, not specified as traumatic: Secondary | ICD-10-CM | POA: Diagnosis not present

## 2017-08-24 DIAGNOSIS — Z4789 Encounter for other orthopedic aftercare: Secondary | ICD-10-CM | POA: Diagnosis not present

## 2017-08-24 DIAGNOSIS — M25511 Pain in right shoulder: Secondary | ICD-10-CM | POA: Diagnosis not present

## 2017-08-25 ENCOUNTER — Other Ambulatory Visit: Payer: Self-pay | Admitting: Internal Medicine

## 2017-08-25 DIAGNOSIS — R739 Hyperglycemia, unspecified: Secondary | ICD-10-CM

## 2017-08-25 DIAGNOSIS — E785 Hyperlipidemia, unspecified: Secondary | ICD-10-CM

## 2017-08-25 DIAGNOSIS — Z79899 Other long term (current) drug therapy: Secondary | ICD-10-CM

## 2017-08-31 ENCOUNTER — Other Ambulatory Visit: Payer: Self-pay | Admitting: Cardiology

## 2017-09-01 ENCOUNTER — Other Ambulatory Visit: Payer: Self-pay | Admitting: Cardiology

## 2017-09-25 ENCOUNTER — Encounter: Payer: Self-pay | Admitting: Internal Medicine

## 2017-09-25 ENCOUNTER — Other Ambulatory Visit: Payer: Self-pay | Admitting: Internal Medicine

## 2017-09-25 ENCOUNTER — Telehealth: Payer: Self-pay | Admitting: Internal Medicine

## 2017-09-25 NOTE — Telephone Encounter (Signed)
Left message on mobile phone regarding missed fasting lab appt today. Needs follow up and was told to call office to reschedule.

## 2017-09-27 ENCOUNTER — Other Ambulatory Visit: Payer: Self-pay | Admitting: Internal Medicine

## 2017-09-28 ENCOUNTER — Other Ambulatory Visit: Payer: Medicare Other | Admitting: Internal Medicine

## 2017-09-28 DIAGNOSIS — Z79899 Other long term (current) drug therapy: Secondary | ICD-10-CM | POA: Diagnosis not present

## 2017-09-28 DIAGNOSIS — I10 Essential (primary) hypertension: Secondary | ICD-10-CM | POA: Diagnosis not present

## 2017-09-28 DIAGNOSIS — R739 Hyperglycemia, unspecified: Secondary | ICD-10-CM

## 2017-09-28 DIAGNOSIS — E785 Hyperlipidemia, unspecified: Secondary | ICD-10-CM

## 2017-09-28 NOTE — Addendum Note (Signed)
Addended by: Gregery NaVALENCIA, Lori-Ann Lindfors P on: 09/28/2017 11:08 AM   Modules accepted: Orders

## 2017-09-29 LAB — BASIC METABOLIC PANEL
BUN: 22 mg/dL (ref 7–25)
CALCIUM: 9.7 mg/dL (ref 8.6–10.3)
CO2: 30 mmol/L (ref 20–32)
Chloride: 101 mmol/L (ref 98–110)
Creat: 1.21 mg/dL (ref 0.70–1.25)
Glucose, Bld: 116 mg/dL — ABNORMAL HIGH (ref 65–99)
POTASSIUM: 4.8 mmol/L (ref 3.5–5.3)
Sodium: 137 mmol/L (ref 135–146)

## 2017-09-29 LAB — LIPID PANEL
CHOL/HDL RATIO: 3.8 (calc) (ref <5.0)
CHOLESTEROL: 149 mg/dL (ref <200)
HDL: 39 mg/dL — ABNORMAL LOW (ref >40)
LDL CHOLESTEROL (CALC): 88 mg/dL
NON-HDL CHOLESTEROL (CALC): 110 mg/dL (ref <130)
Triglycerides: 119 mg/dL (ref <150)

## 2017-09-29 LAB — HEPATIC FUNCTION PANEL
AG RATIO: 1.7 (calc) (ref 1.0–2.5)
ALBUMIN MSPROF: 4.5 g/dL (ref 3.6–5.1)
ALT: 24 U/L (ref 9–46)
AST: 25 U/L (ref 10–35)
Alkaline phosphatase (APISO): 61 U/L (ref 40–115)
BILIRUBIN DIRECT: 0.1 mg/dL (ref 0.0–0.2)
BILIRUBIN TOTAL: 0.7 mg/dL (ref 0.2–1.2)
GLOBULIN: 2.6 g/dL (ref 1.9–3.7)
Indirect Bilirubin: 0.6 mg/dL (calc) (ref 0.2–1.2)
Total Protein: 7.1 g/dL (ref 6.1–8.1)

## 2017-09-29 LAB — HEMOGLOBIN A1C
HEMOGLOBIN A1C: 5.8 %{Hb} — AB (ref <5.7)
MEAN PLASMA GLUCOSE: 120 (calc)
eAG (mmol/L): 6.6 (calc)

## 2017-10-13 DIAGNOSIS — K862 Cyst of pancreas: Secondary | ICD-10-CM

## 2017-10-23 ENCOUNTER — Other Ambulatory Visit: Payer: Self-pay | Admitting: Cardiology

## 2017-10-25 ENCOUNTER — Ambulatory Visit
Admission: RE | Admit: 2017-10-25 | Discharge: 2017-10-25 | Disposition: A | Discharge status: Home / Self Care | Payer: Medicare Other | Source: Ambulatory Visit | Attending: Internal Medicine | Admitting: Internal Medicine

## 2017-10-25 DIAGNOSIS — K862 Cyst of pancreas: Secondary | ICD-10-CM | POA: Diagnosis not present

## 2017-10-25 DIAGNOSIS — R935 Abnormal findings on diagnostic imaging of other abdominal regions, including retroperitoneum: Secondary | ICD-10-CM | POA: Diagnosis not present

## 2017-10-25 MED ORDER — GADOBENATE DIMEGLUMINE 529 MG/ML IV SOLN
20.0000 mL | Freq: Once | INTRAVENOUS | Status: AC | PRN
  Administered 2017-10-25: 20 mL via INTRAVENOUS

## 2017-10-26 NOTE — Progress Notes (Signed)
Good news the pancreatic tail cyst is stable Protocol recommends recheck MRI in March 2021  Please place a reminder and let him know and if he has any ? Let me know

## 2018-01-01 DIAGNOSIS — K045 Chronic apical periodontitis: Secondary | ICD-10-CM | POA: Diagnosis not present

## 2018-05-23 ENCOUNTER — Other Ambulatory Visit: Payer: Self-pay | Admitting: Cardiology

## 2018-06-17 ENCOUNTER — Emergency Department (HOSPITAL_COMMUNITY)
Admission: EM | Admit: 2018-06-17 | Discharge: 2018-06-17 | Disposition: A | Discharge status: Home / Self Care | Payer: Medicare Other | Attending: Emergency Medicine | Admitting: Emergency Medicine

## 2018-06-17 ENCOUNTER — Encounter (HOSPITAL_COMMUNITY): Payer: Self-pay | Admitting: Emergency Medicine

## 2018-06-17 ENCOUNTER — Emergency Department (HOSPITAL_COMMUNITY): Payer: Medicare Other

## 2018-06-17 DIAGNOSIS — I10 Essential (primary) hypertension: Secondary | ICD-10-CM | POA: Insufficient documentation

## 2018-06-17 DIAGNOSIS — Z79899 Other long term (current) drug therapy: Secondary | ICD-10-CM | POA: Insufficient documentation

## 2018-06-17 DIAGNOSIS — I2510 Atherosclerotic heart disease of native coronary artery without angina pectoris: Secondary | ICD-10-CM | POA: Insufficient documentation

## 2018-06-17 DIAGNOSIS — M79671 Pain in right foot: Secondary | ICD-10-CM

## 2018-06-17 DIAGNOSIS — Z7982 Long term (current) use of aspirin: Secondary | ICD-10-CM | POA: Diagnosis not present

## 2018-06-17 NOTE — ED Triage Notes (Signed)
Pt here with right side foot pain times 1 week , no trauma

## 2018-06-17 NOTE — ED Notes (Signed)
Pt stable, ambulatory, states understanding of discharge instructions 

## 2018-06-17 NOTE — Discharge Instructions (Addendum)
You can take Aleve, twice daily for the next three days. Take Pepcid twice daily while taking Aleve to help protect your stomach.

## 2018-06-17 NOTE — ED Notes (Signed)
Radiology at bedside

## 2018-06-17 NOTE — ED Provider Notes (Signed)
[facility] Provider Note   CSN: [id number] Arrival date & time: 06/17/18  1055     History   Chief Complaint No chief complaint on file.   HPI David Cantu is a 62 y.o. male.  The history is provided by the patient. No language interpreter was used.   David Cantu is a 62 y.o. male who presents to the Emergency Department complaining of foot pain. Since for evaluation of right lateral foot pain. Two weeks ago he had a fall but did not harness for that time. One week ago he had a few days of pain in the lateral foot that then completely resolved. Last night he developed recurrent pain to the lateral foot. He feels like a small mayor. He denies any fevers, chills, malaise. No prior similar symptoms. Pain is constant nature and worse with walking. He has a history of pancreatitis as well as coronary artery disease status post stent. He is not diabetic. No history of gout.   Past Medical History:  Diagnosis Date  . Acute pancreatitis 02/03/2014; 05/12/2016  . Arthritis    "shoulders" (05/12/2016)  . CAD (coronary artery disease)    a. L&R cath 07/09/15 normal LV function, no MR or AS, normal R heart pressure, No pulmonary HTN, 70% RCA s/p DES  . NWGNFAOZ(308.6Headache(784.0)    "couple times/month" (05/12/2016)  . Heart murmur   . Hypertension   . Migraine    "might have 5-6/year" (05/12/2016)  . Pneumonia 1960; ~ 1992  . Pre-diabetes   . Ringing in the ears    "continuous ringing in my ears" (05/12/2016)  . Shingles     Patient Active Problem List   Diagnosis Date Noted  . Acute pancreatitis 05/12/2016  . Hyponatremia 05/12/2016  . Coronary artery disease 05/12/2016  . Essential hypertension, benign 08/07/2015  . Stented coronary artery   . Hypertensive heart disease without heart failure   . Unstable angina (HCC) 07/09/2015  . Coronary artery disease involving native coronary artery of native heart with unstable angina pectoris (HCC)     . Bilateral shoulder pain 02/03/2015  . Pruritic rash 11/20/2014  . Preventative health care 06/03/2014  . Hyperlipidemia 02/25/2014  . Post herpetic neuralgia 02/25/2014  . Dyspnea 02/13/2014  . Hx of acute pancreatitis 02/03/2014  . Essential hypertension 02/03/2014    Past Surgical History:  Procedure Laterality Date  . CARDIAC CATHETERIZATION N/A 07/09/2015   Procedure: Right/Left Heart Cath and Coronary Angiography;  Surgeon: Jake BatheMark C Skains, MD;  [facility];  Service: Cardiovascular;  Laterality: N/A;  . CARDIAC CATHETERIZATION N/A 07/09/2015   Procedure: Intravascular Pressure Wire/FFR Study;  Surgeon: Kathleene Hazelhristopher D McAlhany, MD;  [facility];  Service: Cardiovascular;  Laterality: N/A;  . CARDIAC CATHETERIZATION N/A 07/09/2015   Procedure: Coronary Stent Intervention;  Surgeon: Kathleene Hazelhristopher D McAlhany, MD;  Location: [facility] INVASIVE CV LAB;  Service: Cardiovascular;  Laterality: N/A;  Distal RCA  . SHOULDER ARTHROSCOPY W/ ROTATOR CUFF REPAIR Left 2011  . TRACHEOSTOMY  1960  . TRACHEOSTOMY CLOSURE  1960        Home Medications    Prior to Admission medications   Medication Sig Start Date End Date Taking? Authorizing Provider  acetaminophen (TYLENOL) 325 MG tablet Take 325-650 mg by mouth at bedtime as needed (for shoulder pain).     [provider]  aspirin 81 MG chewable tablet Chew 1 tablet (81 mg total) by mouth daily. 07/10/15   Manson PasseyBhagat, Bhavinkumar, PA  atorvastatin (LIPITOR) 80 MG tablet TAKE 1 TABLET(80 MG) BY MOUTH DAILY AT 6 PM 08/31/17   Jake Bathe, MD  carvedilol (COREG) 6.25 MG tablet TAKE 1 TABLET(6.25 MG) BY MOUTH TWICE DAILY WITH A MEAL 09/01/17   Jake Bathe, MD  lisinopril (PRINIVIL,ZESTRIL) 10 MG tablet Take 1 tablet (10 mg total) by mouth daily. Please keep upcoming appt in January with Dr. Anne Fu for future refills. Thank you 05/23/18   Jake Bathe, MD  nitroGLYCERIN (NITROSTAT) 0.4 MG SL tablet Place 1 tablet (0.4  mg total) under the tongue every 5 (five) minutes as needed for chest pain. 02/09/16   Jake Bathe, MD  pantoprazole (PROTONIX) 40 MG tablet TAKE 1 TABLET(40 MG) BY MOUTH DAILY 08/31/17   Jake Bathe, MD    Family History Family History  Problem Relation Age of Onset  . Lung cancer Mother   . Lung cancer Father   . Colon cancer Neg Hx     Social History Social History   Tobacco Use  . Smoking status: Never Smoker  . Smokeless tobacco: Never Used  Substance Use Topics  . Alcohol use: Yes    Alcohol/week: 0.0 standard drinks    Comment: 05/12/2016 "might have a few drinks/month"  . Drug use: No     Allergies   Zenpep [pancrelipase (lip-prot-amyl)]   Review of Systems Review of Systems  All other systems reviewed and are negative.    Physical Exam Updated Vital Signs BP (!) 169/91 (BP Location: Right Arm)   Pulse 66   Temp 98.8 F (37.1 C) (Oral)   Resp 20   SpO2 97%   Physical Exam  Constitutional: He is oriented to person, place, and time. He appears well-developed and well-nourished.  HENT:  Head: Normocephalic and atraumatic.  Cardiovascular: Normal rate and regular rhythm.  No murmur heard. Pulmonary/Chest: Effort normal and breath sounds normal. No respiratory distress.  Abdominal: Soft. There is no tenderness. There is no rebound and no guarding.  Musculoskeletal:  2+ DP pulses bilaterally. Range of motion in sensation to light touch intact throughout the right foot and ankle. There is tenderness to palpation over the right lateral mid foot with mild local swelling. No erythema. There is mild tenderness to palpation over the base of the fifth metatarsal.  Neurological: He is alert and oriented to person, place, and time.  Skin: Skin is warm and dry.  Psychiatric: He has a normal mood and affect. His behavior is normal.  Nursing note and vitals reviewed.    ED Treatments / Results  Labs (all labs ordered are listed, but only abnormal results are  displayed) Labs Reviewed - No data to display  EKG None  Radiology No results found.  Procedures Procedures (including critical care time)  Medications Ordered in ED Medications - No data to display   Initial Impression / Assessment and Plan / ED Course  I have reviewed the triage vital signs and the nursing notes.  Pertinent labs & imaging results that were available during my care of the patient were reviewed by me and considered in my medical decision making (see chart for details).     Patient presents for evaluation of right foot pain. He has mild swelling and tenderness on examination. There is no exam evidence of gouty arthritis or septic arthritis. No cellulitis. No evidence of acute fracture or dislocation. Discussed with patient unclear source of his symptoms. Recommend anti-inflammatories twice daily for three days with outpatient follow-up and return  precautions.  Final Clinical Impressions(s) / ED Diagnoses   Final diagnoses:  None    ED Discharge Orders    None       Tilden Fossa, MD 06/17/18 1155

## 2018-06-20 ENCOUNTER — Ambulatory Visit (INDEPENDENT_AMBULATORY_CARE_PROVIDER_SITE_OTHER): Payer: Medicare Other | Admitting: Internal Medicine

## 2018-06-20 DIAGNOSIS — Z23 Encounter for immunization: Secondary | ICD-10-CM

## 2018-06-20 NOTE — Patient Instructions (Signed)
Patient received a flu vaccine IM L deltoid, AV, CMA  

## 2018-07-09 ENCOUNTER — Encounter: Payer: Self-pay | Admitting: Cardiology

## 2018-07-11 DIAGNOSIS — M25512 Pain in left shoulder: Secondary | ICD-10-CM | POA: Diagnosis not present

## 2018-07-13 ENCOUNTER — Telehealth: Payer: Self-pay

## 2018-07-13 DIAGNOSIS — Z0000 Encounter for general adult medical examination without abnormal findings: Secondary | ICD-10-CM

## 2018-07-13 DIAGNOSIS — E785 Hyperlipidemia, unspecified: Secondary | ICD-10-CM

## 2018-07-16 ENCOUNTER — Other Ambulatory Visit: Payer: Medicare Other | Admitting: Internal Medicine

## 2018-07-16 ENCOUNTER — Other Ambulatory Visit: Payer: Self-pay | Admitting: Internal Medicine

## 2018-07-16 DIAGNOSIS — M79671 Pain in right foot: Secondary | ICD-10-CM | POA: Diagnosis not present

## 2018-07-16 DIAGNOSIS — Z0000 Encounter for general adult medical examination without abnormal findings: Secondary | ICD-10-CM

## 2018-07-16 DIAGNOSIS — E785 Hyperlipidemia, unspecified: Secondary | ICD-10-CM

## 2018-07-18 ENCOUNTER — Encounter: Payer: Self-pay | Admitting: Internal Medicine

## 2018-07-18 ENCOUNTER — Ambulatory Visit (INDEPENDENT_AMBULATORY_CARE_PROVIDER_SITE_OTHER): Payer: Medicare Other | Admitting: Internal Medicine

## 2018-07-18 VITALS — BP 140/80 | HR 60 | Temp 98.0°F | Ht 64.5 in | Wt 173.0 lb

## 2018-07-18 DIAGNOSIS — M79671 Pain in right foot: Secondary | ICD-10-CM

## 2018-07-18 DIAGNOSIS — E785 Hyperlipidemia, unspecified: Secondary | ICD-10-CM

## 2018-07-18 DIAGNOSIS — I10 Essential (primary) hypertension: Secondary | ICD-10-CM | POA: Diagnosis not present

## 2018-07-18 DIAGNOSIS — Z0000 Encounter for general adult medical examination without abnormal findings: Secondary | ICD-10-CM

## 2018-07-18 DIAGNOSIS — K859 Acute pancreatitis without necrosis or infection, unspecified: Secondary | ICD-10-CM

## 2018-07-18 DIAGNOSIS — E781 Pure hyperglyceridemia: Secondary | ICD-10-CM | POA: Diagnosis not present

## 2018-07-18 DIAGNOSIS — M25512 Pain in left shoulder: Secondary | ICD-10-CM

## 2018-07-18 LAB — POCT URINALYSIS DIPSTICK
Appearance: NEGATIVE
Bilirubin, UA: NEGATIVE
Glucose, UA: NEGATIVE
Ketones, UA: NEGATIVE
LEUKOCYTES UA: NEGATIVE
Nitrite, UA: NEGATIVE
Odor: NEGATIVE
PROTEIN UA: NEGATIVE
RBC UA: NEGATIVE
SPEC GRAV UA: 1.01 (ref 1.010–1.025)
Urobilinogen, UA: 0.2 E.U./dL
pH, UA: 6.5 (ref 5.0–8.0)

## 2018-07-18 MED ORDER — MELOXICAM 15 MG PO TABS: 15.0000 mg | ORAL_TABLET | Freq: Every day | ORAL | 0 refills | Status: DC
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
## 2018-07-20 LAB — COMPREHENSIVE METABOLIC PANEL
AG Ratio: 1.6 (calc) (ref 1.0–2.5)
ALKALINE PHOSPHATASE (APISO): 58 U/L (ref 40–115)
ALT: 20 U/L (ref 9–46)
AST: 14 U/L (ref 10–35)
Albumin: 4.5 g/dL (ref 3.6–5.1)
BILIRUBIN TOTAL: 0.8 mg/dL (ref 0.2–1.2)
BUN: 20 mg/dL (ref 7–25)
CO2: 25 mmol/L (ref 20–32)
Calcium: 10.1 mg/dL (ref 8.6–10.3)
Chloride: 98 mmol/L (ref 98–110)
Creat: 1.09 mg/dL (ref 0.70–1.25)
Globulin: 2.8 g/dL (calc) (ref 1.9–3.7)
Glucose, Bld: 101 mg/dL — ABNORMAL HIGH (ref 65–99)
Potassium: 4.4 mmol/L (ref 3.5–5.3)
Sodium: 134 mmol/L — ABNORMAL LOW (ref 135–146)
Total Protein: 7.3 g/dL (ref 6.1–8.1)

## 2018-07-20 LAB — URIC ACID: URIC ACID, SERUM: 7 mg/dL (ref 4.0–8.0)

## 2018-07-20 LAB — PSA: PSA: 1.6 ng/mL (ref <=4.0)

## 2018-07-20 LAB — LIPID PANEL
CHOLESTEROL: 184 mg/dL (ref <200)
HDL: 49 mg/dL (ref >40)
LDL CHOLESTEROL (CALC): 95 mg/dL
Non-HDL Cholesterol (Calc): 135 mg/dL (calc) — ABNORMAL HIGH (ref <130)
Total CHOL/HDL Ratio: 3.8 (calc) (ref <5.0)
Triglycerides: 282 mg/dL — ABNORMAL HIGH (ref <150)

## 2018-07-20 LAB — TEST AUTHORIZATION

## 2018-07-20 LAB — CBC WITH DIFFERENTIAL/PLATELET
Absolute Monocytes: 701 cells/uL (ref 200–950)
BASOS PCT: 0.4 %
Basophils Absolute: 38 cells/uL (ref 0–200)
EOS PCT: 2 %
Eosinophils Absolute: 192 cells/uL (ref 15–500)
HCT: 45.6 % (ref 38.5–50.0)
Hemoglobin: 15.5 g/dL (ref 13.2–17.1)
Lymphs Abs: 1430 cells/uL (ref 850–3900)
MCH: 28.8 pg (ref 27.0–33.0)
MCHC: 34 g/dL (ref 32.0–36.0)
MCV: 84.8 fL (ref 80.0–100.0)
MPV: 10.3 fL (ref 7.5–12.5)
Monocytes Relative: 7.3 %
NEUTROS ABS: 7238 {cells}/uL (ref 1500–7800)
Neutrophils Relative %: 75.4 %
Platelets: 266 10*3/uL (ref 140–400)
RBC: 5.38 10*6/uL (ref 4.20–5.80)
RDW: 12.6 % (ref 11.0–15.0)
Total Lymphocyte: 14.9 %
WBC: 9.6 10*3/uL (ref 3.8–10.8)

## 2018-07-26 ENCOUNTER — Encounter: Payer: Self-pay | Admitting: Cardiology

## 2018-07-31 NOTE — Patient Instructions (Addendum)
Continue current medications.  Lipids need to be repeated after trial of exercise.  Follow-up in January with lipid panel only.

## 2018-08-06 DIAGNOSIS — H5213 Myopia, bilateral: Secondary | ICD-10-CM | POA: Diagnosis not present

## 2018-08-10 ENCOUNTER — Ambulatory Visit: Payer: Medicare Other | Admitting: Cardiology

## 2018-08-21 ENCOUNTER — Other Ambulatory Visit: Payer: Self-pay | Admitting: Cardiology

## 2018-08-22 ENCOUNTER — Encounter: Payer: Self-pay | Admitting: Cardiology

## 2018-08-22 ENCOUNTER — Ambulatory Visit: Payer: Medicare Other | Admitting: Cardiology

## 2018-08-22 VITALS — BP 142/80 | HR 56 | Ht 64.5 in | Wt 174.8 lb

## 2018-08-22 DIAGNOSIS — E7800 Pure hypercholesterolemia, unspecified: Secondary | ICD-10-CM

## 2018-08-22 DIAGNOSIS — I10 Essential (primary) hypertension: Secondary | ICD-10-CM | POA: Diagnosis not present

## 2018-08-22 DIAGNOSIS — I2510 Atherosclerotic heart disease of native coronary artery without angina pectoris: Secondary | ICD-10-CM | POA: Diagnosis not present

## 2018-08-22 DIAGNOSIS — I2583 Coronary atherosclerosis due to lipid rich plaque: Secondary | ICD-10-CM | POA: Diagnosis not present
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
## 2018-08-22 NOTE — Patient Instructions (Signed)
Medication Instructions:  The current medical regimen is effective;  continue present plan and medications.  If you need a refill on your cardiac medications before your next appointment, please call your pharmacy.   Follow-Up: At [facility], you and your health needs are our priority.  As part of our continuing mission to provide you with exceptional heart care, we have created designated Provider Care Teams.  These Care Teams include your primary Cardiologist (physician) and Advanced Practice Providers (APPs -  Physician Assistants and Nurse Practitioners) who all work together to provide you with the care you need, when you need it. You will need a follow up appointment in 12 months.  Please call our office 2 months in advance to schedule this appointment.  You may see Mark Skains, MD or one of the following Advanced Practice Providers on your designated Care Team:   Lori Gerhardt, NP Laura Ingold, NP . Jill McDaniel, NP  Thank you for choosing [facility]!!      

## 2018-08-27 ENCOUNTER — Other Ambulatory Visit: Payer: Medicare Other | Admitting: Internal Medicine

## 2018-08-27 DIAGNOSIS — E785 Hyperlipidemia, unspecified: Secondary | ICD-10-CM

## 2018-08-28 LAB — LIPID PANEL
CHOL/HDL RATIO: 3.9 (calc) (ref <5.0)
CHOLESTEROL: 165 mg/dL (ref <200)
HDL: 42 mg/dL (ref >40)
LDL Cholesterol (Calc): 99 mg/dL (calc)
Non-HDL Cholesterol (Calc): 123 mg/dL (calc) (ref <130)
Triglycerides: 147 mg/dL (ref <150)

## 2018-09-02 ENCOUNTER — Other Ambulatory Visit: Payer: Self-pay | Admitting: Cardiology

## 2018-09-14 DIAGNOSIS — H25013 Cortical age-related cataract, bilateral: Secondary | ICD-10-CM | POA: Diagnosis not present

## 2018-09-14 DIAGNOSIS — H02831 Dermatochalasis of right upper eyelid: Secondary | ICD-10-CM | POA: Diagnosis not present

## 2018-09-14 DIAGNOSIS — I10 Essential (primary) hypertension: Secondary | ICD-10-CM | POA: Diagnosis not present

## 2018-09-14 DIAGNOSIS — H2513 Age-related nuclear cataract, bilateral: Secondary | ICD-10-CM | POA: Diagnosis not present

## 2018-09-24 DIAGNOSIS — H2511 Age-related nuclear cataract, right eye: Secondary | ICD-10-CM | POA: Diagnosis not present

## 2018-09-25 DIAGNOSIS — H2512 Age-related nuclear cataract, left eye: Secondary | ICD-10-CM | POA: Diagnosis not present

## 2018-10-21 ENCOUNTER — Other Ambulatory Visit: Payer: Self-pay

## 2018-10-21 ENCOUNTER — Emergency Department (HOSPITAL_COMMUNITY)
Admission: EM | Admit: 2018-10-21 | Discharge: 2018-10-21 | Disposition: A | Discharge status: Home / Self Care | Payer: Medicare Other | Attending: Emergency Medicine | Admitting: Emergency Medicine

## 2018-10-21 ENCOUNTER — Emergency Department (HOSPITAL_COMMUNITY): Payer: Medicare Other

## 2018-10-21 ENCOUNTER — Encounter (HOSPITAL_COMMUNITY): Payer: Self-pay

## 2018-10-21 DIAGNOSIS — S60142A Contusion of left ring finger with damage to nail, initial encounter: Secondary | ICD-10-CM | POA: Diagnosis not present

## 2018-10-21 DIAGNOSIS — Z7982 Long term (current) use of aspirin: Secondary | ICD-10-CM | POA: Insufficient documentation

## 2018-10-21 DIAGNOSIS — Y999 Unspecified external cause status: Secondary | ICD-10-CM | POA: Insufficient documentation

## 2018-10-21 DIAGNOSIS — S62635A Displaced fracture of distal phalanx of left ring finger, initial encounter for closed fracture: Secondary | ICD-10-CM | POA: Diagnosis not present

## 2018-10-21 DIAGNOSIS — Z79899 Other long term (current) drug therapy: Secondary | ICD-10-CM | POA: Insufficient documentation

## 2018-10-21 DIAGNOSIS — S62639A Displaced fracture of distal phalanx of unspecified finger, initial encounter for closed fracture: Secondary | ICD-10-CM

## 2018-10-21 DIAGNOSIS — Y939 Activity, unspecified: Secondary | ICD-10-CM | POA: Insufficient documentation

## 2018-10-21 DIAGNOSIS — I10 Essential (primary) hypertension: Secondary | ICD-10-CM | POA: Diagnosis not present

## 2018-10-21 DIAGNOSIS — W270XXA Contact with workbench tool, initial encounter: Secondary | ICD-10-CM | POA: Diagnosis not present

## 2018-10-21 DIAGNOSIS — S60945A Unspecified superficial injury of left ring finger, initial encounter: Secondary | ICD-10-CM | POA: Diagnosis present

## 2018-10-21 DIAGNOSIS — Y929 Unspecified place or not applicable: Secondary | ICD-10-CM | POA: Insufficient documentation

## 2018-10-21 DIAGNOSIS — I2510 Atherosclerotic heart disease of native coronary artery without angina pectoris: Secondary | ICD-10-CM | POA: Insufficient documentation

## 2018-10-21 DIAGNOSIS — S6010XA Contusion of unspecified finger with damage to nail, initial encounter: Secondary | ICD-10-CM

## 2018-10-21 DIAGNOSIS — S62634A Displaced fracture of distal phalanx of right ring finger, initial encounter for closed fracture: Secondary | ICD-10-CM | POA: Insufficient documentation

## 2018-10-21 NOTE — ED Triage Notes (Signed)
Onset yesterday pt was working in yard, hit left ring finger with mallet.  Finger bruised and fingernail darkened.  Can move finger.

## 2018-10-21 NOTE — ED Provider Notes (Signed)
[facility] EMERGENCY DEPARTMENT Provider Note   CSN: [id number] Arrival date & time: 10/21/18  1948    History   Chief Complaint Chief Complaint  Patient presents with   Finger Injury    HPI David Cantu is a 63 y.o. male with a hx of CAD, HTN, & pancreatitis who presents to the ED with complaints of L ring finger injury that occurred yesterday. Patient states that he was using a mallet hammer when he accidentally struck the L ring finger. Notes bruising, swelling, & pain to the area. Pain is fairly mild, worse with movement, no alleviating factors. Denies numbness, tingling, weakness or other areas of injury. Last tetanus was  In 2018. Patient is R hand dominant.      HPI  Past Medical History:  Diagnosis Date   Acute pancreatitis 02/03/2014; 05/12/2016   Arthritis    "shoulders" (05/12/2016)   CAD (coronary artery disease)    a. L&R cath 07/09/15 normal LV function, no MR or AS, normal R heart pressure, No pulmonary HTN, 70% RCA s/p DES   Headache(784.0)    "couple times/month" (05/12/2016)   Heart murmur    Hypertension    Migraine    "might have 5-6/year" (05/12/2016)   Pneumonia 1960; ~ 1992   Pre-diabetes    Ringing in the ears    "continuous ringing in my ears" (05/12/2016)   Shingles     Patient Active Problem List   Diagnosis Date Noted   Acute pancreatitis 05/12/2016   Hyponatremia 05/12/2016   Coronary artery disease 05/12/2016   Essential hypertension, benign 08/07/2015   Stented coronary artery    Hypertensive heart disease without heart failure    Unstable angina (HCC) 07/09/2015   Coronary artery disease involving native coronary artery of native heart with unstable angina pectoris (HCC)    Bilateral shoulder pain 02/03/2015   Pruritic rash 11/20/2014   Preventative health care 06/03/2014   Hyperlipidemia 02/25/2014   Post herpetic neuralgia 02/25/2014   Dyspnea 02/13/2014   Hx of acute  pancreatitis 02/03/2014   Essential hypertension 02/03/2014    Past Surgical History:  Procedure Laterality Date   CARDIAC CATHETERIZATION N/A 07/09/2015   Procedure: Right/Left Heart Cath and Coronary Angiography;  Surgeon: Jake Bathe, MD;  Location: [facility] INVASIVE CV LAB;  Service: Cardiovascular;  Laterality: N/A;   CARDIAC CATHETERIZATION N/A 07/09/2015   Procedure: Intravascular Pressure Wire/FFR Study;  Surgeon: Kathleene Hazel, MD;  Location: [facility] INVASIVE CV LAB;  Service: Cardiovascular;  Laterality: N/A;   CARDIAC CATHETERIZATION N/A 07/09/2015   Procedure: Coronary Stent Intervention;  Surgeon: Kathleene Hazel, MD;  Location: [facility] INVASIVE CV LAB;  Service: Cardiovascular;  Laterality: N/A;  Distal RCA   SHOULDER ARTHROSCOPY W/ ROTATOR CUFF REPAIR Left 2011   TRACHEOSTOMY  1960   TRACHEOSTOMY CLOSURE  1960        Home Medications    Prior to Admission medications   Medication Sig Start Date End Date Taking? Authorizing Provider  acetaminophen (TYLENOL) 325 MG tablet Take 325-650 mg by mouth at bedtime as needed (for shoulder pain).     [provider]  aspirin 81 MG chewable tablet Chew 1 tablet (81 mg total) by mouth daily. 07/10/15   Bhagat, Sharrell Ku, PA  atorvastatin (LIPITOR) 80 MG tablet TAKE 1 TABLET(80 MG) BY MOUTH DAILY AT 6 PM 09/03/18   Jake Bathe, MD  carvedilol (COREG) 6.25 MG tablet TAKE 1 TABLET(6.25 MG) BY MOUTH TWICE DAILY WITH A MEAL 09/03/18  Jake Bathe, MD  lisinopril (PRINIVIL,ZESTRIL) 10 MG tablet TAKE 1 TABLET BY MOUTH DAILY 09/03/18   Jake Bathe, MD  meloxicam (MOBIC) 15 MG tablet Take 1 tablet (15 mg total) by mouth daily. 07/18/18   [name], MD  nitroGLYCERIN (NITROSTAT) 0.4 MG SL tablet Place 1 tablet (0.4 mg total) under the tongue every 5 (five) minutes as needed for chest pain. 02/09/16   Jake Bathe, MD  pantoprazole (PROTONIX) 40 MG tablet TAKE 1 TABLET(40 MG) BY MOUTH DAILY 09/03/18   Jake Bathe, MD      Family History Family History  Problem Relation Age of Onset   Lung cancer Mother    Lung cancer Father    Colon cancer Neg Hx     Social History Social History   Tobacco Use   Smoking status: Never Smoker   Smokeless tobacco: Never Used  Substance Use Topics   Alcohol use: Yes    Alcohol/week: 0.0 standard drinks    Comment: 05/12/2016 "might have a few drinks/month"   Drug use: No     Allergies   Zenpep [pancrelipase (lip-prot-amyl)]   Review of Systems Review of Systems  Constitutional: Negative for chills and fever.  Musculoskeletal: Positive for arthralgias and joint swelling.  Skin: Positive for color change (bruising).  Neurological: Negative for weakness and numbness.     Physical Exam Updated Vital Signs BP (!) 154/71 (BP Location: Left Arm)    Pulse 69    Temp 98.2 F (36.8 C) (Oral)    Resp 19    Ht 5\' 6"  (1.676 m)    Wt 77.1 kg    SpO2 97%    BMI 27.44 kg/m   Physical Exam Vitals signs and nursing note reviewed.  Constitutional:      General: He is not in acute distress.    Appearance: Normal appearance. He is not ill-appearing or toxic-appearing.  HENT:     Head: Normocephalic and atraumatic.  Neck:     Musculoskeletal: Normal range of motion and neck supple.     Comments: No midline tenderness.  Cardiovascular:     Rate and Rhythm: Normal rate.     Pulses:          Radial pulses are 2+ on the right side and 2+ on the left side.  Pulmonary:     Effort: No respiratory distress.     Breath sounds: Normal breath sounds.  Musculoskeletal:     Comments: Upper extremities: Patient's L ring finger: subungual hematoma noted to proximal 1/2 of the nailbed. Middle & distal phalanx are swollen with some ecchymosis. No open wounds. No obvious deformity. Patient has intact AROM throughout. Tender to palpation to distal phalanx including nailbed palpation with some very mild tenderness to the middle phalanx & DIP. UEs are otherwise nontender    Skin:    General: Skin is warm and dry.     Capillary Refill: Capillary refill takes less than 2 seconds.  Neurological:     Mental Status: He is alert.     Comments: Alert. Clear speech. Sensation grossly intact to bilateral upper extremities. 5/5 symmetric grip strength. Ambulatory. Able to perform OK sign, thumbs up, and cross 2nd/3rd digits.   Psychiatric:        Mood and Affect: Mood normal.        Behavior: Behavior normal.      ED Treatments / Results  Labs (all labs ordered are listed, but only abnormal results are displayed) Labs  Reviewed - No data to display  EKG None  Radiology Dg Hand Complete Left  Result Date: 10/21/2018 CLINICAL DATA:  63 year old male status post blunt trauma to the left ring finger yesterday with mallet. Pain and discoloration. EXAM: LEFT HAND - COMPLETE 3+ VIEW COMPARISON:  None. FINDINGS: Comminuted fracture through the tuft of the left 4th distal phalanx, primarily in the midline and along the ulnar side. Multiple mildly displaced comminution fragments. Regional soft tissue swelling. The 4th DIP remains intact. Other joint spaces and osseous structures are normal for age. IMPRESSION: Comminuted fracture through the tuft of the left 4th distal phalanx. Electronically Signed   By: Odessa Fleming M.D.   On: 10/21/2018 21:35    Procedures Procedures (including critical care time)  SPLINT APPLICATION Date/Time: 10:13 PM Authorized by: Harvie Heck Consent: Verbal consent obtained. Risks and benefits: risks, benefits and alternatives were discussed Consent given by: patient Splint applied by: RN Location details: L 4th finger Splint type: aluminum.  Post-procedure: The splinted body part was neurovascularly unchanged following the procedure. Patient tolerance: Patient tolerated the procedure well with no immediate complications.  Medications Ordered in ED Medications - No data to display   Initial Impression / Assessment and Plan / ED  Course  I have reviewed the triage vital signs and the nursing notes.  Pertinent labs & imaging results that were available during my care of the patient were reviewed by me and considered in my medical decision making (see chart for details).   Patient presents to the ED s/p L 4th finger injury that occurred yesterday. Nontoxic appearing, no apparent distress, vitals WNL with the exception of elevated BP- doubt HTN emergency. Exam with subungual hematoma noted to proximal 1/2 of nailbed, also w some swelling & ecchymosis to distal & middle phalanx. No signs of infection. AROM intact. Tender primarily to distal phalanx, mildly to DIP/middle phalanx.. Offered trephination of subungual hematoma, discussed risks/benefits, ultimately patient not in that much pain and declined procedure which I feel is reasonable.   X-ray notable for comminuted fracture through the tuft of the left 4th distal phalanx, I have personally reviewed imaging.   Findings and plan of care discussed with supervising physician Dr. Charm Barges, who advised could be considered open fx with subungual hematoma, but would not do abx, recommends trephination if patient amenable (which he declined), splint & follow up, I am in agreement.   Patient placed in finger splint. Recommended PRICE. Hand surgery follow up. I discussed results, treatment plan, need for follow-up, and return precautions with the patient. Provided opportunity for questions, patient confirmed understanding and is in agreement with plan.    Final Clinical Impressions(s) / ED Diagnoses   Final diagnoses:  Closed fracture of tuft of distal phalanx of finger  Subungual hematoma of digit of hand, initial encounter    ED Discharge Orders    None       Desmond Lope 10/21/18 2213    Terrilee Files, MD 10/22/18 1214

## 2018-10-21 NOTE — Discharge Instructions (Addendum)
Please read and follow all provided instructions.  You have been seen today for a finger injury. Your x-ray shows that your distal phalanx in your finger is fractured. We have placed you in a splint for this. Keep the splint on until you have followed up with [facility] or your primary care provider within 1 week for further recommendations.   Home care instructions: -- *PRICE in the first 24-48 hours after injury: Protect (with brace, splint, sling), if given by your provider Rest Ice- Do not apply ice pack directly to your skin, place towel or similar between your skin and ice/ice pack. Apply ice for 20 min, then remove for 40 min while awake Compression- Wear brace, elastic bandage, splint as directed by your provider Elevate affected extremity above the level of your heart when not walking around for the first 24-48 hours   Follow-up instructions: Please follow-up with your primary care provider or the provided orthopedic physician (bone specialist) if you continue to have significant pain in 1 week. In this case you may have a more severe injury that requires further care.   Return instructions:  Please return if your digits or extremity are numb or tingling, appear gray or blue, or you have severe pain (also elevate the extremity and loosen splint or wrap if you were given one) Please return if you have redness or fevers.  Please return to the Emergency Department if you experience worsening symptoms.  Please return if you have any other emergent concerns. Additional Information:  Your vital signs today were: BP (!) 142/62    Pulse 74    Temp 98.2 F (36.8 C) (Oral)    Resp 18    SpO2 98%  If your blood pressure (BP) was elevated above 135/85 this visit, please have this repeated by your doctor within one month. ---------------

## 2018-11-29 ENCOUNTER — Other Ambulatory Visit: Payer: Self-pay | Admitting: Cardiology

## 2018-12-10 DIAGNOSIS — H2512 Age-related nuclear cataract, left eye: Secondary | ICD-10-CM | POA: Diagnosis not present

## 2019-02-27 ENCOUNTER — Other Ambulatory Visit: Payer: Self-pay | Admitting: Cardiology

## 2019-03-29 ENCOUNTER — Other Ambulatory Visit: Payer: Self-pay

## 2019-03-29 ENCOUNTER — Telehealth: Payer: Self-pay | Admitting: Internal Medicine

## 2019-03-29 DIAGNOSIS — Z20822 Contact with and (suspected) exposure to covid-19: Secondary | ICD-10-CM

## 2019-03-29 DIAGNOSIS — R6889 Other general symptoms and signs: Secondary | ICD-10-CM | POA: Diagnosis not present

## 2019-03-29 DIAGNOSIS — Z20828 Contact with and (suspected) exposure to other viral communicable diseases: Secondary | ICD-10-CM

## 2019-03-29 NOTE — Telephone Encounter (Signed)
Raymond Bhardwaj [phone]  Purcell Nails called to say he has been traveling a lot with his job, been to 3 different states and his pregant daughter is coming in for the Labor Day holiday and he would like to be tested for COVID to make sure he doesnot have it. He has no symptoms, and no exposure that he knows of.

## 2019-03-29 NOTE — Telephone Encounter (Signed)
Order has been put in, patient notified he will go to the [facility] location.

## 2019-03-29 NOTE — Telephone Encounter (Signed)
Please arrange for testing at tent. Not sure if too late to go today.  Takes 3-4 days to get results back.

## 2019-03-31 LAB — SPECIMEN STATUS REPORT

## 2019-03-31 LAB — NOVEL CORONAVIRUS, NAA: SARS-CoV-2, NAA: NOT DETECTED

## 2019-04-03 ENCOUNTER — Telehealth: Payer: Self-pay | Admitting: Internal Medicine

## 2019-04-03 NOTE — Telephone Encounter (Signed)
We don't have an appt tomorrow. We are closed maybe late next week

## 2019-04-03 NOTE — Telephone Encounter (Signed)
Jp Eastham [phone]  Purcell Nails called to say since his COVID test was negative he wanted to come in about his tongue feeling like it is burnt for the last month and his taste is off because of it. He would also like a flu shot when he comes in.

## 2019-04-04 NOTE — Telephone Encounter (Signed)
Appointment scheduled.

## 2019-04-12 ENCOUNTER — Ambulatory Visit (INDEPENDENT_AMBULATORY_CARE_PROVIDER_SITE_OTHER): Payer: Medicare Other | Admitting: Internal Medicine

## 2019-04-12 ENCOUNTER — Encounter: Payer: Self-pay | Admitting: Internal Medicine

## 2019-04-12 ENCOUNTER — Other Ambulatory Visit: Payer: Self-pay

## 2019-04-12 VITALS — BP 130/80 | HR 76 | Temp 98.1°F | Ht 64.5 in | Wt 170.0 lb

## 2019-04-12 DIAGNOSIS — Z23 Encounter for immunization: Secondary | ICD-10-CM | POA: Diagnosis not present

## 2019-04-12 DIAGNOSIS — K130 Diseases of lips: Secondary | ICD-10-CM | POA: Diagnosis not present

## 2019-04-27 NOTE — Progress Notes (Signed)
   Subjective:    Patient ID: Colbert Coyer, male    DOB: 11/22/1955, 63 y.o.   MRN: [id number]  HPI 63 year old Male who has issues with his tongue recently.  Says tongue feels irritated.  He has had some dysgeusia and dry mouth.  Has noticed some cracking corners of mouth.  No known COVID-19 exposure.  Seems anxious.  Medications reviewed do not see any medications that would be causing the symptoms.    Review of Systems see above     Objective:   Physical Exam  Tongue is slightly coated and slightly erythematous.  He has findings consistent with cheilitis      Assessment & Plan:  Angular cheilitis-can trial Mycolog cream sparingly in corners of mouth  Glossitis  Plan: [facility] Magic mouthwash 2 teaspoons swish do not swallow 4 times daily for glossitis.  Flu vaccine given.

## 2019-04-27 NOTE — Patient Instructions (Addendum)
Can try Mycolog cream and corners of mouth if necessary twice daily.  [employer] Magic mouthwash 2teaspoons male swish do not swallow 4 times daily for 5 to 7 days.  Reassured patient symptoms should have improved.  Flu vaccine given.

## 2019-05-01 ENCOUNTER — Telehealth: Payer: Self-pay | Admitting: Internal Medicine

## 2019-05-01 NOTE — Telephone Encounter (Signed)
Pt said he is done with the magic mouthwash that Dr Renold Genta prescribed and that his mouth still feels the same after a few weeks. He was wondering what he should do next

## 2019-05-01 NOTE — Telephone Encounter (Signed)
Should check with his dentist or we can refer him to ENT. I saw no pathology except in corners of mouth.

## 2019-05-01 NOTE — Telephone Encounter (Signed)
Left detailed message.   

## 2019-05-21 NOTE — Telephone Encounter (Signed)
Error

## 2019-05-27 ENCOUNTER — Other Ambulatory Visit: Payer: Self-pay

## 2019-05-27 ENCOUNTER — Telehealth: Payer: Self-pay | Admitting: Internal Medicine

## 2019-05-27 DIAGNOSIS — Z20822 Contact with and (suspected) exposure to covid-19: Secondary | ICD-10-CM

## 2019-05-27 NOTE — Telephone Encounter (Signed)
David Cantu [phone]  Purcell Nails called to say he would like to go be tested for COVID, because his granddaughter was born this past Wednesday and they do not want him around her till they are sure he does not have COVID, he has been on several planes, this past Friday morning was the last time on plane.

## 2019-05-27 NOTE — Telephone Encounter (Signed)
Called patient and let him know he can go to [facility] without an order and be tested. He is going to do that.

## 2019-05-27 NOTE — Telephone Encounter (Signed)
Patient may go to tent without an order

## 2019-05-28 ENCOUNTER — Other Ambulatory Visit: Payer: Self-pay | Admitting: Cardiology

## 2019-05-28 DIAGNOSIS — K146 Glossodynia: Secondary | ICD-10-CM | POA: Diagnosis not present

## 2019-05-28 DIAGNOSIS — K117 Disturbances of salivary secretion: Secondary | ICD-10-CM | POA: Diagnosis not present

## 2019-05-28 LAB — NOVEL CORONAVIRUS, NAA: SARS-CoV-2, NAA: NOT DETECTED

## 2019-05-29 ENCOUNTER — Telehealth: Payer: Self-pay | Admitting: Internal Medicine

## 2019-05-29 NOTE — Telephone Encounter (Signed)
Patient called back and scheduled appointment

## 2019-05-29 NOTE — Telephone Encounter (Signed)
Spoke with [name]'s wife and she will have him call back to schedule CPE and fasting labs, we already booking into February.

## 2019-06-20 ENCOUNTER — Other Ambulatory Visit: Payer: Self-pay

## 2019-06-20 DIAGNOSIS — Z20822 Contact with and (suspected) exposure to covid-19: Secondary | ICD-10-CM

## 2019-06-22 LAB — NOVEL CORONAVIRUS, NAA: SARS-CoV-2, NAA: NOT DETECTED

## 2019-07-23 ENCOUNTER — Other Ambulatory Visit: Payer: Self-pay

## 2019-07-23 ENCOUNTER — Ambulatory Visit: Payer: Medicare Other | Attending: Internal Medicine

## 2019-07-23 DIAGNOSIS — Z20828 Contact with and (suspected) exposure to other viral communicable diseases: Secondary | ICD-10-CM | POA: Diagnosis not present

## 2019-07-23 DIAGNOSIS — Z20822 Contact with and (suspected) exposure to covid-19: Secondary | ICD-10-CM

## 2019-07-25 LAB — NOVEL CORONAVIRUS, NAA: SARS-CoV-2, NAA: NOT DETECTED

## 2019-08-21 ENCOUNTER — Ambulatory Visit: Payer: Medicare Other | Attending: Internal Medicine

## 2019-08-21 ENCOUNTER — Ambulatory Visit: Payer: Medicare Other

## 2019-08-21 DIAGNOSIS — Z23 Encounter for immunization: Secondary | ICD-10-CM | POA: Insufficient documentation

## 2019-08-21 NOTE — Progress Notes (Signed)
   Covid-19 Vaccination Clinic  Name:  David Cantu    MRN: [id number] DOB: 1955/08/30  08/21/2019  [name] was observed post Covid-19 immunization for 15 minutes without incidence. He was provided with Vaccine Information Sheet and instruction to access the V-Safe system.   [name] was instructed to call 911 with any severe reactions post vaccine: Marland Kitchen Difficulty breathing  . Swelling of your face and throat  . A fast heartbeat  . A bad rash all over your body  . Dizziness and weakness    Immunizations Administered    Name Date Dose VIS Date Route   Pfizer COVID-19 Vaccine 08/21/2019  5:54 PM 0.3 mL 07/12/2019 Intramuscular   Manufacturer: [employer], [employer]   Lot: [id number]   NDC: [address]-4

## 2019-08-23 ENCOUNTER — Encounter: Payer: Self-pay | Admitting: Cardiology

## 2019-08-23 ENCOUNTER — Ambulatory Visit: Payer: Medicare Other | Admitting: Cardiology

## 2019-08-23 ENCOUNTER — Other Ambulatory Visit: Payer: Self-pay

## 2019-08-23 VITALS — BP 136/70 | HR 67 | Ht 64.5 in | Wt 175.8 lb

## 2019-08-23 DIAGNOSIS — I10 Essential (primary) hypertension: Secondary | ICD-10-CM

## 2019-08-23 DIAGNOSIS — I2583 Coronary atherosclerosis due to lipid rich plaque: Secondary | ICD-10-CM | POA: Diagnosis not present

## 2019-08-23 DIAGNOSIS — E7800 Pure hypercholesterolemia, unspecified: Secondary | ICD-10-CM

## 2019-08-23 DIAGNOSIS — I2510 Atherosclerotic heart disease of native coronary artery without angina pectoris: Secondary | ICD-10-CM
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
## 2019-08-23 NOTE — Patient Instructions (Signed)
Medication Instructions:  The current medical regimen is effective;  continue present plan and medications.  *If you need a refill on your cardiac medications before your next appointment, please call your pharmacy*  Follow-Up: At [facility], you and your health needs are our priority.  As part of our continuing mission to provide you with exceptional heart care, we have created designated Provider Care Teams.  These Care Teams include your primary Cardiologist (physician) and Advanced Practice Providers (APPs -  Physician Assistants and Nurse Practitioners) who all work together to provide you with the care you need, when you need it.  Your next appointment:   12 month(s)  The format for your next appointment:   In Person  Provider:   Mark Skains, MD   Thank you for choosing [facility]!!     

## 2019-08-26 ENCOUNTER — Other Ambulatory Visit: Payer: Self-pay | Admitting: Cardiology

## 2019-09-03 ENCOUNTER — Other Ambulatory Visit: Payer: Medicare Other | Admitting: Internal Medicine

## 2019-09-03 ENCOUNTER — Other Ambulatory Visit: Payer: Self-pay

## 2019-09-03 DIAGNOSIS — Z0000 Encounter for general adult medical examination without abnormal findings: Secondary | ICD-10-CM

## 2019-09-03 DIAGNOSIS — E781 Pure hyperglyceridemia: Secondary | ICD-10-CM | POA: Diagnosis not present

## 2019-09-03 DIAGNOSIS — R7302 Impaired glucose tolerance (oral): Secondary | ICD-10-CM

## 2019-09-03 DIAGNOSIS — K859 Acute pancreatitis without necrosis or infection, unspecified: Secondary | ICD-10-CM | POA: Diagnosis not present

## 2019-09-03 DIAGNOSIS — I10 Essential (primary) hypertension: Secondary | ICD-10-CM | POA: Diagnosis not present

## 2019-09-03 DIAGNOSIS — E785 Hyperlipidemia, unspecified: Secondary | ICD-10-CM

## 2019-09-04 LAB — COMPLETE METABOLIC PANEL WITH GFR
AG Ratio: 1.8 (calc) (ref 1.0–2.5)
ALT: 35 U/L (ref 9–46)
AST: 22 U/L (ref 10–35)
Albumin: 4.7 g/dL (ref 3.6–5.1)
Alkaline phosphatase (APISO): 56 U/L (ref 35–144)
BUN/Creatinine Ratio: 12 (calc) (ref 6–22)
BUN: 15 mg/dL (ref 7–25)
CO2: 28 mmol/L (ref 20–32)
Calcium: 9.8 mg/dL (ref 8.6–10.3)
Chloride: 100 mmol/L (ref 98–110)
Creat: 1.28 mg/dL — ABNORMAL HIGH (ref 0.70–1.25)
GFR, Est African American: 68 mL/min/{1.73_m2} (ref >=60)
GFR, Est Non African American: 58 mL/min/{1.73_m2} — ABNORMAL LOW (ref >=60)
Globulin: 2.6 g/dL (calc) (ref 1.9–3.7)
Glucose, Bld: 105 mg/dL — ABNORMAL HIGH (ref 65–99)
Potassium: 4.6 mmol/L (ref 3.5–5.3)
Sodium: 139 mmol/L (ref 135–146)
Total Bilirubin: 0.7 mg/dL (ref 0.2–1.2)
Total Protein: 7.3 g/dL (ref 6.1–8.1)

## 2019-09-04 LAB — CBC WITH DIFFERENTIAL/PLATELET
Absolute Monocytes: 527 cells/uL (ref 200–950)
Basophils Absolute: 32 cells/uL (ref 0–200)
Basophils Relative: 0.4 %
Eosinophils Absolute: 656 cells/uL — ABNORMAL HIGH (ref 15–500)
Eosinophils Relative: 8.1 %
HCT: 42.2 % (ref 38.5–50.0)
Hemoglobin: 14.7 g/dL (ref 13.2–17.1)
Lymphs Abs: 1264 cells/uL (ref 850–3900)
MCH: 29.7 pg (ref 27.0–33.0)
MCHC: 34.8 g/dL (ref 32.0–36.0)
MCV: 85.3 fL (ref 80.0–100.0)
MPV: 10.4 fL (ref 7.5–12.5)
Monocytes Relative: 6.5 %
Neutro Abs: 5621 cells/uL (ref 1500–7800)
Neutrophils Relative %: 69.4 %
Platelets: 240 10*3/uL (ref 140–400)
RBC: 4.95 10*6/uL (ref 4.20–5.80)
RDW: 12.6 % (ref 11.0–15.0)
Total Lymphocyte: 15.6 %
WBC: 8.1 10*3/uL (ref 3.8–10.8)

## 2019-09-04 LAB — HEMOGLOBIN A1C
Hgb A1c MFr Bld: 6.1 % of total Hgb — ABNORMAL HIGH (ref <5.7)
Mean Plasma Glucose: 128 (calc)
eAG (mmol/L): 7.1 (calc)

## 2019-09-04 LAB — LIPID PANEL
Cholesterol: 192 mg/dL (ref <200)
HDL: 38 mg/dL — ABNORMAL LOW (ref >=40)
LDL Cholesterol (Calc): 115 mg/dL (calc) — ABNORMAL HIGH
Non-HDL Cholesterol (Calc): 154 mg/dL (calc) — ABNORMAL HIGH (ref <130)
Total CHOL/HDL Ratio: 5.1 (calc) — ABNORMAL HIGH (ref <5.0)
Triglycerides: 294 mg/dL — ABNORMAL HIGH (ref <150)

## 2019-09-04 LAB — PSA: PSA: 1.3 ng/mL (ref <=4.0)

## 2019-09-06 ENCOUNTER — Ambulatory Visit (INDEPENDENT_AMBULATORY_CARE_PROVIDER_SITE_OTHER): Payer: Medicare Other | Admitting: Internal Medicine

## 2019-09-06 ENCOUNTER — Other Ambulatory Visit: Payer: Self-pay

## 2019-09-06 ENCOUNTER — Encounter: Payer: Self-pay | Admitting: Internal Medicine

## 2019-09-06 VITALS — BP 150/88 | HR 65 | Temp 98.0°F | Ht 64.5 in | Wt 175.0 lb

## 2019-09-06 DIAGNOSIS — E781 Pure hyperglyceridemia: Secondary | ICD-10-CM

## 2019-09-06 DIAGNOSIS — K219 Gastro-esophageal reflux disease without esophagitis: Secondary | ICD-10-CM

## 2019-09-06 DIAGNOSIS — I10 Essential (primary) hypertension: Secondary | ICD-10-CM

## 2019-09-06 DIAGNOSIS — K859 Acute pancreatitis without necrosis or infection, unspecified: Secondary | ICD-10-CM | POA: Diagnosis not present

## 2019-09-06 DIAGNOSIS — E786 Lipoprotein deficiency: Secondary | ICD-10-CM | POA: Diagnosis not present

## 2019-09-06 DIAGNOSIS — Z0000 Encounter for general adult medical examination without abnormal findings: Secondary | ICD-10-CM

## 2019-09-06 DIAGNOSIS — Z955 Presence of coronary angioplasty implant and graft: Secondary | ICD-10-CM

## 2019-09-06 LAB — POCT URINALYSIS DIPSTICK
Appearance: NEGATIVE
Bilirubin, UA: NEGATIVE
Blood, UA: NEGATIVE
Glucose, UA: NEGATIVE
Ketones, UA: NEGATIVE
Leukocytes, UA: NEGATIVE
Nitrite, UA: NEGATIVE
Odor: NEGATIVE
Protein, UA: NEGATIVE
Spec Grav, UA: 1.015 (ref 1.010–1.025)
Urobilinogen, UA: 0.2 E.U./dL
pH, UA: 6.5 (ref 5.0–8.0)
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
## 2019-09-10 ENCOUNTER — Ambulatory Visit: Payer: Medicare Other | Attending: Internal Medicine

## 2019-09-10 DIAGNOSIS — Z23 Encounter for immunization: Secondary | ICD-10-CM | POA: Insufficient documentation

## 2019-09-10 NOTE — Progress Notes (Signed)
   Covid-19 Vaccination Clinic  Name:  Hawken Bielby    MRN: [id number] DOB: 19-Apr-1956  09/10/2019  Mr. Pavlik was observed post Covid-19 immunization for 15 minutes without incidence. He was provided with Vaccine Information Sheet and instruction to access the V-Safe system.   Mr. Tuman was instructed to call 911 with any severe reactions post vaccine: Marland Kitchen Difficulty breathing  . Swelling of your face and throat  . A fast heartbeat  . A bad rash all over your body  . Dizziness and weakness    Immunizations Administered    Name Date Dose VIS Date Route   Pfizer COVID-19 Vaccine 09/10/2019  8:48 AM 0.3 mL 07/12/2019 Intramuscular   Manufacturer: [employer], [employer]   Lot: [id number]   NDC: [address]-8

## 2019-10-03 ENCOUNTER — Telehealth: Payer: Self-pay | Admitting: *Deleted

## 2019-10-03 DIAGNOSIS — E7800 Pure hypercholesterolemia, unspecified: Secondary | ICD-10-CM

## 2019-10-03 DIAGNOSIS — Z79899 Other long term (current) drug therapy: Secondary | ICD-10-CM

## 2019-10-03 MED ORDER — ROSUVASTATIN CALCIUM 40 MG PO TABS: 40.0000 mg | ORAL_TABLET | Freq: Every day | ORAL | 3 refills | Status: DC

## 2019-10-03 NOTE — Telephone Encounter (Signed)
LDL 157 Stop atorvastatin 80 Start Crestor 40mg  PO QD Repeat lipids in 3 months with ALT  Thanks , MD

## 2019-10-03 NOTE — Telephone Encounter (Signed)
David Cantu [address]"  David Bathe, MD 15 minutes ago (9:22 AM)   I use [facility] in [place] [address]  Phone [phone] 90 day supply.  And yes thank you please schedule the appointment for the test. [facility]   You  David Cantu "[name]" 1 hour ago (8:23 AM)   Please see the information below from Dr Anne Fu:  7:21 AM Note   LDL 157 Stop atorvastatin 80 Start Crestor 40mg  PO QD Repeat lipids in 3 months with ALT  Thanks , MD      Which pharmacy would you like the new medication sent to?  Would you like a 30 day or 90 day supply?  Will you have repeat lab in 3 months here (I can schedule) or at Dr Donato Schultz office?  Thank you,  Pam, RN for Dr Beryle Quant       LDL 157 Stop atorvastatin 80 Start Crestor 40mg  PO QD Repeat lipids in 3 months with ALT  Thanks Anne Fu, MD       Documentation   Hello Dr , When you saw me last you said you might need to change my meds if my LDL was more than 70. I had my bloodwork results sent to your office from Dr Donato Schultz office. Can you look and see if a change is needed. Thank you David Cantu 12/04/1955

## 2019-10-19 NOTE — Patient Instructions (Signed)
Follow-up regarding hyperlipidemia with cardiologist soon.  Need Prevnar 13 a few weeks after your second dose of COVID-19 vaccine.  Watch diet.  Try to lose weight.

## 2019-11-05 ENCOUNTER — Telehealth: Payer: Self-pay | Admitting: Internal Medicine

## 2019-11-05 DIAGNOSIS — K863 Pseudocyst of pancreas: Secondary | ICD-10-CM

## 2019-11-06 NOTE — Telephone Encounter (Signed)
Left message for patient to call back  

## 2019-11-07 NOTE — Addendum Note (Signed)
Addended by: Annett Fabian on: 11/07/2019 03:31 PM   Modules accepted: Orders

## 2019-11-07 NOTE — Telephone Encounter (Signed)
Left message for patient to call back  Patient is scheduled for MRI/MRCP at [facility] for 12/04/19 11:30 arrival

## 2019-11-08 NOTE — Telephone Encounter (Signed)
Left message for patient to call back  

## 2019-11-11 ENCOUNTER — Telehealth: Payer: Self-pay | Admitting: Internal Medicine

## 2019-11-11 DIAGNOSIS — Z03818 Encounter for observation for suspected exposure to other biological agents ruled out: Secondary | ICD-10-CM | POA: Diagnosis not present

## 2019-11-11 NOTE — Telephone Encounter (Signed)
See other phone notes for details.  

## 2019-11-11 NOTE — Telephone Encounter (Signed)
Patient notified of the appt dates and times.   

## 2019-11-28 DIAGNOSIS — Z0120 Encounter for dental examination and cleaning without abnormal findings: Secondary | ICD-10-CM | POA: Diagnosis not present

## 2019-12-04 ENCOUNTER — Other Ambulatory Visit: Payer: Self-pay | Admitting: Internal Medicine

## 2019-12-04 ENCOUNTER — Encounter (HOSPITAL_COMMUNITY): Payer: Self-pay

## 2019-12-04 ENCOUNTER — Other Ambulatory Visit: Payer: Self-pay

## 2019-12-04 ENCOUNTER — Ambulatory Visit (HOSPITAL_COMMUNITY): Payer: Medicare Other

## 2019-12-04 ENCOUNTER — Ambulatory Visit (HOSPITAL_COMMUNITY)
Admission: RE | Admit: 2019-12-04 | Discharge: 2019-12-04 | Disposition: A | Discharge status: Home / Self Care | Payer: Medicare Other | Source: Ambulatory Visit | Attending: Internal Medicine | Admitting: Internal Medicine

## 2019-12-04 DIAGNOSIS — K863 Pseudocyst of pancreas: Secondary | ICD-10-CM

## 2019-12-09 DIAGNOSIS — H524 Presbyopia: Secondary | ICD-10-CM | POA: Diagnosis not present

## 2019-12-09 DIAGNOSIS — H43812 Vitreous degeneration, left eye: Secondary | ICD-10-CM | POA: Diagnosis not present

## 2019-12-14 DIAGNOSIS — Z20828 Contact with and (suspected) exposure to other viral communicable diseases: Secondary | ICD-10-CM | POA: Diagnosis not present

## 2019-12-14 DIAGNOSIS — Z20822 Contact with and (suspected) exposure to covid-19: Secondary | ICD-10-CM | POA: Diagnosis not present

## 2019-12-14 DIAGNOSIS — Z03818 Encounter for observation for suspected exposure to other biological agents ruled out: Secondary | ICD-10-CM | POA: Diagnosis not present

## 2019-12-16 ENCOUNTER — Ambulatory Visit (HOSPITAL_COMMUNITY)
Admission: RE | Admit: 2019-12-16 | Discharge: 2019-12-16 | Disposition: A | Discharge status: Home / Self Care | Payer: Medicare Other | Source: Ambulatory Visit | Attending: Internal Medicine | Admitting: Internal Medicine

## 2019-12-16 ENCOUNTER — Other Ambulatory Visit: Payer: Self-pay

## 2019-12-16 DIAGNOSIS — R932 Abnormal findings on diagnostic imaging of liver and biliary tract: Secondary | ICD-10-CM | POA: Diagnosis not present

## 2019-12-16 DIAGNOSIS — K863 Pseudocyst of pancreas: Secondary | ICD-10-CM | POA: Diagnosis not present

## 2019-12-16 DIAGNOSIS — K862 Cyst of pancreas: Secondary | ICD-10-CM | POA: Diagnosis not present

## 2019-12-16 DIAGNOSIS — R935 Abnormal findings on diagnostic imaging of other abdominal regions, including retroperitoneum: Secondary | ICD-10-CM | POA: Diagnosis not present

## 2019-12-16 MED ORDER — GADOBUTROL 1 MMOL/ML IV SOLN
8.0000 mL | Freq: Once | INTRAVENOUS | Status: AC | PRN
  Administered 2019-12-16: 8 mL via INTRAVENOUS

## 2019-12-16 NOTE — Progress Notes (Signed)
David Cantu,  The tiny cyst in the pancreas remains tiny. That is good news as things like cancer grow and get bigger.  The radiologist has suggested repeating an MRI in 2 years - I am not certain that is necessary based upon 3 years of no change in size.  I suggest that we talk about this in 2 years - so I will have my RN place a reminder in our system called a recall for an office visit in 2 years to discuss.  Please let me know if any ?'s  Best regards,  Iva Boop, MD, [facility]

## 2020-01-03 ENCOUNTER — Other Ambulatory Visit: Payer: Medicare Other | Admitting: *Deleted

## 2020-01-03 ENCOUNTER — Other Ambulatory Visit: Payer: Self-pay

## 2020-01-03 DIAGNOSIS — E7800 Pure hypercholesterolemia, unspecified: Secondary | ICD-10-CM

## 2020-01-03 DIAGNOSIS — Z79899 Other long term (current) drug therapy: Secondary | ICD-10-CM

## 2020-01-03 LAB — LIPID PANEL
Chol/HDL Ratio: 3.7 ratio (ref 0.0–5.0)
Cholesterol, Total: 157 mg/dL (ref 100–199)
HDL: 43 mg/dL (ref >39)
LDL Chol Calc (NIH): 83 mg/dL (ref 0–99)
Triglycerides: 185 mg/dL — ABNORMAL HIGH (ref 0–149)
VLDL Cholesterol Cal: 31 mg/dL (ref 5–40)

## 2020-01-03 LAB — ALT: ALT: 27 IU/L (ref 0–44)

## 2020-01-07 ENCOUNTER — Telehealth: Payer: Self-pay

## 2020-01-07 DIAGNOSIS — E785 Hyperlipidemia, unspecified: Secondary | ICD-10-CM

## 2020-01-07 DIAGNOSIS — Z79899 Other long term (current) drug therapy: Secondary | ICD-10-CM

## 2020-01-07 MED ORDER — EZETIMIBE 10 MG PO TABS
10.0000 mg | ORAL_TABLET | Freq: Every day | ORAL | 3 refills | Status: DC
Start: 2020-01-07 — End: 2020-12-22

## 2020-01-07 NOTE — Addendum Note (Signed)
Addended by: Sigurd Sos on: 01/07/2020 09:22 AM   Modules accepted: Orders

## 2020-01-07 NOTE — Telephone Encounter (Signed)
The patient has been notified of the lab result and verbalized understanding.  All questions (if any) were answered. Sigurd Sos, RN 01/07/2020 9:19 AM   Patient will start Zetia 10 mg Daily and have Lipid Panel 04/08/20

## 2020-01-07 NOTE — Telephone Encounter (Signed)
lpmtcb labs 6/8

## 2020-01-07 NOTE — Telephone Encounter (Signed)
-----   Message from Jake Bathe, MD sent at 01/07/2020  7:07 AM EDT ----- LDL 83, improved but goal is <70.  On new change Crestor 40 from atorva 80. Please add Zetia 10mg  PO QD and repeat lipid panel in 3 months. , MD

## 2020-01-07 NOTE — Telephone Encounter (Signed)
Patient returning call.

## 2020-02-22 ENCOUNTER — Other Ambulatory Visit: Payer: Self-pay | Admitting: Cardiology

## 2020-04-03 ENCOUNTER — Other Ambulatory Visit: Payer: Medicare Other | Admitting: *Deleted

## 2020-04-03 ENCOUNTER — Other Ambulatory Visit: Payer: Self-pay

## 2020-04-03 DIAGNOSIS — Z79899 Other long term (current) drug therapy: Secondary | ICD-10-CM

## 2020-04-03 DIAGNOSIS — E785 Hyperlipidemia, unspecified: Secondary | ICD-10-CM | POA: Diagnosis not present

## 2020-04-03 LAB — LIPID PANEL
Chol/HDL Ratio: 2.8 ratio (ref 0.0–5.0)
Cholesterol, Total: 122 mg/dL (ref 100–199)
HDL: 43 mg/dL (ref >39)
LDL Chol Calc (NIH): 55 mg/dL (ref 0–99)
Triglycerides: 137 mg/dL (ref 0–149)
VLDL Cholesterol Cal: 24 mg/dL (ref 5–40)

## 2020-04-08 ENCOUNTER — Other Ambulatory Visit: Payer: Medicare Other

## 2020-05-08 ENCOUNTER — Telehealth: Payer: Self-pay | Admitting: Internal Medicine

## 2020-05-08 NOTE — Telephone Encounter (Signed)
David Cantu [phone]  [name] called to say he is having some pains in the back of his head more to the top, feels more like strong pressure been going on for couple of months. It comes and goes, does not last very long, not sure if it may be stress, muscle spasms or something else. Would like to come in sometime next week and talk to you about it.

## 2020-05-08 NOTE — Telephone Encounter (Signed)
scheduled

## 2020-05-08 NOTE — Telephone Encounter (Signed)
OK 

## 2020-05-12 ENCOUNTER — Encounter: Payer: Self-pay | Admitting: Internal Medicine

## 2020-05-12 ENCOUNTER — Ambulatory Visit (INDEPENDENT_AMBULATORY_CARE_PROVIDER_SITE_OTHER): Payer: Medicare Other | Admitting: Internal Medicine

## 2020-05-12 ENCOUNTER — Other Ambulatory Visit: Payer: Self-pay

## 2020-05-12 VITALS — BP 130/68 | HR 60 | Ht 64.5 in | Wt 174.0 lb

## 2020-05-12 DIAGNOSIS — E781 Pure hyperglyceridemia: Secondary | ICD-10-CM

## 2020-05-12 DIAGNOSIS — Z955 Presence of coronary angioplasty implant and graft: Secondary | ICD-10-CM

## 2020-05-12 DIAGNOSIS — R202 Paresthesia of skin: Secondary | ICD-10-CM | POA: Diagnosis not present

## 2020-05-12 DIAGNOSIS — I10 Essential (primary) hypertension: Secondary | ICD-10-CM

## 2020-05-12 NOTE — Patient Instructions (Signed)
Referral to Neurology as patient wants to make sure he does not have a brain tumor. It is unlikely [employer] will allow me approval to do CT of brain with normal exam. He has some mild forgetfulness that concerns him as well.

## 2020-05-12 NOTE — Progress Notes (Signed)
   Subjective:    Patient ID: David Cantu, male    DOB: [birth year], 64 y.o.   MRN: [id number]  HPI 64 year old White Male presented to this office for the 1st time in 2017.  It was noted he had history of recurrent pancreatitis and last episode was February 2018.  History of significant hypertriglyceridemia.  Currently is on statin for this.  History of PCI to the right coronary artery and placement of drug-eluting stent in 2016 and is followed by [facility], Dr. Anne Fu.  New complaints today are issues with his memory and an odd sensation from time to time in his right parietal area that is concerning to him.  He does not describe this as a frank headache but some sort of odd paresthesia that is concerning to him.  He is also concerned that he has become more forgetful recently and is worried about his memory and wants to be tested for possible early dementia. I think he has an element of anxiety to his complaints today.  He does not think so.  Please see dictation September 06, 2019 for past medical history.  Has been hypertensive since 1997.  He moved here from [place] around 2007.  Says he walks daily.  Social history: He is married.  Does not smoke.  Social alcohol option.  Family history: No history of memory disorder.  No family  history of brain tumor.  Mother died age 5 of a stroke.  He has 3 adult children.  Father died at age 22 of lung cancer.  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  Current medications include Zetia, Coreg, Zestril, Protonix, Crestor.  Review of Systems he has no history of nausea/ vomiting or head trauma.  Sensation in the right parietal area does not seem to have any rhyme or reason as to onset or to what makes it go away.  Does not seem to be related to specific food intake  Which could be a trigger for migraine headache.  Denies significant anxiety or stress at this point in time.     Objective:    Physical Exam Blood pressure 130/68 pulse 60 pulse oximetry 97% weight 174 pounds He has no obvious skeletal deformity.  PERRLA.  Extraocular movements are full.  No facial weakness or asymmetry.  Muscle strength in the upper and lower extremities is within normal limits.  Cranial nerves II through XII are grossly intact.  Deep tendon reflexes are symmetrical. There are no focal deficits on brief neurological exam.  Affect and judgment are normal. He is somewhat slow to remember 3 items after 5 minutes.  However, he is alert and oriented x3.     Assessment & Plan:  Paresthesia right parietal area-etiology unclear.?  Anxiety.  Patient denies anxiety.  He wants to know there is nothing wrong with his brain.  Therefore he will be referred to Neurology.  ?  Mild memory loss-refer to Neurology and possibly to Neuropsychology for evaluation.  History of coronary disease  History of pancreatitis  ?  Anxiety-patient denies this  Plan: He will be referred to Neurology for evaluation  of paresthesias of right parietal area.  ?  Mild memory loss-suggested he consider Neuropsychology testing for memory disorder.  Plan: Return as needed.  Referral to Neurology for evaluation.

## 2020-05-21 ENCOUNTER — Encounter: Payer: Self-pay | Admitting: Neurology

## 2020-05-21 DIAGNOSIS — Z20822 Contact with and (suspected) exposure to covid-19: Secondary | ICD-10-CM | POA: Diagnosis not present

## 2020-05-21 DIAGNOSIS — Z03818 Encounter for observation for suspected exposure to other biological agents ruled out: Secondary | ICD-10-CM | POA: Diagnosis not present

## 2020-06-16 DIAGNOSIS — Z0120 Encounter for dental examination and cleaning without abnormal findings: Secondary | ICD-10-CM | POA: Diagnosis not present

## 2020-06-19 ENCOUNTER — Ambulatory Visit: Payer: Medicare Other | Admitting: Neurology

## 2020-06-19 ENCOUNTER — Other Ambulatory Visit: Payer: Self-pay

## 2020-06-19 ENCOUNTER — Encounter: Payer: Self-pay | Admitting: Neurology

## 2020-06-19 VITALS — BP 147/102 | HR 67 | Resp 20 | Ht 66.0 in | Wt 179.0 lb

## 2020-06-19 DIAGNOSIS — R519 Headache, unspecified: Secondary | ICD-10-CM

## 2020-06-19 NOTE — Patient Instructions (Signed)
Possibly these are "nummular headaches" although they typically do not last this short duration.  We will rule out other causes, so we will get MRI and MRA of brain.  Follow up afterwards.  If needed, we can always start a medication such as gabapentin.

## 2020-06-19 NOTE — Progress Notes (Signed)
NEUROLOGY CONSULTATION NOTE  David Cantu MRN: [id number] DOB: [birth year]  Referring provider: Luanna Cole. Lenord Fellers, MD Primary care provider: Luanna Cole. Lenord Fellers, MD  Reason for consult:  headache   Subjective:  David Cantu is a 64 year old right-handed male who presents for headache.  History supplemented by referring provider's note.  In the beginning of the year, started getting right upper occipital pain (around the crown), described as a cramp that builds up until he felt he was being pushed in the back of his head.  Lasts 30 seconds.  Sometimes may occur in the left side as well.  Started occurring once a week, and then became more frequent until it was occurring twice a day.  It happens at any time, whether he is driving, watching TV or any other activity.  No associated numbness, visual disturbance, nausea, photophobia, phonophobia.  He may note some suboccipital tightness but no real neck pain and no pain or numbness into the upper extremities.     He has a remote history of migraines but not for many years.  He has never had headaches like this before.   PAST MEDICAL HISTORY: Past Medical History:  Diagnosis Date  . Acute pancreatitis 02/03/2014; 05/12/2016  . Arthritis    "shoulders" (05/12/2016)  . CAD (coronary artery disease)    a. L&R cath 07/09/15 normal LV function, no MR or AS, normal R heart pressure, No pulmonary HTN, 70% RCA s/p DES  . PIRJJOAC(166.0)    "couple times/month" (05/12/2016)  . Heart murmur   . Hypertension   . Migraine    "might have 5-6/year" (05/12/2016)  . Pneumonia 1960; ~ 1992  . Pre-diabetes   . Ringing in the ears    "continuous ringing in my ears" (05/12/2016)  . Shingles     PAST SURGICAL HISTORY: Past Surgical History:  Procedure Laterality Date  . CARDIAC CATHETERIZATION N/A 07/09/2015   Procedure: Right/Left Heart Cath and Coronary Angiography;  Surgeon: Jake Bathe, MD;  [facility] LAB;  Service:  Cardiovascular;  Laterality: N/A;  . CARDIAC CATHETERIZATION N/A 07/09/2015   Procedure: Intravascular Pressure Wire/FFR Study;  Surgeon: Kathleene Hazel, MD;  [facility] CV LAB;  Service: Cardiovascular;  Laterality: N/A;  . CARDIAC CATHETERIZATION N/A 07/09/2015   Procedure: Coronary Stent Intervention;  Surgeon: Kathleene Hazel, MD;  Location: [facility] INVASIVE CV LAB;  Service: Cardiovascular;  Laterality: N/A;  Distal RCA  . SHOULDER ARTHROSCOPY W/ ROTATOR CUFF REPAIR Left 2011  . TRACHEOSTOMY  1960  . TRACHEOSTOMY CLOSURE  1960    MEDICATIONS: Current Outpatient Medications on File Prior to Visit  Medication Sig Dispense Refill  . acetaminophen (TYLENOL) 325 MG tablet Take 325-650 mg by mouth at bedtime as needed (for shoulder pain).     Marland Kitchen aspirin 81 MG chewable tablet Chew 1 tablet (81 mg total) by mouth daily. 30 tablet 11  . carvedilol (COREG) 6.25 MG tablet TAKE 1 TABLET(6.25 MG) BY MOUTH TWICE DAILY WITH A MEAL 180 tablet 1  . ezetimibe (ZETIA) 10 MG tablet Take 1 tablet (10 mg total) by mouth daily. 90 tablet 3  . lisinopril (ZESTRIL) 10 MG tablet TAKE 1 TABLET BY MOUTH DAILY 90 tablet 3  . pantoprazole (PROTONIX) 40 MG tablet TAKE 1 TABLET(40 MG) BY MOUTH DAILY 90 tablet 3  . rosuvastatin (CRESTOR) 40 MG tablet Take 1 tablet (40 mg total) by mouth daily. 90 tablet 3  . nitroGLYCERIN (NITROSTAT) 0.4 MG SL tablet Place 1 tablet (  0.4 mg total) under the tongue every 5 (five) minutes as needed for chest pain. (Patient not taking: Reported on 06/19/2020) 25 tablet 3   No current facility-administered medications on file prior to visit.    ALLERGIES: Allergies  Allergen Reactions  . Zenpep [Pancrelipase (Lip-Prot-Amyl)] Nausea And Vomiting    VIOLENT VOMITING    FAMILY HISTORY: Family History  Problem Relation Age of Onset  . Lung cancer Mother   . Lung cancer Father   . Colon cancer Neg Hx    SOCIAL HISTORY: Social History   Socioeconomic History  .  Marital status: Married    Spouse name: Not on file  . Number of children: 0  . Years of education: Not on file  . Highest education level: Not on file  Occupational History  . Occupation: Self employed  Tobacco Use  . Smoking status: Never Smoker  . Smokeless tobacco: Never Used  Vaping Use  . Vaping Use: Never used  Substance and Sexual Activity  . Alcohol use: Yes    Alcohol/week: 0.0 standard drinks    Comment: 05/12/2016 "might have a few drinks/month"  . Drug use: No  . Sexual activity: Yes  Other Topics Concern  . Not on file  Social History Narrative   Right handed   Drinks caffeine   Two story home   Social Determinants of Health   Financial Resource Strain:   . Difficulty of Paying Living Expenses: Not on file  Food Insecurity:   . Worried About Programme researcher, broadcasting/film/video in the Last Year: Not on file  . Ran Out of Food in the Last Year: Not on file  Transportation Needs:   . Lack of Transportation (Medical): Not on file  . Lack of Transportation (Non-Medical): Not on file  Physical Activity:   . Days of Exercise per Week: Not on file  . Minutes of Exercise per Session: Not on file  Stress:   . Feeling of Stress : Not on file  Social Connections:   . Frequency of Communication with Friends and Family: Not on file  . Frequency of Social Gatherings with Friends and Family: Not on file  . Attends Religious Services: Not on file  . Active Member of Clubs or Organizations: Not on file  . Attends Banker Meetings: Not on file  . Marital Status: Not on file  Intimate Partner Violence:   . Fear of Current or Ex-Partner: Not on file  . Emotionally Abused: Not on file  . Physically Abused: Not on file  . Sexually Abused: Not on file    Objective:  Blood pressure (!) 147/102, pulse 67, resp. rate 20, height 5\' 6"  (1.676 m), weight 179 lb (81.2 kg), SpO2 97 %. General: No acute distress.  Patient appears well-groomed.   Head:   Normocephalic/atraumatic Eyes:  fundi examined but not visualized Neck: supple, no paraspinal tenderness, full range of motion Back: No paraspinal tenderness Heart: regular rate and rhythm Lungs: Clear to auscultation bilaterally. Vascular: No carotid bruits. Neurological Exam: Mental status: alert and oriented to person, place, and time, recent and remote memory intact, fund of knowledge intact, attention and concentration intact, speech fluent and not dysarthric, language intact. Cranial nerves: CN I: not tested CN II: pupils equal, round and reactive to light, visual fields intact CN III, IV, VI:  full range of motion, no nystagmus, no ptosis CN V: facial sensation intact. CN VII: upper and lower face symmetric CN VIII: hearing intact CN IX, X: gag  intact, uvula midline CN XI: sternocleidomastoid and trapezius muscles intact CN XII: tongue midline Bulk & Tone: normal, no fasciculations. Motor:  muscle strength 5/5 throughout Sensation:  Pinprick, temperature and vibratory sensation intact. Deep Tendon Reflexes:  2+ throughout,  toes downgoing.   Finger to nose testing:  Without dysmetria.   Heel to shin:  Without dysmetria.   Gait:  Normal station and stride.  Romberg negative.  Assessment/Plan:   1.  Severe small circumscribed headache around his crow/upper occipital region.  May be nummular headaches (although they are not typically this brief), but secondary intracranial etiology such as mass lesion or aneurysm, should be ruled out.  The pain is too high to be occipital neuralgia.  1.  MRI and MRA of brain.  Further recommendations pending results. 2.  If he feels he may need treatment, consider gabapentin. 3.  Follow up after testing.  Thank you for allowing me to take part in the care of this patient.  [name], DO  CC: Luanna Cole. Lenord Fellers, MD

## 2020-06-20 ENCOUNTER — Ambulatory Visit
Admission: RE | Admit: 2020-06-20 | Discharge: 2020-06-20 | Disposition: A | Discharge status: Home / Self Care | Payer: Medicare Other | Source: Ambulatory Visit | Attending: Neurology | Admitting: Neurology

## 2020-06-20 DIAGNOSIS — R519 Headache, unspecified: Secondary | ICD-10-CM

## 2020-06-22 NOTE — Progress Notes (Signed)
Pt advised of his MRI Results.

## 2020-07-22 ENCOUNTER — Ambulatory Visit: Payer: Medicare Other | Admitting: Neurology

## 2020-08-06 DIAGNOSIS — Z1152 Encounter for screening for COVID-19: Secondary | ICD-10-CM | POA: Diagnosis not present

## 2020-08-06 DIAGNOSIS — Z03818 Encounter for observation for suspected exposure to other biological agents ruled out: Secondary | ICD-10-CM | POA: Diagnosis not present

## 2020-08-21 ENCOUNTER — Other Ambulatory Visit: Payer: Self-pay | Admitting: Cardiology

## 2020-08-24 ENCOUNTER — Other Ambulatory Visit: Payer: Self-pay

## 2020-08-24 ENCOUNTER — Encounter: Payer: Self-pay | Admitting: Cardiology

## 2020-08-24 ENCOUNTER — Telehealth (INDEPENDENT_AMBULATORY_CARE_PROVIDER_SITE_OTHER): Payer: Medicare Other | Admitting: Cardiology

## 2020-08-24 VITALS — BP 148/70 | HR 73 | Ht 66.0 in | Wt 175.0 lb

## 2020-08-24 DIAGNOSIS — I2583 Coronary atherosclerosis due to lipid rich plaque: Secondary | ICD-10-CM | POA: Diagnosis not present

## 2020-08-24 DIAGNOSIS — I2510 Atherosclerotic heart disease of native coronary artery without angina pectoris: Secondary | ICD-10-CM | POA: Diagnosis not present

## 2020-08-24 DIAGNOSIS — I10 Essential (primary) hypertension: Secondary | ICD-10-CM | POA: Diagnosis not present

## 2020-08-24 MED ORDER — ROSUVASTATIN CALCIUM 40 MG PO TABS: 40.0000 mg | ORAL_TABLET | Freq: Every day | ORAL | 3 refills | Status: DC

## 2020-08-24 MED ORDER — NITROGLYCERIN 0.4 MG SL SUBL: 0.4000 mg | SUBLINGUAL_TABLET | SUBLINGUAL | 3 refills | Status: DC | PRN

## 2020-08-24 MED ORDER — CARVEDILOL 6.25 MG PO TABS: ORAL_TABLET | ORAL | 3 refills | Status: DC

## 2020-08-24 MED ORDER — NITROGLYCERIN 0.4 MG SL SUBL: 0.4000 mg | SUBLINGUAL_TABLET | SUBLINGUAL | 3 refills | Status: AC | PRN

## 2020-08-24 MED ORDER — LISINOPRIL 10 MG PO TABS: 10.0000 mg | ORAL_TABLET | Freq: Every day | ORAL | 3 refills | Status: DC

## 2020-08-24 NOTE — Addendum Note (Signed)
Addended by: Sharin Grave on: 08/24/2020 11:52 AM   Modules accepted: Orders

## 2020-08-24 NOTE — Patient Instructions (Signed)
Medication Instructions:  The current medical regimen is effective;  continue present plan and medications.  *If you need a refill on your cardiac medications before your next appointment, please call your pharmacy*  Follow-Up: At [facility], you and your health needs are our priority.  As part of our continuing mission to provide you with exceptional heart care, we have created designated Provider Care Teams.  These Care Teams include your primary Cardiologist (physician) and Advanced Practice Providers (APPs -  Physician Assistants and Nurse Practitioners) who all work together to provide you with the care you need, when you need it.  We recommend signing up for the patient portal called "MyChart".  Sign up information is provided on this After Visit Summary.  MyChart is used to connect with patients for Virtual Visits (Telemedicine).  Patients are able to view lab/test results, encounter notes, upcoming appointments, etc.  Non-urgent messages can be sent to your provider as well.   To learn more about what you can do with MyChart, go to [email or web address].    Your next appointment:   12 month(s)  The format for your next appointment:   In Person  Provider:   Mark Skains, MD   Thank you for choosing [facility]!!      
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
## 2020-08-26 ENCOUNTER — Telehealth: Payer: Self-pay | Admitting: Cardiology

## 2020-08-26 ENCOUNTER — Other Ambulatory Visit: Payer: Self-pay | Admitting: Cardiology

## 2020-08-26 MED ORDER — LISINOPRIL 10 MG PO TABS: 10.0000 mg | ORAL_TABLET | Freq: Every day | ORAL | 3 refills | Status: DC

## 2020-08-26 MED ORDER — PANTOPRAZOLE SODIUM 40 MG PO TBEC: DELAYED_RELEASE_TABLET | ORAL | 3 refills | Status: DC

## 2020-08-26 NOTE — Telephone Encounter (Signed)
*  STAT* If patient is at the pharmacy, call can be transferred to refill team.   1. Which medications need to be refilled? (please list name of each medication and dose if known) Pantoprazole Lisinopril   2. Which pharmacy/location (including street and city if local pharmacy) is medication to be sent to? [facility] [place] [address]   3. Do they need a 30 day or 90 day supply? 90 patient has all ready had apt

## 2020-08-26 NOTE — Telephone Encounter (Signed)
Pt's medications were sent to pt's pharmacy as requested. Confirmation received.  

## 2020-10-26 ENCOUNTER — Other Ambulatory Visit: Payer: Medicare Other | Admitting: Internal Medicine

## 2020-10-26 DIAGNOSIS — K219 Gastro-esophageal reflux disease without esophagitis: Secondary | ICD-10-CM

## 2020-10-26 DIAGNOSIS — I10 Essential (primary) hypertension: Secondary | ICD-10-CM

## 2020-10-26 DIAGNOSIS — E781 Pure hyperglyceridemia: Secondary | ICD-10-CM

## 2020-10-26 DIAGNOSIS — Z0000 Encounter for general adult medical examination without abnormal findings: Secondary | ICD-10-CM

## 2020-10-26 DIAGNOSIS — Z955 Presence of coronary angioplasty implant and graft: Secondary | ICD-10-CM

## 2020-10-26 DIAGNOSIS — R7302 Impaired glucose tolerance (oral): Secondary | ICD-10-CM

## 2020-11-05 ENCOUNTER — Encounter: Payer: Medicare Other | Admitting: Internal Medicine

## 2020-12-07 ENCOUNTER — Other Ambulatory Visit: Payer: Medicare Other | Admitting: Internal Medicine

## 2020-12-07 ENCOUNTER — Other Ambulatory Visit: Payer: Self-pay

## 2020-12-07 DIAGNOSIS — K859 Acute pancreatitis without necrosis or infection, unspecified: Secondary | ICD-10-CM | POA: Diagnosis not present

## 2020-12-07 DIAGNOSIS — E786 Lipoprotein deficiency: Secondary | ICD-10-CM

## 2020-12-07 DIAGNOSIS — I10 Essential (primary) hypertension: Secondary | ICD-10-CM

## 2020-12-07 DIAGNOSIS — K219 Gastro-esophageal reflux disease without esophagitis: Secondary | ICD-10-CM

## 2020-12-07 DIAGNOSIS — Z125 Encounter for screening for malignant neoplasm of prostate: Secondary | ICD-10-CM

## 2020-12-07 DIAGNOSIS — Z0000 Encounter for general adult medical examination without abnormal findings: Secondary | ICD-10-CM | POA: Diagnosis not present

## 2020-12-07 DIAGNOSIS — IMO0002 Reserved for concepts with insufficient information to code with codable children: Secondary | ICD-10-CM

## 2020-12-07 DIAGNOSIS — E781 Pure hyperglyceridemia: Secondary | ICD-10-CM | POA: Diagnosis not present

## 2020-12-07 DIAGNOSIS — Z955 Presence of coronary angioplasty implant and graft: Secondary | ICD-10-CM

## 2020-12-08 ENCOUNTER — Encounter: Payer: Self-pay | Admitting: Internal Medicine

## 2020-12-08 ENCOUNTER — Ambulatory Visit (INDEPENDENT_AMBULATORY_CARE_PROVIDER_SITE_OTHER): Payer: Medicare Other | Admitting: Internal Medicine

## 2020-12-08 VITALS — BP 130/70 | HR 60 | Ht 63.75 in | Wt 165.0 lb

## 2020-12-08 DIAGNOSIS — E786 Lipoprotein deficiency: Secondary | ICD-10-CM

## 2020-12-08 DIAGNOSIS — R7302 Impaired glucose tolerance (oral): Secondary | ICD-10-CM

## 2020-12-08 DIAGNOSIS — I10 Essential (primary) hypertension: Secondary | ICD-10-CM

## 2020-12-08 DIAGNOSIS — K219 Gastro-esophageal reflux disease without esophagitis: Secondary | ICD-10-CM

## 2020-12-08 DIAGNOSIS — M5481 Occipital neuralgia: Secondary | ICD-10-CM

## 2020-12-08 DIAGNOSIS — IMO0002 Reserved for concepts with insufficient information to code with codable children: Secondary | ICD-10-CM

## 2020-12-08 DIAGNOSIS — E781 Pure hyperglyceridemia: Secondary | ICD-10-CM

## 2020-12-08 DIAGNOSIS — Z9889 Other specified postprocedural states: Secondary | ICD-10-CM

## 2020-12-08 DIAGNOSIS — Z0000 Encounter for general adult medical examination without abnormal findings: Secondary | ICD-10-CM | POA: Diagnosis not present

## 2020-12-08 DIAGNOSIS — Z8619 Personal history of other infectious and parasitic diseases: Secondary | ICD-10-CM

## 2020-12-08 DIAGNOSIS — Z955 Presence of coronary angioplasty implant and graft: Secondary | ICD-10-CM | POA: Diagnosis not present

## 2020-12-08 DIAGNOSIS — K859 Acute pancreatitis without necrosis or infection, unspecified: Secondary | ICD-10-CM

## 2020-12-08 DIAGNOSIS — Z8679 Personal history of other diseases of the circulatory system: Secondary | ICD-10-CM

## 2020-12-08 LAB — COMPLETE METABOLIC PANEL WITH GFR
AG Ratio: 1.9 (calc) (ref 1.0–2.5)
ALT: 26 U/L (ref 9–46)
AST: 22 U/L (ref 10–35)
Albumin: 4.6 g/dL (ref 3.6–5.1)
Alkaline phosphatase (APISO): 47 U/L (ref 35–144)
BUN: 15 mg/dL (ref 7–25)
CO2: 26 mmol/L (ref 20–32)
Calcium: 9.5 mg/dL (ref 8.6–10.3)
Chloride: 102 mmol/L (ref 98–110)
Creat: 1.15 mg/dL (ref 0.70–1.25)
GFR, Est African American: 76 mL/min/{1.73_m2} (ref >=60)
GFR, Est Non African American: 66 mL/min/{1.73_m2} (ref >=60)
Globulin: 2.4 g/dL (calc) (ref 1.9–3.7)
Glucose, Bld: 128 mg/dL — ABNORMAL HIGH (ref 65–99)
Potassium: 4.3 mmol/L (ref 3.5–5.3)
Sodium: 135 mmol/L (ref 135–146)
Total Bilirubin: 0.7 mg/dL (ref 0.2–1.2)
Total Protein: 7 g/dL (ref 6.1–8.1)

## 2020-12-08 LAB — CBC WITH DIFFERENTIAL/PLATELET
Absolute Monocytes: 583 cells/uL (ref 200–950)
Basophils Absolute: 27 cells/uL (ref 0–200)
Basophils Relative: 0.4 %
Eosinophils Absolute: 462 cells/uL (ref 15–500)
Eosinophils Relative: 6.9 %
HCT: 40.9 % (ref 38.5–50.0)
Hemoglobin: 13.6 g/dL (ref 13.2–17.1)
Lymphs Abs: 804 cells/uL — ABNORMAL LOW (ref 850–3900)
MCH: 28.8 pg (ref 27.0–33.0)
MCHC: 33.3 g/dL (ref 32.0–36.0)
MCV: 86.5 fL (ref 80.0–100.0)
MPV: 10.7 fL (ref 7.5–12.5)
Monocytes Relative: 8.7 %
Neutro Abs: 4824 cells/uL (ref 1500–7800)
Neutrophils Relative %: 72 %
Platelets: 240 10*3/uL (ref 140–400)
RBC: 4.73 10*6/uL (ref 4.20–5.80)
RDW: 12.2 % (ref 11.0–15.0)
Total Lymphocyte: 12 %
WBC: 6.7 10*3/uL (ref 3.8–10.8)

## 2020-12-08 LAB — LIPID PANEL
Cholesterol: 107 mg/dL (ref <200)
HDL: 44 mg/dL (ref >=40)
LDL Cholesterol (Calc): 42 mg/dL (calc)
Non-HDL Cholesterol (Calc): 63 mg/dL (calc) (ref <130)
Total CHOL/HDL Ratio: 2.4 (calc) (ref <5.0)
Triglycerides: 129 mg/dL (ref <150)

## 2020-12-08 LAB — PSA: PSA: 0.7 ng/mL (ref <=4.00)

## 2020-12-08 LAB — HEMOGLOBIN A1C
Hgb A1c MFr Bld: 6 % of total Hgb — ABNORMAL HIGH (ref <5.7)
Mean Plasma Glucose: 126 mg/dL
eAG (mmol/L): 7 mmol/L

## 2020-12-08 MED ORDER — METFORMIN HCL 500 MG PO TABS: 500.0000 mg | ORAL_TABLET | Freq: Every day | ORAL | 1 refills | Status: DC
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
## 2020-12-18 NOTE — Progress Notes (Signed)
NEUROLOGY FOLLOW UP OFFICE NOTE  David Cantu [id number]  Assessment/Plan:   Possible atypical (intermittent) nummular headache, improved with lifestyle modification  1.  Monitor 2.  Follow up 6 months.  Subjective:  David Cantu is a 65 year old right-handed male who follows up for headache.  UPDATE: MRI and MRA of brain on 06/20/2020 personally reviewed were normal.    Since starting to go to the gym, they are less frequent and intense.  They are moderate and occurring twice a week.  Doesn't need to take anything    HISTORY: In early 2021, started getting right upper occipital pain (around the crown), described as a cramp that builds up until he felt he was being pushed in the back of his head.  Lasts 30 seconds.  Sometimes may occur in the left side as well.  Started occurring once a week, and then became more frequent until it was occurring twice a day.  It happens at any time, whether he is driving, watching TV or any other activity.  No associated numbness, visual disturbance, nausea, photophobia, phonophobia.  He may note some suboccipital tightness but no real neck pain and no pain or numbness into the upper extremities.     He has a remote history of migraines but not for many years.  He has never had headaches like this before.  PAST MEDICAL HISTORY: Past Medical History:  Diagnosis Date  . Acute pancreatitis 02/03/2014; 05/12/2016  . Arthritis    "shoulders" (05/12/2016)  . CAD (coronary artery disease)    a. L&R cath 07/09/15 normal LV function, no MR or AS, normal R heart pressure, No pulmonary HTN, 70% RCA s/p DES  . HCWCBJSE(831.5)    "couple times/month" (05/12/2016)  . Heart murmur   . Hypertension   . Migraine    "might have 5-6/year" (05/12/2016)  . Pneumonia 1960; ~ 1992  . Pre-diabetes   . Ringing in the ears    "continuous ringing in my ears" (05/12/2016)  . Shingles     MEDICATIONS: Current Outpatient Medications on File Prior to Visit   Medication Sig Dispense Refill  . acetaminophen (TYLENOL) 325 MG tablet Take 325-650 mg by mouth at bedtime as needed (for shoulder pain).     Marland Kitchen aspirin 81 MG chewable tablet Chew 1 tablet (81 mg total) by mouth daily. 30 tablet 11  . carvedilol (COREG) 6.25 MG tablet TAKE 1 TABLET(6.25 MG) BY MOUTH TWICE DAILY WITH A MEAL 180 tablet 3  . ezetimibe (ZETIA) 10 MG tablet Take 1 tablet (10 mg total) by mouth daily. 90 tablet 3  . lisinopril (ZESTRIL) 10 MG tablet Take 1 tablet (10 mg total) by mouth daily. 90 tablet 3  . metFORMIN (GLUCOPHAGE) 500 MG tablet Take 1 tablet (500 mg total) by mouth daily with supper for 1 dose. 90 tablet 1  . nitroGLYCERIN (NITROSTAT) 0.4 MG SL tablet Place 1 tablet (0.4 mg total) under the tongue every 5 (five) minutes as needed for chest pain. 25 tablet 3  . pantoprazole (PROTONIX) 40 MG tablet TAKE 1 TABLET(40 MG) BY MOUTH DAILY 90 tablet 3  . rosuvastatin (CRESTOR) 40 MG tablet Take 1 tablet (40 mg total) by mouth daily. 90 tablet 3   No current facility-administered medications on file prior to visit.    ALLERGIES: Allergies  Allergen Reactions  . Zenpep [Pancrelipase (Lip-Prot-Amyl)] Nausea And Vomiting    VIOLENT VOMITING    FAMILY HISTORY: Family History  Problem Relation Age of Onset  . Lung  cancer Mother   . Lung cancer Father   . Colon cancer Neg Hx       Objective:  Blood pressure 139/61, pulse (!) 58, height 5\' 5"  (1.651 m), weight 165 lb 6.4 oz (75 kg), SpO2 98 %. General: No acute distress.  Patient appears well-groomed.   Head:  Normocephalic/atraumatic Eyes:  Fundi examined but not visualized Neck: supple, no paraspinal tenderness, full range of motion Heart:  Regular rate and rhythm Lungs:  Clear to auscultation bilaterally Back: No paraspinal tenderness Neurological Exam: alert and oriented to person, place, and time. Speech fluent and not dysarthric, language intact.  CN II-XII intact. Bulk and tone normal, muscle strength 5/5  throughout.  Sensation to light touch intact.  Deep tendon reflexes 2+ throughout, toes downgoing.  Finger to nose testing intact.  Gait normal, Romberg negative.     , DO  CC: [name], MD

## 2020-12-20 NOTE — Patient Instructions (Addendum)
It was a pleasure to see you today. RTC in one year or as needed. Recheck Hgb AIC in July.

## 2020-12-21 ENCOUNTER — Other Ambulatory Visit: Payer: Self-pay

## 2020-12-21 ENCOUNTER — Encounter: Payer: Self-pay | Admitting: Neurology

## 2020-12-21 ENCOUNTER — Ambulatory Visit: Payer: Medicare Other | Admitting: Neurology

## 2020-12-21 VITALS — BP 139/61 | HR 58 | Ht 65.0 in | Wt 165.4 lb

## 2020-12-21 DIAGNOSIS — G4489 Other headache syndrome: Secondary | ICD-10-CM

## 2020-12-21 NOTE — Patient Instructions (Signed)
Continue to monitor

## 2020-12-21 NOTE — Addendum Note (Signed)
Addended by: [name] on: 12/21/2020 11:11 AM   Modules accepted: Level of Service

## 2020-12-22 ENCOUNTER — Other Ambulatory Visit: Payer: Self-pay | Admitting: Cardiology

## 2021-02-08 ENCOUNTER — Other Ambulatory Visit: Payer: Self-pay

## 2021-02-08 ENCOUNTER — Other Ambulatory Visit: Payer: Medicare Other | Admitting: Internal Medicine

## 2021-02-08 DIAGNOSIS — R7302 Impaired glucose tolerance (oral): Secondary | ICD-10-CM

## 2021-02-09 LAB — HEMOGLOBIN A1C
Hgb A1c MFr Bld: 5.9 % of total Hgb — ABNORMAL HIGH (ref <5.7)
Mean Plasma Glucose: 123 mg/dL
eAG (mmol/L): 6.8 mmol/L

## 2021-02-15 ENCOUNTER — Telehealth: Payer: Self-pay | Admitting: Internal Medicine

## 2021-02-15 NOTE — Telephone Encounter (Signed)
David Cantu [phone]  [name] is calling, he said he seen his lab results in MyChart however does he need to do anything different or keep taking same medication.

## 2021-02-16 NOTE — Telephone Encounter (Signed)
Please advise. Thank you

## 2021-05-31 ENCOUNTER — Other Ambulatory Visit: Payer: Self-pay | Admitting: Internal Medicine

## 2021-06-17 ENCOUNTER — Telehealth: Payer: Self-pay

## 2021-06-17 ENCOUNTER — Ambulatory Visit (INDEPENDENT_AMBULATORY_CARE_PROVIDER_SITE_OTHER): Payer: Medicare Other | Admitting: Internal Medicine

## 2021-06-17 ENCOUNTER — Encounter: Payer: Self-pay | Admitting: Internal Medicine

## 2021-06-17 ENCOUNTER — Other Ambulatory Visit: Payer: Self-pay

## 2021-06-17 VITALS — HR 71 | Temp 98.2°F | Resp 16

## 2021-06-17 DIAGNOSIS — I10 Essential (primary) hypertension: Secondary | ICD-10-CM

## 2021-06-17 DIAGNOSIS — Z8679 Personal history of other diseases of the circulatory system: Secondary | ICD-10-CM | POA: Diagnosis not present

## 2021-06-17 DIAGNOSIS — J22 Unspecified acute lower respiratory infection: Secondary | ICD-10-CM

## 2021-06-17 DIAGNOSIS — Z955 Presence of coronary angioplasty implant and graft: Secondary | ICD-10-CM | POA: Diagnosis not present

## 2021-06-17 MED ORDER — AZITHROMYCIN 250 MG PO TABS: ORAL_TABLET | ORAL | 0 refills | Status: AC

## 2021-06-17 MED ORDER — AZITHROMYCIN 250 MG PO TABS: ORAL_TABLET | ORAL | 0 refills | Status: DC

## 2021-06-17 MED ORDER — HYDROCODONE BIT-HOMATROP MBR 5-1.5 MG/5ML PO SOLN: 5.0000 mL | Freq: Three times a day (TID) | ORAL | 0 refills | Status: DC | PRN

## 2021-06-17 NOTE — Progress Notes (Signed)
Phoned in since it failed twice.

## 2021-06-17 NOTE — Telephone Encounter (Signed)
Patient called wanting an appt. States that he has had a cough and felt crappy x 1 week. He has body aches, headaches and a sore throat as well. No fever no ear pain. He is going to take a covid test and call us with results.

## 2021-06-17 NOTE — Progress Notes (Signed)
   Subjective:    Patient ID: David Cantu, male    DOB: 06-Oct-1955, 65 y.o.   MRN: [id number]  HPI 65 year old Male recently traveled to [place].  He has had cough, malaise and fatigue for about a week.  Complains of sore throat myalgia and headache.  No documented fever.  He has had at least 3 COVID vaccines.  Had negative home COVID test.  History of PCI right coronary artery with placement of drug-eluting stent in 2016.  Has been hypertensive since approximately 1997.  History of recurrent pancreatitis but last episode was February 2018.  Had extensive evaluation.  History of GE reflux.  History of pneumonia in 1990.  History of impaired glucose tolerance and mixed hyperlipidemia.    Review of Systems no nausea or vomiting.  No diarrhea.  No sore throat.     Objective:   Physical Exam He is afebrile.  He looks fatigued.  TMs are clear.  Pharynx slightly injected.  Neck supple.  Chest clear.  Vital signs pulse 71 regular respiratory rate 16 pulse oximetry 97% temperature 98.2 degrees     Assessment & Plan:  Acute lower respiratory infection  History of coronary artery disease status post stent placement to right coronary artery  Hypertension  History of impaired glucose tolerance  History of GE reflux  History of mixed hyperlipidemia treated with Crestor and Zetia  History of recurrent pancreatitis  Plan: Patient would like something for cough.  He will be prescribed Hycodan 1 teaspoon every 8 hours as needed for cough.  Also prescribed Zithromax Z-PAK 2 tabs day 1 followed by 1 tab days 2 through 5.  Rest and drink fluids.

## 2021-06-17 NOTE — Telephone Encounter (Signed)
scheduled

## 2021-06-19 NOTE — Patient Instructions (Signed)
We are sorry you not feeling well.  Take Zithromax Z-PAK 2 tabs day 1 followed by 1 tab days 2 through 5.  Take Hycodan sparingly 1 teaspoon every 8 hours as needed for cough.  Rest and drink plenty of fluids.  Call if not better in 48 to 72 hours or sooner if worse.  Walk some to prevent atelectasis and monitor pulse oximetry.

## 2021-06-23 NOTE — Progress Notes (Signed)
NEUROLOGY FOLLOW UP OFFICE NOTE  Reinhardt Hillis [id number]  Assessment/Plan:   Possible atypical (intermittent) nummular vs cervicogenic headache  Continue exercise and diet. Follow up as needed.  Subjective:  David Cantu is a 65 year old right-handed male who follows up for headache.   UPDATE:  Since starting to go to the gym, they have resolved.  Only a "twinge" once in awhile.   HISTORY: In early 2021, started getting right upper occipital pain (around the crown), described as a cramp that builds up until he felt he was being pushed in the back of his head.  Lasts 30 seconds.  Sometimes may occur in the left side as well.  Started occurring once a week, and then became more frequent until it was occurring twice a day.  It happens at any time, whether he is driving, watching TV or any other activity.  No associated numbness, visual disturbance, nausea, photophobia, phonophobia.  He may note some suboccipital tightness but no real neck pain and no pain or numbness into the upper extremities.   MRI and MRA of brain on 06/20/2020 were normal.   He has a remote history of migraines but not for many years.  He has never had headaches like this before.  PAST MEDICAL HISTORY: Past Medical History:  Diagnosis Date   Acute pancreatitis 02/03/2014; 05/12/2016   Arthritis    "shoulders" (05/12/2016)   CAD (coronary artery disease)    a. L&R cath 07/09/15 normal LV function, no MR or AS, normal R heart pressure, No pulmonary HTN, 70% RCA s/p DES   Headache(784.0)    "couple times/month" (05/12/2016)   Heart murmur    Hypertension    Migraine    "might have 5-6/year" (05/12/2016)   Pneumonia 1960; ~ [place] in the ears    "continuous ringing in my ears" (05/12/2016)   Shingles     MEDICATIONS: Current Outpatient Medications on File Prior to Visit  Medication Sig Dispense Refill   acetaminophen (TYLENOL) 325 MG tablet Take 325-650 mg by mouth at  bedtime as needed (for shoulder pain).      aspirin 81 MG chewable tablet Chew 1 tablet (81 mg total) by mouth daily. 30 tablet 11   carvedilol (COREG) 6.25 MG tablet TAKE 1 TABLET(6.25 MG) BY MOUTH TWICE DAILY WITH A MEAL 180 tablet 3   ezetimibe (ZETIA) 10 MG tablet TAKE 1 TABLET(10 MG) BY MOUTH DAILY 90 tablet 3   HYDROcodone bit-homatropine (HYCODAN) 5-1.5 MG/5ML syrup Take 5 mLs by mouth every 8 (eight) hours as needed for cough. 120 mL 0   lisinopril (ZESTRIL) 10 MG tablet Take 1 tablet (10 mg total) by mouth daily. 90 tablet 3   metFORMIN (GLUCOPHAGE) 500 MG tablet TAKE 1 TABLET(500 MG) BY MOUTH DAILY WITH SUPPER 90 tablet 1   nitroGLYCERIN (NITROSTAT) 0.4 MG SL tablet Place 1 tablet (0.4 mg total) under the tongue every 5 (five) minutes as needed for chest pain. 25 tablet 3   pantoprazole (PROTONIX) 40 MG tablet TAKE 1 TABLET(40 MG) BY MOUTH DAILY 90 tablet 3   rosuvastatin (CRESTOR) 40 MG tablet Take 1 tablet (40 mg total) by mouth daily. 90 tablet 3   No current facility-administered medications on file prior to visit.    ALLERGIES: Allergies  Allergen Reactions   Zenpep [Pancrelipase (Lip-Prot-Amyl)] Nausea And Vomiting    VIOLENT VOMITING    FAMILY HISTORY: Family History  Problem Relation Age of Onset   Lung cancer  Mother    Lung cancer Father    Colon cancer Neg Hx       Objective:  Blood pressure (!) 156/75, pulse 66, height 5\' 5"  (1.651 m), weight 162 lb 9.6 oz (73.8 kg), SpO2 98 %. General: No acute distress.  Patient appears well-groomed.   Head:  Normocephalic/atraumatic Eyes:  Fundi examined but not visualized Neck: supple, no paraspinal tenderness, full range of motion Heart:  Regular rate and rhythm Lungs:  Clear to auscultation bilaterally Back: No paraspinal tenderness Neurological Exam: alert and oriented to person, place, and time.  Speech fluent and not dysarthric, language intact.  CN II-XII intact. Bulk and tone normal, muscle strength 5/5  throughout.  Sensation to light touch intact.  Deep tendon reflexes 2+ throughout, toes downgoing.  Finger to nose testing intact.  Gait normal, Romberg negative.   , DO  CC: [name], MD

## 2021-06-28 ENCOUNTER — Ambulatory Visit: Payer: Medicare Other | Admitting: Neurology

## 2021-06-28 ENCOUNTER — Encounter: Payer: Self-pay | Admitting: Neurology

## 2021-06-28 ENCOUNTER — Other Ambulatory Visit: Payer: Self-pay

## 2021-06-28 VITALS — BP 156/75 | HR 66 | Ht 65.0 in | Wt 162.6 lb

## 2021-06-28 DIAGNOSIS — G4489 Other headache syndrome: Secondary | ICD-10-CM | POA: Diagnosis not present

## 2021-06-28 NOTE — Patient Instructions (Signed)
Continue what you are doing If headaches return, contact me Otherwise, follow up as needed

## 2021-08-16 ENCOUNTER — Other Ambulatory Visit: Payer: Self-pay | Admitting: Cardiology

## 2021-08-17 ENCOUNTER — Other Ambulatory Visit: Payer: Self-pay

## 2021-08-17 MED ORDER — LISINOPRIL 10 MG PO TABS: 10.0000 mg | ORAL_TABLET | Freq: Every day | ORAL | 0 refills | Status: DC

## 2021-08-17 MED ORDER — PANTOPRAZOLE SODIUM 40 MG PO TBEC: DELAYED_RELEASE_TABLET | ORAL | 0 refills | Status: DC

## 2021-08-17 NOTE — Telephone Encounter (Signed)
Pt's medications were sent to pt's pharmacy as requested. Confirmation received.  

## 2021-08-20 ENCOUNTER — Encounter: Payer: Self-pay | Admitting: Cardiology

## 2021-08-20 ENCOUNTER — Ambulatory Visit: Payer: Medicare Other | Admitting: Cardiology

## 2021-08-20 ENCOUNTER — Other Ambulatory Visit: Payer: Self-pay

## 2021-08-20 VITALS — BP 160/70 | HR 57 | Ht 65.0 in | Wt 164.0 lb

## 2021-08-20 DIAGNOSIS — R011 Cardiac murmur, unspecified: Secondary | ICD-10-CM

## 2021-08-20 DIAGNOSIS — R0609 Other forms of dyspnea: Secondary | ICD-10-CM | POA: Diagnosis not present

## 2021-08-20 DIAGNOSIS — I2583 Coronary atherosclerosis due to lipid rich plaque: Secondary | ICD-10-CM

## 2021-08-20 DIAGNOSIS — I2510 Atherosclerotic heart disease of native coronary artery without angina pectoris: Secondary | ICD-10-CM | POA: Diagnosis not present

## 2021-08-20 DIAGNOSIS — R55 Syncope and collapse: Secondary | ICD-10-CM | POA: Diagnosis not present

## 2021-08-20 DIAGNOSIS — I10 Essential (primary) hypertension: Secondary | ICD-10-CM

## 2021-08-20 DIAGNOSIS — E782 Mixed hyperlipidemia: Secondary | ICD-10-CM

## 2021-08-20 NOTE — Assessment & Plan Note (Signed)
Elevated today.  Usually under good control.  Does have ups and downs.  Likely has part of a whitecoat component

## 2021-08-20 NOTE — Patient Instructions (Signed)
Medication Instructions:  The current medical regimen is effective;  continue present plan and medications.  *If you need a refill on your cardiac medications before your next appointment, please call your pharmacy*  Testing/Procedures: Your physician has requested that you have an echocardiogram. Echocardiography is a painless test that uses sound waves to create images of your heart. It provides your doctor with information about the size and shape of your heart and how well your heart's chambers and valves are working. This procedure takes approximately one hour. There are no restrictions for this procedure.  Follow-Up: At [facility], you and your health needs are our priority.  As part of our continuing mission to provide you with exceptional heart care, we have created designated Provider Care Teams.  These Care Teams include your primary Cardiologist (physician) and Advanced Practice Providers (APPs -  Physician Assistants and Nurse Practitioners) who all work together to provide you with the care you need, when you need it.  We recommend signing up for the patient portal called "MyChart".  Sign up information is provided on this After Visit Summary.  MyChart is used to connect with patients for Virtual Visits (Telemedicine).  Patients are able to view lab/test results, encounter notes, upcoming appointments, etc.  Non-urgent messages can be sent to your provider as well.   To learn more about what you can do with MyChart, go to [email or web address].    Your next appointment:   1 year(s)  The format for your next appointment:   In Person  Provider:   Mark Skains, MD     Thank you for choosing [facility]!!    

## 2021-08-20 NOTE — Assessment & Plan Note (Signed)
Systolic murmur noted.  He states that he had murmur since he was a child.  It is been several years since we have checked an echocardiogram.  We will go ahead and evaluate.
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
## 2021-08-20 NOTE — Assessment & Plan Note (Signed)
Excellent LDL 42.  Continue with Crestor 40.  Zetia 10.  No myalgias.

## 2021-08-20 NOTE — Assessment & Plan Note (Signed)
Likely a vagal type response causing transient hypotension decreased vision.  This has not been a recurring theme for him.  This was the first time he gave blood.  Continue to monitor for any recurrence.  I am comfortable with him giving blood however for hand make sure he eats something and drinks Gatorade.

## 2021-08-20 NOTE — Assessment & Plan Note (Signed)
RCA stent.  EKG done today.  Excellent.  No signs of ischemia.

## 2021-09-06 ENCOUNTER — Other Ambulatory Visit: Payer: Self-pay

## 2021-09-06 ENCOUNTER — Ambulatory Visit (HOSPITAL_COMMUNITY): Payer: Medicare Other | Attending: Cardiology

## 2021-09-06 DIAGNOSIS — I2583 Coronary atherosclerosis due to lipid rich plaque: Secondary | ICD-10-CM | POA: Insufficient documentation

## 2021-09-06 DIAGNOSIS — I2510 Atherosclerotic heart disease of native coronary artery without angina pectoris: Secondary | ICD-10-CM | POA: Diagnosis not present

## 2021-09-06 DIAGNOSIS — R0609 Other forms of dyspnea: Secondary | ICD-10-CM | POA: Diagnosis not present

## 2021-09-06 DIAGNOSIS — I10 Essential (primary) hypertension: Secondary | ICD-10-CM | POA: Insufficient documentation

## 2021-09-06 LAB — ECHOCARDIOGRAM COMPLETE
Area-P 1/2: 3.99 cm2
S' Lateral: 2.2 cm

## 2021-09-07 ENCOUNTER — Telehealth: Payer: Self-pay | Admitting: Internal Medicine

## 2021-09-07 ENCOUNTER — Encounter: Payer: Self-pay | Admitting: Internal Medicine

## 2021-09-07 ENCOUNTER — Ambulatory Visit (INDEPENDENT_AMBULATORY_CARE_PROVIDER_SITE_OTHER): Payer: Medicare Other | Admitting: Internal Medicine

## 2021-09-07 VITALS — BP 160/80 | HR 61 | Temp 97.3°F | Ht <= 58 in | Wt 168.8 lb

## 2021-09-07 DIAGNOSIS — I10 Essential (primary) hypertension: Secondary | ICD-10-CM

## 2021-09-07 DIAGNOSIS — Z955 Presence of coronary angioplasty implant and graft: Secondary | ICD-10-CM | POA: Diagnosis not present

## 2021-09-07 DIAGNOSIS — L237 Allergic contact dermatitis due to plants, except food: Secondary | ICD-10-CM

## 2021-09-07 DIAGNOSIS — Z8679 Personal history of other diseases of the circulatory system: Secondary | ICD-10-CM | POA: Diagnosis not present

## 2021-09-07 MED ORDER — METHYLPREDNISOLONE 4 MG PO TABS: ORAL_TABLET | ORAL | 0 refills | Status: DC

## 2021-09-07 NOTE — Telephone Encounter (Signed)
Wang [place]ada [phone]  David Cantu called to say he had been in [place] helping a friend and now he has a rash for the last week all over and would like to come in.

## 2021-09-07 NOTE — Progress Notes (Signed)
° °  Subjective:    Patient ID: David Cantu, male    DOB: Dec 11, 1955, 66 y.o.   MRN: [id number]  HPI 66 year old Male went to [place] to do some home repair outside the home. Was crawling on the ground dealing with water pipes and has developed a severe itchy rash on arms below the shirt sleeve area bilaterally.  He cannot get any relief from the itching.  He was here in May 2022 for Medicare wellness and health maintenance exam.  He has history of PCI to the right coronary artery with drug-eluting stent placed in 2016 followed by Dr. Anne Fu.  He has a history of hypertension.    Review of Systems no fever.  Was not bitten by insects that he is aware of.  No flulike symptoms.     Objective:   Physical Exam He has lesions on his arms consistent with poison ivy i.e. linear papular erythematous areas that are known to be consistent with poison ivy.  He has generalized erythema on his arms as well.  No bullous lesions.       Assessment & Plan:   Poison ivy dermatitis  Plan: He will be treated with a tapering course of Medrol 4 mg tablets starting with 6x 4 mg tablets on day 1 and decreasing every 2 days x 1 tablet i.e. 6-6-5-5-4-4-3-3-2-2-1-1 taper.  He may apply calamine lotion to the rash.

## 2021-09-07 NOTE — Telephone Encounter (Signed)
After talking to Dr Lenord Fellers schedule for office visit.

## 2021-09-13 ENCOUNTER — Ambulatory Visit: Payer: Medicare Other | Admitting: Cardiology

## 2021-09-26 NOTE — Patient Instructions (Signed)
Take Medrol in tapering course as directed over 12 days.  Call if not improving.  May apply calamine lotion topically to rash.

## 2021-10-18 ENCOUNTER — Other Ambulatory Visit: Payer: Self-pay | Admitting: Cardiology

## 2021-10-28 ENCOUNTER — Other Ambulatory Visit: Payer: Self-pay | Admitting: Cardiology

## 2021-12-17 ENCOUNTER — Other Ambulatory Visit: Payer: Self-pay | Admitting: Internal Medicine

## 2022-01-14 ENCOUNTER — Other Ambulatory Visit: Payer: Self-pay | Admitting: Cardiology

## 2022-02-08 ENCOUNTER — Other Ambulatory Visit: Payer: Medicare Other

## 2022-02-08 DIAGNOSIS — I10 Essential (primary) hypertension: Secondary | ICD-10-CM | POA: Diagnosis not present

## 2022-02-08 DIAGNOSIS — E782 Mixed hyperlipidemia: Secondary | ICD-10-CM

## 2022-02-08 DIAGNOSIS — Z125 Encounter for screening for malignant neoplasm of prostate: Secondary | ICD-10-CM | POA: Diagnosis not present

## 2022-02-08 DIAGNOSIS — R7301 Impaired fasting glucose: Secondary | ICD-10-CM | POA: Diagnosis not present

## 2022-02-11 ENCOUNTER — Ambulatory Visit (INDEPENDENT_AMBULATORY_CARE_PROVIDER_SITE_OTHER): Payer: Medicare Other | Admitting: Internal Medicine

## 2022-02-11 ENCOUNTER — Encounter: Payer: Self-pay | Admitting: Internal Medicine

## 2022-02-11 VITALS — BP 118/68 | HR 52 | Temp 97.6°F | Ht 64.5 in | Wt 162.2 lb

## 2022-02-11 DIAGNOSIS — Z955 Presence of coronary angioplasty implant and graft: Secondary | ICD-10-CM

## 2022-02-11 DIAGNOSIS — Z9889 Other specified postprocedural states: Secondary | ICD-10-CM

## 2022-02-11 DIAGNOSIS — Z23 Encounter for immunization: Secondary | ICD-10-CM

## 2022-02-11 DIAGNOSIS — Z8679 Personal history of other diseases of the circulatory system: Secondary | ICD-10-CM

## 2022-02-11 DIAGNOSIS — E786 Lipoprotein deficiency: Secondary | ICD-10-CM

## 2022-02-11 DIAGNOSIS — Z8719 Personal history of other diseases of the digestive system: Secondary | ICD-10-CM

## 2022-02-11 DIAGNOSIS — K219 Gastro-esophageal reflux disease without esophagitis: Secondary | ICD-10-CM

## 2022-02-11 DIAGNOSIS — Z0000 Encounter for general adult medical examination without abnormal findings: Secondary | ICD-10-CM

## 2022-02-11 DIAGNOSIS — R7302 Impaired glucose tolerance (oral): Secondary | ICD-10-CM

## 2022-02-11 DIAGNOSIS — I10 Essential (primary) hypertension: Secondary | ICD-10-CM

## 2022-02-11 LAB — POCT URINALYSIS DIPSTICK
Bilirubin, UA: NEGATIVE
Blood, UA: NEGATIVE
Glucose, UA: NEGATIVE
Ketones, UA: NEGATIVE
Leukocytes, UA: NEGATIVE
Nitrite, UA: NEGATIVE
Protein, UA: NEGATIVE
Spec Grav, UA: 1.015 (ref 1.010–1.025)
Urobilinogen, UA: 0.2 E.U./dL
pH, UA: 6.5 (ref 5.0–8.0)
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
## 2022-02-12 LAB — COMPLETE METABOLIC PANEL WITH GFR
AG Ratio: 2 (calc) (ref 1.0–2.5)
ALT: 29 U/L (ref 9–46)
AST: 25 U/L (ref 10–35)
Albumin: 4.6 g/dL (ref 3.6–5.1)
Alkaline phosphatase (APISO): 45 U/L (ref 35–144)
BUN: 15 mg/dL (ref 7–25)
CO2: 29 mmol/L (ref 20–32)
Calcium: 9.8 mg/dL (ref 8.6–10.3)
Chloride: 102 mmol/L (ref 98–110)
Creat: 1.26 mg/dL (ref 0.70–1.35)
Globulin: 2.3 g/dL (calc) (ref 1.9–3.7)
Glucose, Bld: 125 mg/dL — ABNORMAL HIGH (ref 65–99)
Potassium: 4.9 mmol/L (ref 3.5–5.3)
Sodium: 138 mmol/L (ref 135–146)
Total Bilirubin: 0.7 mg/dL (ref 0.2–1.2)
Total Protein: 6.9 g/dL (ref 6.1–8.1)
eGFR: 63 mL/min/{1.73_m2} (ref >=60)

## 2022-02-12 LAB — CBC WITH DIFFERENTIAL/PLATELET
Absolute Monocytes: 542 cells/uL (ref 200–950)
Basophils Absolute: 32 cells/uL (ref 0–200)
Basophils Relative: 0.5 %
Eosinophils Absolute: 384 cells/uL (ref 15–500)
Eosinophils Relative: 6.1 %
HCT: 40.7 % (ref 38.5–50.0)
Hemoglobin: 13.5 g/dL (ref 13.2–17.1)
Lymphs Abs: 756 cells/uL — ABNORMAL LOW (ref 850–3900)
MCH: 28.7 pg (ref 27.0–33.0)
MCHC: 33.2 g/dL (ref 32.0–36.0)
MCV: 86.4 fL (ref 80.0–100.0)
MPV: 10.8 fL (ref 7.5–12.5)
Monocytes Relative: 8.6 %
Neutro Abs: 4586 cells/uL (ref 1500–7800)
Neutrophils Relative %: 72.8 %
Platelets: 215 10*3/uL (ref 140–400)
RBC: 4.71 10*6/uL (ref 4.20–5.80)
RDW: 12.2 % (ref 11.0–15.0)
Total Lymphocyte: 12 %
WBC: 6.3 10*3/uL (ref 3.8–10.8)

## 2022-02-12 LAB — HEMOGLOBIN A1C
Hgb A1c MFr Bld: 6 % of total Hgb — ABNORMAL HIGH (ref <5.7)
Mean Plasma Glucose: 126 mg/dL
eAG (mmol/L): 7 mmol/L

## 2022-02-12 LAB — LIPID PANEL
Cholesterol: 109 mg/dL (ref <200)
HDL: 46 mg/dL (ref >=40)
LDL Cholesterol (Calc): 43 mg/dL (calc)
Non-HDL Cholesterol (Calc): 63 mg/dL (calc) (ref <130)
Total CHOL/HDL Ratio: 2.4 (calc) (ref <5.0)
Triglycerides: 114 mg/dL (ref <150)

## 2022-02-12 LAB — PSA: PSA: 0.66 ng/mL (ref <=4.00)

## 2022-02-14 ENCOUNTER — Telehealth: Payer: Self-pay | Admitting: Internal Medicine

## 2022-02-14 NOTE — Telephone Encounter (Signed)
Patient called and said Dr Lenord Fellers mentioned if his A1C labs were high then she would have him increased to 2 a day and he wasn't sure If she got a chance to look at results yet or not so he knows what he needs to do. Call back is [phone]

## 2022-02-15 NOTE — Telephone Encounter (Signed)
Patient has been notified via mychart and advised to call with any questions.

## 2022-02-19 NOTE — Patient Instructions (Addendum)
Due for follow-up with Dr. Leone Payor with MRI of the pancreas with history of recurrent pancreatitis.  Blood pressure stable on current regimen.  Hemoglobin A1c is 6% and fasting glucose is 125.  Lipids and PSA are normal.  Continue to watch diet and follow-up in 1 year.

## 2022-02-21 ENCOUNTER — Other Ambulatory Visit: Payer: Self-pay

## 2022-02-21 DIAGNOSIS — K861 Other chronic pancreatitis: Secondary | ICD-10-CM

## 2022-02-25 ENCOUNTER — Encounter: Payer: Self-pay | Admitting: Internal Medicine

## 2022-03-16 ENCOUNTER — Other Ambulatory Visit: Payer: Self-pay | Admitting: Internal Medicine

## 2022-04-11 DIAGNOSIS — H35033 Hypertensive retinopathy, bilateral: Secondary | ICD-10-CM | POA: Diagnosis not present

## 2022-04-11 DIAGNOSIS — H5213 Myopia, bilateral: Secondary | ICD-10-CM | POA: Diagnosis not present

## 2022-05-31 ENCOUNTER — Encounter: Payer: Self-pay | Admitting: Internal Medicine

## 2022-05-31 ENCOUNTER — Ambulatory Visit: Payer: Medicare Other | Admitting: Internal Medicine

## 2022-05-31 VITALS — BP 144/68 | HR 60 | Ht 65.0 in | Wt 171.0 lb

## 2022-05-31 DIAGNOSIS — K862 Cyst of pancreas: Secondary | ICD-10-CM

## 2022-05-31 NOTE — Progress Notes (Unsigned)
David Cantu 66 y.o. 09-13-1955 [id number]  Assessment & Plan:   Encounter Diagnosis  Name Primary?   Cyst of pancreas Yes   Patient is inclined to continue surveillance per radiologist recommendations.  Tiny cyst appears benign and unlikely to develop into anything more serious but MRI will confirm that theory. Orders Placed This Encounter  Procedures   MR ABDOMEN MRCP W WO CONTAST       Subjective:   Chief Complaint: Pancreatic cyst follow-up  HPI Patient has a history of a tiny pancreatic cyst in the tail of the pancreas originally found by CT scanning in 2018 and confirmed by MRI in 2018 and 2019.  Last exam was May 2021 as below.  IMPRESSION: Stable tiny cystic foci in the tail of the pancreas measuring up to 5-6 mm. Recommend continued follow-up in 2 years. This recommendation follows ACR consensus guidelines: Management of Incidental Pancreatic Cysts: A White Paper of the ACR Incidental Findings Committee. J Am Coll Radiol 2017;14:911-923.   He occasionally has some upper abdominal pain very mild.  He is here to discuss repeating MRI.  Wt Readings from Last 3 Encounters:  05/31/22 171 lb (77.6 kg)  02/11/22 162 lb 4 oz (73.6 kg)  09/07/21 168 lb 12 oz (76.5 kg)    Normal colonoscopy 2016  Allergies  Allergen Reactions   Zenpep [Pancrelipase (Lip-Prot-Amyl)] Nausea And Vomiting    VIOLENT VOMITING   Current Meds  Medication Sig   acetaminophen (TYLENOL) 325 MG tablet Take 325-650 mg by mouth at bedtime as needed (for shoulder pain).    aspirin 81 MG chewable tablet Chew 1 tablet (81 mg total) by mouth daily.   carvedilol (COREG) 6.25 MG tablet TAKE 1 TABLET(6.25 MG) BY MOUTH TWICE DAILY WITH A MEAL   ezetimibe (ZETIA) 10 MG tablet TAKE 1 TABLET(10 MG) BY MOUTH DAILY   lisinopril (ZESTRIL) 10 MG tablet TAKE 1 TABLET(10 MG) BY MOUTH DAILY   metFORMIN (GLUCOPHAGE) 500 MG tablet TAKE 1 TABLET(500 MG) BY MOUTH DAILY WITH SUPPER   nitroGLYCERIN  (NITROSTAT) 0.4 MG SL tablet Place 1 tablet (0.4 mg total) under the tongue every 5 (five) minutes as needed for chest pain.   pantoprazole (PROTONIX) 40 MG tablet TAKE 1 TABLET(40 MG) BY MOUTH DAILY   rosuvastatin (CRESTOR) 40 MG tablet TAKE 1 TABLET(40 MG) BY MOUTH DAILY   Past Medical History:  Diagnosis Date   Acute pancreatitis 02/03/2014; 05/12/2016   Arthritis    "shoulders" (05/12/2016)   CAD (coronary artery disease)    a. L&R cath 07/09/15 normal LV function, no MR or AS, normal R heart pressure, No pulmonary HTN, 70% RCA s/p DES   Headache(784.0)    "couple times/month" (05/12/2016)   Heart murmur    Hypertension    Migraine    "might have 5-6/year" (05/12/2016)   Pneumonia 1960; ~ 1992   Pre-diabetes    Ringing in the ears    "continuous ringing in my ears" (05/12/2016)   Shingles    Past Surgical History:  Procedure Laterality Date   CARDIAC CATHETERIZATION N/A 07/09/2015   Procedure: Right/Left Heart Cath and Coronary Angiography;  Surgeon: Jake Bathe, MD;  Location: [facility] INVASIVE CV LAB;  Service: Cardiovascular;  Laterality: N/A;   CARDIAC CATHETERIZATION N/A 07/09/2015   Procedure: Intravascular Pressure Wire/FFR Study;  Surgeon: Kathleene Hazel, MD;  Location: [facility] INVASIVE CV LAB;  Service: Cardiovascular;  Laterality: N/A;   CARDIAC CATHETERIZATION N/A 07/09/2015   Procedure: Coronary Stent Intervention;  Surgeon: Cristal Deer  Santina Evans, MD;  Location: [place] CV LAB;  Service: Cardiovascular;  Laterality: N/A;  Distal RCA   SHOULDER ARTHROSCOPY W/ ROTATOR CUFF REPAIR Left 2011   TRACHEOSTOMY  1960   TRACHEOSTOMY CLOSURE  1960   Social History   Social History Narrative   Right handed   Drinks caffeine   Two story home   family history includes Lung cancer in his father and mother.   Review of Systems As above  Objective:   Physical Exam BP (!) 144/68   Pulse 60   Ht 5\' 5"  (1.651 m)   Wt 171 lb (77.6 kg)   BMI 28.46 kg/m

## 2022-05-31 NOTE — Patient Instructions (Addendum)
You have been scheduled for an MRI at_______________on ________________. Your appointment time is ________________________. Please arrive to admitting (at main entrance of the hospital) 30 minutes prior to your appointment time for registration purposes. Please make certain not to have anything to eat or drink 6 hours prior to your test. In addition, if you have any metal in your body, have a pacemaker or defibrillator, please be sure to let your ordering physician know. This test typically takes 45 minutes to 1 hour to complete. Should you need to reschedule, please call [phone] to do so.  You will be contacted by [facility] in the next 2 days to arrange a _______________.  The number on your caller ID will be [phone], please answer when they call.  If you have not heard from them in 2 days please call [phone] to schedule.      Due to recent changes in healthcare laws, you may see the results of your imaging and laboratory studies on MyChart before your provider has had a chance to review them.  We understand that in some cases there may be results that are confusing or concerning to you. Not all laboratory results come back in the same time frame and the provider may be waiting for multiple results in order to interpret others.  Please give [place] 48 hours in order for your provider to thoroughly review all the results before contacting the office for clarification of your results.    We have provided you with diet handouts to read.  I appreciate the opportunity to care for you. Silvano Rusk, MD, [facility]

## 2022-06-08 ENCOUNTER — Ambulatory Visit (HOSPITAL_COMMUNITY)
Admission: RE | Admit: 2022-06-08 | Discharge: 2022-06-08 | Disposition: A | Discharge status: Home / Self Care | Payer: Medicare Other | Source: Ambulatory Visit | Attending: Internal Medicine | Admitting: Internal Medicine

## 2022-06-08 ENCOUNTER — Other Ambulatory Visit (HOSPITAL_COMMUNITY): Payer: Self-pay | Admitting: Internal Medicine

## 2022-06-08 DIAGNOSIS — K862 Cyst of pancreas: Secondary | ICD-10-CM | POA: Diagnosis not present

## 2022-06-08 DIAGNOSIS — K7689 Other specified diseases of liver: Secondary | ICD-10-CM | POA: Diagnosis not present

## 2022-06-08 DIAGNOSIS — R935 Abnormal findings on diagnostic imaging of other abdominal regions, including retroperitoneum: Secondary | ICD-10-CM | POA: Diagnosis not present

## 2022-06-08 MED ORDER — GADOBUTROL 1 MMOL/ML IV SOLN
7.5000 mL | Freq: Once | INTRAVENOUS | Status: AC | PRN
  Administered 2022-06-08: 7.5 mL via INTRAVENOUS

## 2022-06-14 ENCOUNTER — Other Ambulatory Visit: Payer: Self-pay

## 2022-06-14 MED ORDER — METFORMIN HCL 500 MG PO TABS: ORAL_TABLET | ORAL | 0 refills | Status: DC

## 2022-07-26 ENCOUNTER — Other Ambulatory Visit: Payer: Self-pay | Admitting: Cardiology

## 2022-08-12 ENCOUNTER — Other Ambulatory Visit: Payer: Self-pay | Admitting: Cardiology

## 2022-09-14 ENCOUNTER — Other Ambulatory Visit: Payer: Self-pay | Admitting: Internal Medicine

## 2022-10-07 ENCOUNTER — Other Ambulatory Visit: Payer: Self-pay

## 2022-10-07 ENCOUNTER — Telehealth: Payer: Self-pay | Admitting: Cardiology

## 2022-10-07 ENCOUNTER — Encounter: Payer: Self-pay | Admitting: Cardiology

## 2022-10-07 ENCOUNTER — Other Ambulatory Visit: Payer: Self-pay | Admitting: Cardiology

## 2022-10-07 MED ORDER — CARVEDILOL 6.25 MG PO TABS: 6.2500 mg | ORAL_TABLET | Freq: Two times a day (BID) | ORAL | 1 refills | Status: DC

## 2022-10-07 MED ORDER — EZETIMIBE 10 MG PO TABS: ORAL_TABLET | ORAL | 1 refills | Status: DC

## 2022-10-07 MED ORDER — LISINOPRIL 10 MG PO TABS: ORAL_TABLET | ORAL | 1 refills | Status: DC

## 2022-10-07 NOTE — Telephone Encounter (Signed)
*  STAT* If patient is at the pharmacy, call can be transferred to refill team.   1. Which medications need to be refilled? (please list name of each medication and dose if known)   carvedilol (COREG) 6.25 MG tablet  ezetimibe (ZETIA) 10 MG tablet  lisinopril (ZESTRIL) 10 MG tablet   rosuvastatin (CRESTOR) 40 MG tablet   2. Which pharmacy/location (including street and city if local pharmacy) is medication to be sent to?  [id number] - [place], [place] - [address] [place] [address]    3. Do they need a 30 day or 90 day supply? 90  Patient has been scheduled w/ Dr. Marlou Porch and is added to waitlist for sooner appt.

## 2022-10-07 NOTE — Telephone Encounter (Signed)
Error

## 2022-10-07 NOTE — Telephone Encounter (Signed)
Pt's medications were sent to pt's pharmacy as requested. Confirmation received.  

## 2022-10-14 ENCOUNTER — Telehealth: Payer: Self-pay | Admitting: Internal Medicine

## 2022-10-14 NOTE — Telephone Encounter (Signed)
David Cantu [id number]  [name] called to say for the last few weeks he has been having heal pain and pressure, plus starting to walk with a limp.

## 2022-10-14 NOTE — Telephone Encounter (Signed)
Called patient and let him know Dr Renold Genta said he should call and see orthopedic doctor of his choice. That he should not need a referral. He verbalized understanding.

## 2022-10-18 DIAGNOSIS — M5431 Sciatica, right side: Secondary | ICD-10-CM | POA: Diagnosis not present

## 2022-10-18 DIAGNOSIS — M79671 Pain in right foot: Secondary | ICD-10-CM | POA: Diagnosis not present

## 2022-10-23 ENCOUNTER — Other Ambulatory Visit: Payer: Self-pay | Admitting: Cardiology

## 2022-10-29 ENCOUNTER — Other Ambulatory Visit: Payer: Self-pay | Admitting: Cardiology

## 2022-11-01 DIAGNOSIS — M25571 Pain in right ankle and joints of right foot: Secondary | ICD-10-CM | POA: Diagnosis not present

## 2022-11-01 DIAGNOSIS — M25671 Stiffness of right ankle, not elsewhere classified: Secondary | ICD-10-CM | POA: Diagnosis not present

## 2022-11-07 DIAGNOSIS — M25571 Pain in right ankle and joints of right foot: Secondary | ICD-10-CM | POA: Diagnosis not present

## 2022-11-07 DIAGNOSIS — M25671 Stiffness of right ankle, not elsewhere classified: Secondary | ICD-10-CM | POA: Diagnosis not present

## 2022-11-15 DIAGNOSIS — M25571 Pain in right ankle and joints of right foot: Secondary | ICD-10-CM | POA: Diagnosis not present

## 2022-11-15 DIAGNOSIS — M79671 Pain in right foot: Secondary | ICD-10-CM | POA: Diagnosis not present

## 2022-11-15 DIAGNOSIS — M25671 Stiffness of right ankle, not elsewhere classified: Secondary | ICD-10-CM | POA: Diagnosis not present

## 2022-11-21 ENCOUNTER — Telehealth: Payer: Self-pay | Admitting: Pharmacist

## 2022-11-21 NOTE — Progress Notes (Signed)
Patient appearing on report for quality metrics: controlling blood pressure (CBP).  Outreached patient to discuss medication management. Left voicemail for patient to return my call at their convenience.   Roselinda Bahena, PharmD, BCPS Clinical Pharmacist [facility]  

## 2022-11-22 DIAGNOSIS — M25671 Stiffness of right ankle, not elsewhere classified: Secondary | ICD-10-CM | POA: Diagnosis not present

## 2022-11-22 DIAGNOSIS — M25571 Pain in right ankle and joints of right foot: Secondary | ICD-10-CM | POA: Diagnosis not present

## 2022-11-29 DIAGNOSIS — M25571 Pain in right ankle and joints of right foot: Secondary | ICD-10-CM | POA: Diagnosis not present

## 2022-11-29 DIAGNOSIS — M25671 Stiffness of right ankle, not elsewhere classified: Secondary | ICD-10-CM | POA: Diagnosis not present

## 2022-12-13 DIAGNOSIS — M25571 Pain in right ankle and joints of right foot: Secondary | ICD-10-CM | POA: Diagnosis not present

## 2022-12-13 DIAGNOSIS — M25671 Stiffness of right ankle, not elsewhere classified: Secondary | ICD-10-CM | POA: Diagnosis not present

## 2022-12-14 ENCOUNTER — Encounter: Payer: Self-pay | Admitting: Cardiology

## 2022-12-14 ENCOUNTER — Ambulatory Visit: Payer: Medicare Other | Attending: Cardiology | Admitting: Cardiology

## 2022-12-14 VITALS — BP 132/78 | HR 55 | Ht 65.0 in | Wt 171.4 lb

## 2022-12-14 DIAGNOSIS — I2583 Coronary atherosclerosis due to lipid rich plaque: Secondary | ICD-10-CM | POA: Diagnosis not present

## 2022-12-14 DIAGNOSIS — I10 Essential (primary) hypertension: Secondary | ICD-10-CM

## 2022-12-14 DIAGNOSIS — I2510 Atherosclerotic heart disease of native coronary artery without angina pectoris: Secondary | ICD-10-CM
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
## 2022-12-14 NOTE — Patient Instructions (Signed)
Medication Instructions:  The current medical regimen is effective;  continue present plan and medications.  *If you need a refill on your cardiac medications before your next appointment, please call your pharmacy*  Follow-Up: At [facility], you and your health needs are our priority.  As part of our continuing mission to provide you with exceptional heart care, we have created designated Provider Care Teams.  These Care Teams include your primary Cardiologist (physician) and Advanced Practice Providers (APPs -  Physician Assistants and Nurse Practitioners) who all work together to provide you with the care you need, when you need it.  We recommend signing up for the patient portal called "MyChart".  Sign up information is provided on this After Visit Summary.  MyChart is used to connect with patients for Virtual Visits (Telemedicine).  Patients are able to view lab/test results, encounter notes, upcoming appointments, etc.  Non-urgent messages can be sent to your provider as well.   To learn more about what you can do with MyChart, go to [email or web address].    Your next appointment:   1 year(s)  Provider:   Mark Skains, MD      

## 2022-12-22 DIAGNOSIS — M25671 Stiffness of right ankle, not elsewhere classified: Secondary | ICD-10-CM | POA: Diagnosis not present

## 2022-12-22 DIAGNOSIS — M25571 Pain in right ankle and joints of right foot: Secondary | ICD-10-CM | POA: Diagnosis not present

## 2022-12-30 DIAGNOSIS — M25571 Pain in right ankle and joints of right foot: Secondary | ICD-10-CM | POA: Diagnosis not present

## 2022-12-30 DIAGNOSIS — M25671 Stiffness of right ankle, not elsewhere classified: Secondary | ICD-10-CM | POA: Diagnosis not present

## 2023-01-03 ENCOUNTER — Other Ambulatory Visit: Payer: Self-pay | Admitting: Internal Medicine

## 2023-01-06 ENCOUNTER — Other Ambulatory Visit: Payer: Self-pay | Admitting: Cardiology

## 2023-01-16 ENCOUNTER — Telehealth: Payer: Self-pay

## 2023-01-16 ENCOUNTER — Ambulatory Visit (INDEPENDENT_AMBULATORY_CARE_PROVIDER_SITE_OTHER): Payer: Medicare Other | Admitting: Internal Medicine

## 2023-01-16 ENCOUNTER — Encounter: Payer: Self-pay | Admitting: Internal Medicine

## 2023-01-16 VITALS — BP 138/82 | HR 61 | Temp 97.4°F | Ht 65.0 in | Wt 172.2 lb

## 2023-01-16 DIAGNOSIS — H6691 Otitis media, unspecified, right ear: Secondary | ICD-10-CM

## 2023-01-16 DIAGNOSIS — H60331 Swimmer's ear, right ear: Secondary | ICD-10-CM | POA: Diagnosis not present

## 2023-01-16 DIAGNOSIS — L819 Disorder of pigmentation, unspecified: Secondary | ICD-10-CM

## 2023-01-16 MED ORDER — AMOXICILLIN 500 MG PO CAPS: 500.0000 mg | ORAL_CAPSULE | Freq: Three times a day (TID) | ORAL | 0 refills | Status: DC

## 2023-01-16 MED ORDER — NEOMYCIN-POLYMYXIN-HC 3.5-10000-1 OT SOLN: OTIC | 0 refills | Status: DC

## 2023-01-16 NOTE — Telephone Encounter (Signed)
Appt today at 4

## 2023-01-16 NOTE — Progress Notes (Signed)
Patient Care Team: [name], MD as PCP - General (Internal Medicine) Jake Bathe, MD as PCP - Cardiology (Cardiology)  Visit Date: 01/16/23  Subjective:    Patient ID: David Cantu , Male   DOB: 1955/10/26, 67 y.o.    MRN: [id number]   67 y.o. Male presents today for right ear discomfort. Believes this is related to swimming. Hearing is diminished.   Past Medical History:  Diagnosis Date   Acute pancreatitis 02/03/2014; 05/12/2016   Arthritis    "shoulders" (05/12/2016)   CAD (coronary artery disease)    a. L&R cath 07/09/15 normal LV function, no MR or AS, normal R heart pressure, No pulmonary HTN, 70% RCA s/p DES   Headache(784.0)    "couple times/month" (05/12/2016)   Heart murmur    Hypertension    Migraine    "might have 5-6/year" (05/12/2016)   Pneumonia 1960; ~ 1992   Pre-diabetes    Ringing in the ears    "continuous ringing in my ears" (05/12/2016)   Shingles      Family History  Problem Relation Age of Onset   Lung cancer Mother    Lung cancer Father    Cardiomyopathy Brother    Colon cancer Neg Hx     Social History   Social History Narrative   Right handed   Drinks caffeine   Two story home      Review of Systems  Constitutional:  Negative for fever and malaise/fatigue.  HENT:  Positive for ear pain. Negative for congestion.   Eyes:  Negative for blurred vision.  Respiratory:  Negative for cough and shortness of breath.   Cardiovascular:  Negative for chest pain, palpitations and leg swelling.  Gastrointestinal:  Negative for vomiting.  Musculoskeletal:  Negative for back pain.  Skin:  Negative for rash.  Neurological:  Negative for loss of consciousness and headaches.        Objective:   Vitals: BP 138/82   Pulse 61   Temp (!) 97.4 F (36.3 C) (Tympanic)   Ht 5\' 5"  (1.651 m)   Wt 172 lb 4 oz (78.1 kg)   SpO2 99%   BMI 28.66 kg/m    Physical Exam Vitals and nursing note reviewed.  Constitutional:      General:  He is not in acute distress.    Appearance: Normal appearance. He is not ill-appearing.  HENT:     Head: Normocephalic and atraumatic.     Ears:     Comments: Right ear canal is red, TM yellow with mucus appearance. Left ear canal clear, TM dull.    Mouth/Throat:     Comments: Pharynx clear. Pulmonary:     Effort: Pulmonary effort is normal.  Skin:    General: Skin is warm and dry.  Neurological:     Mental Status: He is alert and oriented to person, place, and time. Mental status is at baseline.  Psychiatric:        Mood and Affect: Mood normal.        Behavior: Behavior normal.        Thought Content: Thought content normal.        Judgment: Judgment normal.       Results:   Studies obtained and personally reviewed by me:   Labs:       Component Value Date/Time   NA 138 02/08/2022 1212   K 4.9 02/08/2022 1212   CL 102 02/08/2022 1212   CO2 29 02/08/2022 1212  GLUCOSE 125 (H) 02/08/2022 1212   BUN 15 02/08/2022 1212   CREATININE 1.26 02/08/2022 1212   CALCIUM 9.8 02/08/2022 1212   PROT 6.9 02/08/2022 1212   ALBUMIN 4.3 09/19/2016 2042   AST 25 02/08/2022 1212   ALT 29 02/08/2022 1212   ALKPHOS 52 09/19/2016 2042   BILITOT 0.7 02/08/2022 1212   GFRNONAA 66 12/07/2020 1104   GFRAA 76 12/07/2020 1104     Lab Results  Component Value Date   WBC 6.3 02/08/2022   HGB 13.5 02/08/2022   HCT 40.7 02/08/2022   MCV 86.4 02/08/2022   PLT 215 02/08/2022    Lab Results  Component Value Date   CHOL 109 02/08/2022   HDL 46 02/08/2022   LDLCALC 43 02/08/2022   TRIG 114 02/08/2022   CHOLHDL 2.4 02/08/2022    Lab Results  Component Value Date   HGBA1C 6.0 (H) 02/08/2022     No results found for: "TSH"   Lab Results  Component Value Date   PSA 0.66 02/08/2022   PSA 0.70 12/07/2020   PSA 1.3 09/03/2019      Assessment & Plan:   Right otitis media: prescribed Amoxicillin 500 mg three times daily for 7 days, cortisporin four drops right ear four times  daily for 5-7 days. Advised to avoid swimming for several days. Contact us if symptoms do not improve.  Atypical skin lesion- needs Dermatology evaluation of lesion right lateral thorax  I,Alexander Ruley,acting as a scribe for [name], MD.,have documented all relevant documentation on the behalf of [name], MD,as directed by  [name], MD while in the presence of [name], MD.   I, [name], MD, have reviewed all documentation for this visit. The documentation on 01/28/23 for the exam, diagnosis, procedures, and orders are all accurate and complete.

## 2023-01-16 NOTE — Telephone Encounter (Signed)
Patient would like an appt for right ear pain. He is unable to hear out of it. No drainage and no fever. No other symptoms. Please advise.

## 2023-01-16 NOTE — Patient Instructions (Addendum)
Have Dermatologist check skin lesion right lateral thorax.  Have prescribed Cortisporin otic suspension 4 drops in right ear 4 times a day for 5 to 7 days.  No swimming for at least a week to 10 days.  Amoxicillin 500 mg 3 times a day for 7 days.

## 2023-01-24 DIAGNOSIS — M25671 Stiffness of right ankle, not elsewhere classified: Secondary | ICD-10-CM | POA: Diagnosis not present

## 2023-01-24 DIAGNOSIS — M25571 Pain in right ankle and joints of right foot: Secondary | ICD-10-CM | POA: Diagnosis not present

## 2023-01-26 DIAGNOSIS — K080 Exfoliation of teeth due to systemic causes: Secondary | ICD-10-CM | POA: Diagnosis not present

## 2023-01-27 ENCOUNTER — Ambulatory Visit: Payer: Medicare Other | Admitting: Cardiology

## 2023-01-27 DIAGNOSIS — M7661 Achilles tendinitis, right leg: Secondary | ICD-10-CM | POA: Diagnosis not present
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
## 2023-02-11 ENCOUNTER — Other Ambulatory Visit: Payer: Self-pay | Admitting: Cardiology

## 2023-02-12 DIAGNOSIS — M25571 Pain in right ankle and joints of right foot: Secondary | ICD-10-CM | POA: Diagnosis not present

## 2023-02-13 ENCOUNTER — Other Ambulatory Visit: Payer: Medicare Other

## 2023-02-13 DIAGNOSIS — I10 Essential (primary) hypertension: Secondary | ICD-10-CM

## 2023-02-13 DIAGNOSIS — R7302 Impaired glucose tolerance (oral): Secondary | ICD-10-CM | POA: Diagnosis not present

## 2023-02-13 DIAGNOSIS — Z125 Encounter for screening for malignant neoplasm of prostate: Secondary | ICD-10-CM | POA: Diagnosis not present

## 2023-02-13 DIAGNOSIS — E782 Mixed hyperlipidemia: Secondary | ICD-10-CM

## 2023-02-13 LAB — CBC WITH DIFFERENTIAL/PLATELET
Basophils Relative: 0.4 %
Eosinophils Relative: 7.1 %
Hemoglobin: 13.7 g/dL (ref 13.2–17.1)
MCH: 28 pg (ref 27.0–33.0)
MCV: 84.9 fL (ref 80.0–100.0)
Monocytes Relative: 7.5 %
Platelets: 234 10*3/uL (ref 140–400)

## 2023-02-13 NOTE — Progress Notes (Signed)
 Lab only 

## 2023-02-14 ENCOUNTER — Encounter: Payer: Self-pay | Admitting: Internal Medicine

## 2023-02-14 ENCOUNTER — Ambulatory Visit (INDEPENDENT_AMBULATORY_CARE_PROVIDER_SITE_OTHER): Payer: Medicare Other | Admitting: Internal Medicine

## 2023-02-14 VITALS — BP 124/78 | HR 64 | Resp 16 | Ht 64.5 in | Wt 169.0 lb

## 2023-02-14 DIAGNOSIS — I10 Essential (primary) hypertension: Secondary | ICD-10-CM | POA: Diagnosis not present

## 2023-02-14 DIAGNOSIS — Z8679 Personal history of other diseases of the circulatory system: Secondary | ICD-10-CM

## 2023-02-14 DIAGNOSIS — Z8719 Personal history of other diseases of the digestive system: Secondary | ICD-10-CM

## 2023-02-14 DIAGNOSIS — E119 Type 2 diabetes mellitus without complications: Secondary | ICD-10-CM

## 2023-02-14 DIAGNOSIS — K219 Gastro-esophageal reflux disease without esophagitis: Secondary | ICD-10-CM

## 2023-02-14 DIAGNOSIS — E786 Lipoprotein deficiency: Secondary | ICD-10-CM

## 2023-02-14 DIAGNOSIS — Z0000 Encounter for general adult medical examination without abnormal findings: Secondary | ICD-10-CM

## 2023-02-14 DIAGNOSIS — Z9889 Other specified postprocedural states: Secondary | ICD-10-CM

## 2023-02-14 LAB — CBC WITH DIFFERENTIAL/PLATELET
Absolute Monocytes: 518 cells/uL (ref 200–950)
Basophils Absolute: 28 cells/uL (ref 0–200)
Eosinophils Absolute: 490 cells/uL (ref 15–500)
HCT: 41.6 % (ref 38.5–50.0)
Lymphs Abs: 863 cells/uL (ref 850–3900)
MCHC: 32.9 g/dL (ref 32.0–36.0)
MPV: 10.6 fL (ref 7.5–12.5)
Neutro Abs: 5003 cells/uL (ref 1500–7800)
Neutrophils Relative %: 72.5 %
RBC: 4.9 10*6/uL (ref 4.20–5.80)
RDW: 12.4 % (ref 11.0–15.0)
Total Lymphocyte: 12.5 %
WBC: 6.9 10*3/uL (ref 3.8–10.8)

## 2023-02-14 LAB — POCT URINALYSIS DIPSTICK
Bilirubin, UA: NEGATIVE
Blood, UA: NEGATIVE
Glucose, UA: NEGATIVE
Ketones, UA: NEGATIVE
Leukocytes, UA: NEGATIVE
Nitrite, UA: NEGATIVE
Protein, UA: NEGATIVE
Spec Grav, UA: 1.015 (ref 1.010–1.025)
Urobilinogen, UA: 0.2 E.U./dL
pH, UA: 7.5 (ref 5.0–8.0)

## 2023-02-14 LAB — COMPLETE METABOLIC PANEL WITH GFR
AG Ratio: 1.9 (calc) (ref 1.0–2.5)
ALT: 36 U/L (ref 9–46)
AST: 28 U/L (ref 10–35)
Albumin: 4.7 g/dL (ref 3.6–5.1)
Alkaline phosphatase (APISO): 44 U/L (ref 35–144)
BUN: 19 mg/dL (ref 7–25)
CO2: 29 mmol/L (ref 20–32)
Calcium: 9.7 mg/dL (ref 8.6–10.3)
Chloride: 101 mmol/L (ref 98–110)
Creat: 1.24 mg/dL (ref 0.70–1.35)
Globulin: 2.5 g/dL (calc) (ref 1.9–3.7)
Glucose, Bld: 133 mg/dL — ABNORMAL HIGH (ref 65–99)
Potassium: 5.2 mmol/L (ref 3.5–5.3)
Sodium: 138 mmol/L (ref 135–146)
Total Bilirubin: 0.7 mg/dL (ref 0.2–1.2)
Total Protein: 7.2 g/dL (ref 6.1–8.1)
eGFR: 63 mL/min/{1.73_m2} (ref >=60)

## 2023-02-14 LAB — LIPID PANEL
Cholesterol: 100 mg/dL (ref <200)
HDL: 43 mg/dL (ref >=40)
LDL Cholesterol (Calc): 34 mg/dL (calc)
Non-HDL Cholesterol (Calc): 57 mg/dL (calc) (ref <130)
Total CHOL/HDL Ratio: 2.3 (calc) (ref <5.0)
Triglycerides: 149 mg/dL (ref <150)

## 2023-02-14 LAB — HEMOGLOBIN A1C
Hgb A1c MFr Bld: 7 % of total Hgb — ABNORMAL HIGH (ref <5.7)
Mean Plasma Glucose: 154 mg/dL
eAG (mmol/L): 8.5 mmol/L

## 2023-02-14 LAB — PSA: PSA: 0.66 ng/mL (ref <=4.00)

## 2023-02-14 MED ORDER — METFORMIN HCL 500 MG PO TABS: 500.0000 mg | ORAL_TABLET | Freq: Two times a day (BID) | ORAL | 3 refills | Status: DC

## 2023-02-15 LAB — MICROALBUMIN / CREATININE URINE RATIO
Creatinine, Urine: 141 mg/dL (ref 20–320)
Microalb Creat Ratio: 9 mg/g creat (ref <30)
Microalb, Ur: 1.3 mg/dL

## 2023-02-17 DIAGNOSIS — M7661 Achilles tendinitis, right leg: Secondary | ICD-10-CM | POA: Diagnosis not present

## 2023-02-20 ENCOUNTER — Telehealth: Payer: Self-pay | Admitting: *Deleted

## 2023-02-20 ENCOUNTER — Telehealth: Payer: Self-pay | Admitting: Internal Medicine

## 2023-02-20 NOTE — Telephone Encounter (Signed)
Pt has been scheduled for tele pre op appt 02/28/23. Med rec and consent are done.

## 2023-02-20 NOTE — Patient Instructions (Signed)
Patient is considering surgery for bony prominence right Achilles tendon area being seen by [facility].  Hypertension is stable with medication.  Hyperlipidemia is stable.  He continues to be followed by cardiology.  Would like to see glucose control better.  Hemoglobin A1c was 7% up from 6% last year.  Unable to exercise much due to foot pain.  Increasing metformin to 500 mg twice daily.  Continue pantoprazole 40 mg daily for GE reflux.  Discussed shingles vaccine.  He will receive flu vaccine this coming fall.  Recommend repeat serum creatinine when he is well-hydrated.  Check hemoglobin A1c in 6 months.

## 2023-02-20 NOTE — Telephone Encounter (Signed)
Pt has been scheduled for tele pre op appt 02/28/23. Med rec and consent are done.     Patient Consent for Virtual Visit        David Cantu has provided verbal consent on 02/20/2023 for a virtual visit (video or telephone).   CONSENT FOR VIRTUAL VISIT FOR:  David Cantu  By participating in this virtual visit I agree to the following:  I hereby voluntarily request, consent and authorize [facility] and its employed or contracted physicians, physician assistants, nurse practitioners or other licensed health care professionals (the Practitioner), to provide me with telemedicine health care services (the "Services") as deemed necessary by the treating Practitioner. I acknowledge and consent to receive the Services by the Practitioner via telemedicine. I understand that the telemedicine visit will involve communicating with the Practitioner through live audiovisual communication technology and the disclosure of certain medical information by electronic transmission. I acknowledge that I have been given the opportunity to request an in-person assessment or other available alternative prior to the telemedicine visit and am voluntarily participating in the telemedicine visit.  I understand that I have the right to withhold or withdraw my consent to the use of telemedicine in the course of my care at any time, without affecting my right to future care or treatment, and that the Practitioner or I may terminate the telemedicine visit at any time. I understand that I have the right to inspect all information obtained and/or recorded in the course of the telemedicine visit and may receive copies of available information for a reasonable fee.  I understand that some of the potential risks of receiving the Services via telemedicine include:  Delay or interruption in medical evaluation due to technological equipment failure or disruption; Information transmitted may not be sufficient (e.g. poor  resolution of images) to allow for appropriate medical decision making by the Practitioner; and/or  In rare instances, security protocols could fail, causing a breach of personal health information.  Furthermore, I acknowledge that it is my responsibility to provide information about my medical history, conditions and care that is complete and accurate to the best of my ability. I acknowledge that Practitioner's advice, recommendations, and/or decision may be based on factors not within their control, such as incomplete or inaccurate data provided by me or distortions of diagnostic images or specimens that may result from electronic transmissions. I understand that the practice of medicine is not an exact science and that Practitioner makes no warranties or guarantees regarding treatment outcomes. I acknowledge that a copy of this consent can be made available to me via my patient portal [facility] MyChart), or I can request a printed copy by calling the office of [facility].    I understand that my insurance will be billed for this visit.   I have read or had this consent read to me. I understand the contents of this consent, which adequately explains the benefits and risks of the Services being provided via telemedicine.  I have been provided ample opportunity to ask questions regarding this consent and the Services and have had my questions answered to my satisfaction. I give my informed consent for the services to be provided through the use of telemedicine in my medical care

## 2023-02-20 NOTE — Telephone Encounter (Signed)
   Name: David Cantu  DOB: Jun 27, 1956  MRN: [id number]  Primary Cardiologist: Donato Schultz, MD   Preoperative team, please contact this patient and set up a phone call appointment for further preoperative risk assessment. Please obtain consent and complete medication review. Thank you for your help.  I confirm that guidance regarding antiplatelet and oral anticoagulation therapy has been completed and, if necessary, noted below. Need to hold ASA.    Joni Reining, NP 02/20/2023, 11:34 AM [facility]

## 2023-02-20 NOTE — Telephone Encounter (Signed)
   Pre-operative Risk Assessment    Patient Name: David Cantu  DOB: 09-21-1955 MRN: [id number]      Request for Surgical Clearance    Procedure:   RIGHT ACHILLES DEBRIDEMENT, CALCANEUS EXOSTECTOMY, POSSIBLE TENDON TRANSFER  Date of Surgery:  Clearance TBD LOOKING TO HAVE SURGERY IN SEPTEMBER 2024                                Surgeon:  DR. Valli Glance  Surgeon's Group or Practice Name:  [facility] Phone number:  [phone] Fax number:  [phone]   Type of Clearance Requested:   - Medical ; ASA    Type of Anesthesia:   CHOICE   Additional requests/questions:    Elpidio Anis   02/20/2023, 10:19 AM

## 2023-02-20 NOTE — Telephone Encounter (Signed)
Surgery Clearance Form from  Dr Netta Cedars  Contact person Kenn File - fax [phone] . Procedure: Right achilles debridement, calcaneus exostectomy, possible tendon transfer  Surgery in September  CPE 02/14/2023  Re doing lab BUN-creatinine on 03/09/23

## 2023-02-27 DIAGNOSIS — M7661 Achilles tendinitis, right leg: Secondary | ICD-10-CM | POA: Diagnosis not present

## 2023-02-28 ENCOUNTER — Ambulatory Visit: Payer: Medicare Other | Attending: Cardiology

## 2023-02-28 DIAGNOSIS — Z01810 Encounter for preprocedural cardiovascular examination: Secondary | ICD-10-CM | POA: Diagnosis not present

## 2023-02-28 NOTE — Progress Notes (Signed)
Virtual Visit via Telephone Note   Because of David Cantu's co-morbid illnesses, he is at least at moderate risk for complications without adequate follow up.  This format is felt to be most appropriate for this patient at this time.  The patient did not have access to video technology/had technical difficulties with video requiring transitioning to audio format only (telephone).  All issues noted in this document were discussed and addressed.  No physical exam could be performed with this format.  Please refer to the patient's chart for his consent to telehealth for [facility].  Evaluation Performed:  Preoperative cardiovascular risk assessment _____________   Date:  02/28/2023   Patient ID:  David Cantu, DOB [birth year], MRN [id number] Patient Location:  Home Provider location:   Office  Primary Care Provider:  [name], MD Primary Cardiologist:  Donato Schultz, MD  Chief Complaint / Patient Profile   67 y.o. y/o male with a h/o CAD s/p DES/PCI 07/2015, HTN, HLD near-syncope who is pending right Achilles debridement possible tendon repair and presents today for telephonic preoperative cardiovascular risk assessment.  History of Present Illness    David Cantu is a 67 y.o. male who presents via audio/video conferencing for a telehealth visit today.  Pt was last seen in cardiology clinic on 12/14/2022 by Dr. Anne Fu. At that time Dusten Lachat was doing well with no complaints of chest pain or shortness of breath.  He reported exercising and going to the gym only complaining of sciatica. The patient is now pending procedure as outlined above. Since his last visit, he has been doing well from a cardiac perspective.  He has suffered foot and ankle pain since his visit in May and is currently undergoing procedure for surgical repair.  He denies chest pain, shortness of breath, lower extremity edema, fatigue, palpitations, melena, hematuria, hemoptysis,  diaphoresis, weakness, presyncope, syncope, orthopnea, and PND.    Past Medical History    Past Medical History:  Diagnosis Date   Acute pancreatitis 02/03/2014; 05/12/2016   Arthritis    "shoulders" (05/12/2016)   CAD (coronary artery disease)    a. L&R cath 07/09/15 normal LV function, no MR or AS, normal R heart pressure, No pulmonary HTN, 70% RCA s/p DES   Headache(784.0)    "couple times/month" (05/12/2016)   Heart murmur    Hypertension    Migraine    "might have 5-6/year" (05/12/2016)   Pneumonia 1960; ~ 1992   Pre-diabetes    Ringing in the ears    "continuous ringing in my ears" (05/12/2016)   Shingles    Past Surgical History:  Procedure Laterality Date   CARDIAC CATHETERIZATION N/A 07/09/2015   Procedure: Right/Left Heart Cath and Coronary Angiography;  Surgeon: Jake Bathe, MD;  Location: [facility] INVASIVE CV LAB;  Service: Cardiovascular;  Laterality: N/A;   CARDIAC CATHETERIZATION N/A 07/09/2015   Procedure: Intravascular Pressure Wire/FFR Study;  Surgeon: Kathleene Hazel, MD;  Location: [facility] INVASIVE CV LAB;  Service: Cardiovascular;  Laterality: N/A;   CARDIAC CATHETERIZATION N/A 07/09/2015   Procedure: Coronary Stent Intervention;  Surgeon: Kathleene Hazel, MD;  Location: [facility] INVASIVE CV LAB;  Service: Cardiovascular;  Laterality: N/A;  Distal RCA   SHOULDER ARTHROSCOPY W/ ROTATOR CUFF REPAIR Left 2011   TRACHEOSTOMY  1960   TRACHEOSTOMY CLOSURE  1960    Allergies  Allergies  Allergen Reactions   Zenpep [Pancrelipase (Lip-Prot-Amyl)] Nausea And Vomiting    VIOLENT VOMITING    Home Medications    Prior  to Admission medications   Medication Sig Start Date End Date Taking? Authorizing Provider  acetaminophen (TYLENOL) 325 MG tablet Take 325-650 mg by mouth at bedtime as needed (for shoulder pain).     [provider]  aspirin 81 MG chewable tablet Chew 1 tablet (81 mg total) by mouth daily. 07/10/15   Manson Passey, PA  carvedilol  (COREG) 6.25 MG tablet Take 1 tablet (6.25 mg total) by mouth 2 (two) times daily with a meal. 10/07/22   Jake Bathe, MD  ezetimibe (ZETIA) 10 MG tablet TAKE 1 TABLET(10 MG) BY MOUTH DAILY 01/06/23   Jake Bathe, MD  lisinopril (ZESTRIL) 10 MG tablet TAKE 1 TABLET(10 MG) BY MOUTH DAILY 10/24/22   Jake Bathe, MD  meloxicam (MOBIC) 7.5 MG tablet Take 7.5 mg by mouth daily as needed. Patient not taking: Reported on 02/20/2023 02/17/23   [provider]  metFORMIN (GLUCOPHAGE) 500 MG tablet Take 1 tablet (500 mg total) by mouth 2 (two) times daily with a meal. 02/14/23   Baxley, Luanna Cole, MD  nitroGLYCERIN (NITROSTAT) 0.4 MG SL tablet Place 1 tablet (0.4 mg total) under the tongue every 5 (five) minutes as needed for chest pain. Patient not taking: Reported on 02/14/2023 08/24/20   Jake Bathe, MD  pantoprazole (PROTONIX) 40 MG tablet TAKE 1 TABLET(40 MG) BY MOUTH DAILY 08/12/22   Jake Bathe, MD  rosuvastatin (CRESTOR) 40 MG tablet TAKE 1 TABLET(40 MG) BY MOUTH DAILY 02/13/23   Jake Bathe, MD    Physical Exam    Vital Signs:  David Cantu does not have vital signs available for review today.  Given telephonic nature of communication, physical exam is limited. AAOx3. NAD. Normal affect.  Speech and respirations are unlabored.  Accessory Clinical Findings    None  Assessment & Plan    1.  Preoperative Cardiovascular Risk Assessment:  -Patient's RCRI score is 0.9%  The patient affirms he has been doing well without any new cardiac symptoms. They are able to achieve 5 METS without cardiac limitations. Therefore, based on ACC/AHA guidelines, the patient would be at acceptable risk for the planned procedure without further cardiovascular testing. The patient was advised that if he develops new symptoms prior to surgery to contact our office to arrange for a follow-up visit, and he verbalized understanding.   The patient was advised that if he develops new symptoms prior to  surgery to contact our office to arrange for a follow-up visit, and he verbalized understanding.  Patient was advised to hold ASA 81 mg 7 days prior to procedure and should restart postprocedure when hemostasis is achieved and directed by performing provider.  A copy of this note will be routed to requesting surgeon.  Time:   Today, I have spent 6 minutes with the patient with telehealth technology discussing medical history, symptoms, and management plan.     Napoleon Form, Leodis Rains, NP  02/28/2023, 7:00 AM

## 2023-03-03 ENCOUNTER — Other Ambulatory Visit: Payer: Medicare Other

## 2023-03-03 DIAGNOSIS — E119 Type 2 diabetes mellitus without complications: Secondary | ICD-10-CM | POA: Diagnosis not present

## 2023-03-03 LAB — BUN/CREATININE RATIO
BUN: 17 mg/dL (ref 7–25)
Creat: 1.26 mg/dL (ref 0.70–1.35)
eGFR: 62 mL/min/{1.73_m2} (ref >=60)

## 2023-03-03 NOTE — Telephone Encounter (Signed)
Surgery clearance appointment scheduled 03/13/2023

## 2023-03-06 NOTE — Progress Notes (Shared)
    Patient Care Team: [name], MD as PCP - General (Internal Medicine) Jake Bathe, MD as PCP - Cardiology (Cardiology)  Visit Date: 03/06/23  Subjective:    Patient ID: David Cantu , Male   DOB: 10-May-1956, 67 y.o.    MRN: [id number]   67 y.o. Male presents today for surgery clearance regarding   Past Medical History:  Diagnosis Date   Acute pancreatitis 02/03/2014; 05/12/2016   Arthritis    "shoulders" (05/12/2016)   CAD (coronary artery disease)    a. L&R cath 07/09/15 normal LV function, no MR or AS, normal R heart pressure, No pulmonary HTN, 70% RCA s/p DES   Headache(784.0)    "couple times/month" (05/12/2016)   Heart murmur    Hypertension    Migraine    "might have 5-6/year" (05/12/2016)   Pneumonia 1960; ~ 1992   Pre-diabetes    Ringing in the ears    "continuous ringing in my ears" (05/12/2016)   Shingles      Family History  Problem Relation Age of Onset   Lung cancer Mother    Lung cancer Father    Cardiomyopathy Brother    Colon cancer Neg Hx     Social History   Social History Narrative   Right handed   Drinks caffeine   Two story home      ROS      Objective:   Vitals: There were no vitals taken for this visit.   Physical Exam    Results:   Studies obtained and personally reviewed by me:  Imaging, colonoscopy, mammogram, bone density scan, echocardiogram, heart cath, stress test, CT calcium score, etc. ***   Labs:       Component Value Date/Time   NA 138 02/13/2023 1134   K 5.2 02/13/2023 1134   CL 101 02/13/2023 1134   CO2 29 02/13/2023 1134   GLUCOSE 133 (H) 02/13/2023 1134   BUN 17 03/03/2023 1112   CREATININE 1.26 03/03/2023 1112   CALCIUM 9.7 02/13/2023 1134   PROT 7.2 02/13/2023 1134   ALBUMIN 4.3 09/19/2016 2042   AST 28 02/13/2023 1134   ALT 36 02/13/2023 1134   ALKPHOS 52 09/19/2016 2042   BILITOT 0.7 02/13/2023 1134   GFRNONAA 66 12/07/2020 1104   GFRAA 76 12/07/2020 1104     Lab Results   Component Value Date   WBC 6.9 02/13/2023   HGB 13.7 02/13/2023   HCT 41.6 02/13/2023   MCV 84.9 02/13/2023   PLT 234 02/13/2023    Lab Results  Component Value Date   CHOL 100 02/13/2023   HDL 43 02/13/2023   LDLCALC 34 02/13/2023   TRIG 149 02/13/2023   CHOLHDL 2.3 02/13/2023    Lab Results  Component Value Date   HGBA1C 7.0 (H) 02/13/2023     No results found for: "TSH"   Lab Results  Component Value Date   PSA 0.66 02/13/2023   PSA 0.66 02/08/2022   PSA 0.70 12/07/2020   *** delete for male pts  ***    Assessment & Plan:   ***    I,Alexander Ruley,acting as a scribe for [name], MD.,have documented all relevant documentation on the behalf of [name], MD,as directed by  [name], MD while in the presence of [name], MD.   ***

## 2023-03-09 ENCOUNTER — Other Ambulatory Visit: Payer: Medicare Other
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
## 2023-03-13 ENCOUNTER — Ambulatory Visit (INDEPENDENT_AMBULATORY_CARE_PROVIDER_SITE_OTHER): Payer: Medicare Other | Admitting: Internal Medicine

## 2023-03-13 ENCOUNTER — Encounter: Payer: Self-pay | Admitting: Internal Medicine

## 2023-03-13 VITALS — BP 128/80 | HR 65 | Ht 64.5 in | Wt 169.0 lb

## 2023-03-13 DIAGNOSIS — Z8679 Personal history of other diseases of the circulatory system: Secondary | ICD-10-CM | POA: Diagnosis not present

## 2023-03-13 DIAGNOSIS — Z01818 Encounter for other preprocedural examination: Secondary | ICD-10-CM

## 2023-03-13 DIAGNOSIS — I10 Essential (primary) hypertension: Secondary | ICD-10-CM

## 2023-03-13 DIAGNOSIS — E119 Type 2 diabetes mellitus without complications: Secondary | ICD-10-CM

## 2023-03-13 DIAGNOSIS — K219 Gastro-esophageal reflux disease without esophagitis: Secondary | ICD-10-CM

## 2023-03-15 ENCOUNTER — Encounter: Payer: Self-pay | Admitting: Internal Medicine

## 2023-03-15 NOTE — Patient Instructions (Signed)
He has recently increased his Metformin to twice daily. He is able to tolerate twice daily dose now, He will come Monday August 19 to have this checked as this was overlooked on day of visit. BUM/Cr ratio was done but not Hgb AIC.he is cleared for surgery.

## 2023-03-20 ENCOUNTER — Other Ambulatory Visit: Payer: Medicare Other

## 2023-03-20 DIAGNOSIS — E119 Type 2 diabetes mellitus without complications: Secondary | ICD-10-CM

## 2023-03-21 LAB — HEMOGLOBIN A1C
Hgb A1c MFr Bld: 6.6 %{Hb} — ABNORMAL HIGH (ref <5.7)
Mean Plasma Glucose: 143 mg/dL
eAG (mmol/L): 7.9 mmol/L

## 2023-03-23 NOTE — Telephone Encounter (Signed)
Surgery clearance faxed to [facility]

## 2023-03-24 ENCOUNTER — Encounter: Payer: Self-pay | Admitting: Internal Medicine

## 2023-03-24 ENCOUNTER — Telehealth: Payer: Self-pay | Admitting: Internal Medicine

## 2023-03-24 NOTE — Telephone Encounter (Signed)
I LVM for David Cantu to call me back about his Mychart message. He sent message that he is now only taking 1 of the below medications because it is causing his stomach discomfort and terrible gas pains and that he has been only taking 1 for 3 days and stomach and gas pains are much less. I am going to schedule him for 6 month follow up per your last note and labs for hemoglobin A1C   metFORMIN (GLUCOPHAGE) 500 MG tablet

## 2023-03-24 NOTE — Telephone Encounter (Signed)
Spoke with David Cantu scheduled him to come in Jan for 6 mon

## 2023-04-01 ENCOUNTER — Other Ambulatory Visit: Payer: Self-pay | Admitting: Internal Medicine

## 2023-04-01 ENCOUNTER — Other Ambulatory Visit: Payer: Self-pay | Admitting: Cardiology

## 2023-04-19 DIAGNOSIS — M7751 Other enthesopathy of right foot: Secondary | ICD-10-CM | POA: Diagnosis not present

## 2023-04-19 DIAGNOSIS — M7731 Calcaneal spur, right foot: Secondary | ICD-10-CM | POA: Diagnosis not present

## 2023-04-19 DIAGNOSIS — G8918 Other acute postprocedural pain: Secondary | ICD-10-CM | POA: Diagnosis not present

## 2023-04-19 DIAGNOSIS — M7661 Achilles tendinitis, right leg: Secondary | ICD-10-CM | POA: Diagnosis not present

## 2023-04-20 DIAGNOSIS — M7661 Achilles tendinitis, right leg: Secondary | ICD-10-CM | POA: Diagnosis not present

## 2023-04-20 DIAGNOSIS — M7731 Calcaneal spur, right foot: Secondary | ICD-10-CM | POA: Diagnosis not present

## 2023-04-28 DIAGNOSIS — M7661 Achilles tendinitis, right leg: Secondary | ICD-10-CM | POA: Diagnosis not present

## 2023-05-10 ENCOUNTER — Other Ambulatory Visit: Payer: Self-pay | Admitting: Cardiology

## 2023-05-11 ENCOUNTER — Other Ambulatory Visit: Payer: Self-pay | Admitting: Cardiology

## 2023-05-15 ENCOUNTER — Other Ambulatory Visit: Payer: Self-pay | Admitting: Cardiology

## 2023-05-19 DIAGNOSIS — M7661 Achilles tendinitis, right leg: Secondary | ICD-10-CM | POA: Diagnosis not present

## 2023-05-26 DIAGNOSIS — M7661 Achilles tendinitis, right leg: Secondary | ICD-10-CM | POA: Diagnosis not present

## 2023-05-30 ENCOUNTER — Encounter: Payer: Self-pay | Admitting: Pharmacist

## 2023-05-30 NOTE — Progress Notes (Signed)
Pharmacy Quality Measure Review  This patient is appearing on a report for being at risk of failing the adherence measure for hypertension (ACEi/ARB) medications this calendar year.   Medication: lisinopril 10 mg Last fill date: 10/9 for 90 day supply  Insurance report was not up to date. No action needed at this time.   Jarrett Ables, PharmD PGY-1 Pharmacy Resident

## 2023-06-05 ENCOUNTER — Other Ambulatory Visit (HOSPITAL_COMMUNITY): Payer: Self-pay | Admitting: Orthopaedic Surgery

## 2023-06-05 ENCOUNTER — Ambulatory Visit (HOSPITAL_COMMUNITY)
Admission: RE | Admit: 2023-06-05 | Discharge: 2023-06-05 | Disposition: A | Discharge status: Home / Self Care | Payer: Medicare Other | Source: Ambulatory Visit | Attending: Cardiology | Admitting: Cardiology

## 2023-06-05 DIAGNOSIS — M7989 Other specified soft tissue disorders: Secondary | ICD-10-CM | POA: Diagnosis not present

## 2023-06-05 DIAGNOSIS — M79604 Pain in right leg: Secondary | ICD-10-CM | POA: Insufficient documentation

## 2023-06-15 DIAGNOSIS — M25571 Pain in right ankle and joints of right foot: Secondary | ICD-10-CM | POA: Diagnosis not present

## 2023-06-15 DIAGNOSIS — M6281 Muscle weakness (generalized): Secondary | ICD-10-CM | POA: Diagnosis not present

## 2023-06-23 DIAGNOSIS — M6281 Muscle weakness (generalized): Secondary | ICD-10-CM | POA: Diagnosis not present

## 2023-06-23 DIAGNOSIS — M25571 Pain in right ankle and joints of right foot: Secondary | ICD-10-CM | POA: Diagnosis not present

## 2023-06-27 DIAGNOSIS — M25571 Pain in right ankle and joints of right foot: Secondary | ICD-10-CM | POA: Diagnosis not present

## 2023-06-27 DIAGNOSIS — M6281 Muscle weakness (generalized): Secondary | ICD-10-CM | POA: Diagnosis not present

## 2023-07-04 DIAGNOSIS — M25571 Pain in right ankle and joints of right foot: Secondary | ICD-10-CM | POA: Diagnosis not present

## 2023-07-04 DIAGNOSIS — M6281 Muscle weakness (generalized): Secondary | ICD-10-CM | POA: Diagnosis not present

## 2023-07-05 ENCOUNTER — Encounter (HOSPITAL_BASED_OUTPATIENT_CLINIC_OR_DEPARTMENT_OTHER): Payer: Medicare Other | Attending: Internal Medicine | Admitting: Internal Medicine

## 2023-07-05 DIAGNOSIS — I2510 Atherosclerotic heart disease of native coronary artery without angina pectoris: Secondary | ICD-10-CM | POA: Diagnosis not present

## 2023-07-05 DIAGNOSIS — T8131XA Disruption of external operation (surgical) wound, not elsewhere classified, initial encounter: Secondary | ICD-10-CM | POA: Diagnosis not present

## 2023-07-05 DIAGNOSIS — L988 Other specified disorders of the skin and subcutaneous tissue: Secondary | ICD-10-CM | POA: Diagnosis not present

## 2023-07-05 DIAGNOSIS — R7303 Prediabetes: Secondary | ICD-10-CM | POA: Diagnosis not present

## 2023-07-05 DIAGNOSIS — I10 Essential (primary) hypertension: Secondary | ICD-10-CM | POA: Diagnosis not present

## 2023-07-05 DIAGNOSIS — Y839 Surgical procedure, unspecified as the cause of abnormal reaction of the patient, or of later complication, without mention of misadventure at the time of the procedure: Secondary | ICD-10-CM | POA: Diagnosis not present

## 2023-07-05 DIAGNOSIS — L97418 Non-pressure chronic ulcer of right heel and midfoot with other specified severity: Secondary | ICD-10-CM | POA: Diagnosis not present

## 2023-07-05 NOTE — Progress Notes (Signed)
ROHIL, MINZEY ([id number]) [id number] Page 1 of 6 Visit Report for 07/05/2023 Allergy List Details Patient Name: Date of Service: David Cantu, David Cantu 07/05/2023 1:30 PM Medical Record Number: [id number] Patient Account Number: [id number] Date of Birth/Sex: Treating RN: Oct 19, 1955 (67 y.o. Charlean Merl, Lauren Primary Care Erlin Gardella: Marlan Palau Other Clinician: Referring Keston Seever: Treating Luismanuel Corman/Extender: Kaylyn Lim in Treatment: 0 Allergies Active Allergies [facility] Allergy Notes Electronic Signature(s) Signed: 07/05/2023 10:37:04 AM By: Fonnie Mu RN Entered By: Fonnie Mu on 07/05/2023 10:37:04 -------------------------------------------------------------------------------- Arrival Information Details Patient Name: Date of Service: David Cantu, David Cantu 07/05/2023 1:30 PM Medical Record Number: [id number] Patient Account Number: [id number] Date of Birth/Sex: Treating RN: 1956-06-24 (67 y.o. Charlean Merl, Lauren Primary Care Matyas Baisley: Marlan Palau Other Clinician: Referring Wilton Thrall: Treating Jen Benedict/Extender: Kaylyn Lim in Treatment: 0 Visit Information Patient Arrived: Ambulatory Arrival Time: 13:34 Accompanied By: wife Transfer Assistance: None Patient Identification Verified: Yes Secondary Verification Process Completed: Yes Patient Requires Transmission-Based Precautions: No Patient Has Alerts: No Electronic Signature(s) Signed: 07/05/2023 4:12:43 PM By: Fonnie Mu RN Entered By: Fonnie Mu on 07/05/2023 13:36:05 -------------------------------------------------------------------------------- Clinic Level of Care Assessment Details Patient Name: Date of Service: David Cantu, David Cantu 07/05/2023 1:30 PM Medical Record Number: [id number] Patient Account Number: [id number] David Cantu, David Cantu ([id number]) [id number] Page 2 of 6 Date of  Birth/Sex: Treating RN: [birth year] (67 y.o. Charlean Merl, Lauren Primary Care Rolinda Impson: Marlan Palau Other Clinician: Referring Arden Tinoco: Treating Gerritt Galentine/Extender: Kaylyn Lim in Treatment: 0 Clinic Level of Care Assessment Items TOOL 4 Quantity Score X- 1 0 Use when only an EandM is performed on FOLLOW-UP visit ASSESSMENTS - Nursing Assessment / Reassessment X- 1 10 Reassessment of Co-morbidities (includes updates in patient status) X- 1 5 Reassessment of Adherence to Treatment Plan ASSESSMENTS - Wound and Skin A ssessment / Reassessment X - Simple Wound Assessment / Reassessment - one wound 1 5 []  - 0 Complex Wound Assessment / Reassessment - multiple wounds []  - 0 Dermatologic / Skin Assessment (not related to wound area) ASSESSMENTS - Focused Assessment []  - 0 Circumferential Edema Measurements - multi extremities []  - 0 Nutritional Assessment / Counseling / Intervention []  - 0 Lower Extremity Assessment (monofilament, tuning fork, pulses) []  - 0 Peripheral Arterial Disease Assessment (using hand held doppler) ASSESSMENTS - Ostomy and/or Continence Assessment and Care []  - 0 Incontinence Assessment and Management []  - 0 Ostomy Care Assessment and Management (repouching, etc.) PROCESS - Coordination of Care X - Simple Patient / Family Education for ongoing care 1 15 []  - 0 Complex (extensive) Patient / Family Education for ongoing care X- 1 10 Staff obtains Chiropractor, Records, T Results / Process Orders est []  - 0 Staff telephones HHA, Nursing Homes / Clarify orders / etc []  - 0 Routine Transfer to another Facility (non-emergent condition) []  - 0 Routine Hospital Admission (non-emergent condition) X- 1 15 New Admissions / Manufacturing engineer / Ordering NPWT Apligraf, etc. , []  - 0 Emergency Hospital Admission (emergent condition) X- 1 10 Simple Discharge Coordination []  - 0 Complex (extensive) Discharge Coordination PROCESS -  Special Needs []  - 0 Pediatric / Minor Patient Management []  - 0 Isolation Patient Management []  - 0 Hearing / Language / Visual special needs []  - 0 Assessment of Community assistance (transportation, D/C planning, etc.) []  - 0 Additional assistance / Altered mentation []  - 0 Support Surface(s) Assessment (bed, cushion, seat, etc.) INTERVENTIONS - Wound Cleansing / Measurement []  - 0 Simple Wound Cleansing -  one wound []  - 0 Complex Wound Cleansing - multiple wounds []  - 0 Wound Imaging (photographs - any number of wounds) []  - 0 Wound Tracing (instead of photographs) []  - 0 Simple Wound Measurement - one wound []  - 0 Complex Wound Measurement - multiple wounds David Cantu ([id number]) [id number] Page 3 of 6 INTERVENTIONS - Wound Dressings []  - 0 Small Wound Dressing one or multiple wounds []  - 0 Medium Wound Dressing one or multiple wounds []  - 0 Large Wound Dressing one or multiple wounds []  - 0 Application of Medications - topical []  - 0 Application of Medications - injection INTERVENTIONS - Miscellaneous []  - 0 External ear exam []  - 0 Specimen Collection (cultures, biopsies, blood, body fluids, etc.) []  - 0 Specimen(s) / Culture(s) sent or taken to Lab for analysis []  - 0 Patient Transfer (multiple staff / Michiel Sites Lift / Similar devices) []  - 0 Simple Staple / Suture removal (25 or less) []  - 0 Complex Staple / Suture removal (26 or more) []  - 0 Hypo / Hyperglycemic Management (close monitor of Blood Glucose) X- 1 15 Ankle / Brachial Index (ABI) - do not check if billed separately X- 1 5 Vital Signs Has the patient been seen at the hospital within the last three years: Yes Total Score: 90 Level Of Care: New/Established - Level 3 Electronic Signature(s) Signed: 07/05/2023 4:12:43 PM By: Fonnie Mu RN Entered By: Fonnie Mu on 07/05/2023  14:18:41 -------------------------------------------------------------------------------- Encounter Discharge Information Details Patient Name: Date of Service: David Cantu, David Cantu 07/05/2023 1:30 PM Medical Record Number: [id number] Patient Account Number: [id number] Date of Birth/Sex: Treating RN: 03/05/1956 (67 y.o. David Cantu Primary Care Quynh Basso: Marlan Palau Other Clinician: Referring Maryjean Corpening: Treating Deneshia Zucker/Extender: Kaylyn Lim in Treatment: 0 Encounter Discharge Information Items Discharge Condition: Stable Ambulatory Status: Ambulatory Discharge Destination: Home Transportation: Private Auto Accompanied By: wife Schedule Follow-up Appointment: Yes Clinical Summary of Care: Patient Declined Electronic Signature(s) Signed: 07/05/2023 4:12:43 PM By: Fonnie Mu RN Entered By: Fonnie Mu on 07/05/2023 14:19:47 David Cantu ([id number]) [id number] Page 4 of 6 -------------------------------------------------------------------------------- Lower Extremity Assessment Details Patient Name: Date of Service: David Cantu, David Cantu 07/05/2023 1:30 PM Medical Record Number: [id number] Patient Account Number: [id number] Date of Birth/Sex: Treating RN: 12/07/1955 (67 y.o. Charlean Merl, Lauren Primary Care Tobie Hellen: Marlan Palau Other Clinician: Referring Elizabethann Lackey: Treating Derric Dealmeida/Extender: Kaylyn Lim in Treatment: 0 Edema Assessment Assessed: David Cantu: No] David Cantu: Yes] Edema: [Left: Ye] [Right: s] Vascular Assessment Pulses: Dorsalis Pedis Palpable: [Right:Yes] Posterior Tibial Palpable: [Right:Yes] Extremity colors, hair growth, and conditions: Extremity Color: [Right:Normal] Hair Growth on Extremity: [Right:Yes] Temperature of Extremity: [Right:Warm] Capillary Refill: [Right:< 3 seconds] Dependent Rubor: [Right:No] Blanched when Elevated:  [Right:No] Lipodermatosclerosis: [Right:No] Blood Pressure: Brachial: [Right:162] Ankle: [Right:Dorsalis Pedis: 164 1.01] Toe Nail Assessment Left: Right: Thick: No Discolored: No Deformed: No Improper Length and Hygiene: No Electronic Signature(s) Signed: 07/05/2023 4:12:43 PM By: Fonnie Mu RN Entered By: Fonnie Mu on 07/05/2023 13:48:27 -------------------------------------------------------------------------------- Multi-Disciplinary Care Plan Details Patient Name: Date of Service: David Cantu, David Cantu 07/05/2023 1:30 PM Medical Record Number: [id number] Patient Account Number: [id number] Date of Birth/Sex: Treating RN: 1956/06/18 (67 y.o. Charlean Merl, Lauren Primary Care Royelle Hinchman: Marlan Palau Other Clinician: Referring Arnez Stoneking: Treating Ashleigh Arya/Extender: Kaylyn Lim in Treatment: 0 Active Inactive Electronic Signature(s) Signed: 07/05/2023 4:12:43 PM By: Fonnie Mu RN Entered By: Fonnie Mu on 07/05/2023 14:18:04 David Cantu ([id number]) [id number] Page 5 of 6 -------------------------------------------------------------------------------- Pain Assessment Details Patient  Name: Date of Service: David Cantu, David Cantu 07/05/2023 1:30 PM Medical Record Number: [id number] Patient Account Number: [id number] Date of Birth/Sex: Treating RN: 06/28/1956 (67 y.o. Charlean Merl, Lauren Primary Care Margeaux Swantek: Marlan Palau Other Clinician: Referring Malina Geers: Treating Ulysses Alper/Extender: Kaylyn Lim in Treatment: 0 Active Problems Location of Pain Severity and Description of Pain Patient Has Paino No Site Locations Pain Management and Medication Current Pain Management: Electronic Signature(s) Signed: 07/05/2023 4:12:43 PM By: Fonnie Mu RN Entered By: Fonnie Mu on 07/05/2023  13:39:02 -------------------------------------------------------------------------------- Patient/Caregiver Education Details Patient Name: Date of Service: David Cantu, David Cantu 12/4/2024andnbsp1:30 PM Medical Record Number: [id number] Patient Account Number: [id number] Date of Birth/Gender: Treating RN: July 12, 1956 (67 y.o. David Cantu Primary Care Physician: Marlan Palau Other Clinician: Referring Physician: Treating Physician/Extender: Kaylyn Lim in Treatment: 0 Education Assessment Education Provided To: Patient Education Topics Provided Wound/Skin Impairment: Methods: Explain/Verbal Responses: State content correctly David Cantu ([id number]) [id number] Page 6 of 6 Electronic Signature(s) Signed: 07/05/2023 4:12:43 PM By: Fonnie Mu RN Entered By: Fonnie Mu on 07/05/2023 14:14:03 -------------------------------------------------------------------------------- Vitals Details Patient Name: Date of Service: David Cantu, David Cantu 07/05/2023 1:30 PM Medical Record Number: [id number] Patient Account Number: [id number] Date of Birth/Sex: Treating RN: 1956-02-12 (67 y.o. Charlean Merl, Lauren Primary Care Kyro Joswick: Marlan Palau Other Clinician: Referring Arch Methot: Treating Derelle Cockrell/Extender: Kaylyn Lim in Treatment: 0 Vital Signs Time Taken: 13:36 Temperature (F): 97.9 Height (in): 65 Pulse (bpm): 71 Source: Stated Respiratory Rate (breaths/min): 17 Weight (lbs): 170 Blood Pressure (mmHg): 162/78 Source: Stated Reference Range: 80 - 120 mg / dl Body Mass Index (BMI): 28.3 Electronic Signature(s) Signed: 07/05/2023 4:12:43 PM By: Fonnie Mu RN Entered By: Fonnie Mu on 07/05/2023 13:36:30

## 2023-07-06 DIAGNOSIS — M25571 Pain in right ankle and joints of right foot: Secondary | ICD-10-CM | POA: Diagnosis not present

## 2023-07-06 DIAGNOSIS — M6281 Muscle weakness (generalized): Secondary | ICD-10-CM | POA: Diagnosis not present

## 2023-07-06 NOTE — Progress Notes (Signed)
JASSON, SCHWEIN ([id number]) [id number] Page 1 of 4 Visit Report for 07/05/2023 Abuse Risk Screen Details Patient Name: Date of Service: [place] NDO LFO, [place] David Cantu 07/05/2023 1:30 PM Medical Record Number: [id number] Patient Account Number: [id number] Date of Birth/Sex: Treating RN: 1955/12/11 (67 y.o. Charlean Merl, Lauren Primary Care Mansoor Hillyard: Marlan Palau Other Clinician: Referring Brennen Camper: Treating Akeylah Hendel/Extender: Kaylyn Lim in Treatment: 0 Abuse Risk Screen Items Answer ABUSE RISK SCREEN: Has anyone close to you tried to hurt or harm you recentlyo No Do you feel uncomfortable with anyone in your familyo No Has anyone forced you do things that you didnt want to doo No Electronic Signature(s) Signed: 07/05/2023 4:12:43 PM By: Fonnie Mu RN Entered By: Fonnie Mu on 07/05/2023 13:36:53 -------------------------------------------------------------------------------- Activities of Daily Living Details Patient Name: Date of Service: GA NDO LFO, LA David Cantu 07/05/2023 1:30 PM Medical Record Number: [id number] Patient Account Number: [id number] Date of Birth/Sex: Treating RN: 1956/03/09 (66 y.o. Charlean Merl, Lauren Primary Care Valarie Farace: Marlan Palau Other Clinician: Referring Maitri Schnoebelen: Treating Jakwan Sally/Extender: Kaylyn Lim in Treatment: 0 Activities of Daily Living Items Answer Activities of Daily Living (Please select one for each item) Drive Automobile Completely Able T Medications ake Completely Able Use T elephone Completely Able Care for Appearance Completely Able Use T oilet Completely Able Bath / Shower Completely Able Dress Self Completely Able Feed Self Completely Able Walk Completely Able Get In / Out Bed Completely Able Housework Completely Able Prepare Meals Completely Able Handle Money Completely Able Shop for Self Completely Able Electronic Signature(s) Signed:  07/05/2023 4:12:43 PM By: Fonnie Mu RN Entered By: Fonnie Mu on 07/05/2023 13:37:15 [name] ([id number]) [phone] [id number] Page 2 of 4 -------------------------------------------------------------------------------- Education Screening Details Patient Name: Date of Service: GA NDO LFO, LA David Cantu 07/05/2023 1:30 PM Medical Record Number: [id number] Patient Account Number: [id number] Date of Birth/Sex: Treating RN: [birth year] (67 y.o. Charlean Merl, Lauren Primary Care Sinead Hockman: Marlan Palau Other Clinician: Referring Tyrail Grandfield: Treating Trevontae Lindahl/Extender: Kaylyn Lim in Treatment: 0 Primary Learner Assessed: Patient Learning Preferences/Education Level/Primary Language Learning Preference: Explanation, Demonstration, Communication Board, Printed Material Highest Education Level: High School Preferred Language: English Cognitive Barrier Language Barrier: No Translator Needed: No Memory Deficit: No Emotional Barrier: No Cultural/Religious Beliefs Affecting Medical Care: No Physical Barrier Impaired Vision: No Impaired Hearing: No Decreased Hand dexterity: No Knowledge/Comprehension Knowledge Level: High Comprehension Level: High Ability to understand written instructions: High Ability to understand verbal instructions: High Motivation Anxiety Level: Calm Cooperation: Cooperative Education Importance: Denies Need Interest in Health Problems: Asks Questions Perception: Coherent Willingness to Engage in Self-Management High Activities: Readiness to Engage in Self-Management High Activities: Electronic Signature(s) Signed: 07/05/2023 4:12:43 PM By: Fonnie Mu RN Entered By: Fonnie Mu on 07/05/2023 13:37:48 -------------------------------------------------------------------------------- Fall Risk Assessment Details Patient Name: Date of Service: GA NDO LFO, LA David Cantu 07/05/2023 1:30  PM Medical Record Number: [id number] Patient Account Number: [id number] Date of Birth/Sex: Treating RN: 02-24-1956 (67 y.o. Lucious Groves Primary Care Alecea Trego: Marlan Palau Other Clinician: Referring Annalyce Lanpher: Treating Montavius Subramaniam/Extender: Kaylyn Lim in Treatment: 0 Fall Risk Assessment Items Have you had 2 or more falls in the last [address] monthso 0 No [place], Lyman Bishop ([id number]) [id number] Page 3 of 4 Have you had any fall that resulted in injury in the last 12 monthso 0 No FALLS RISK SCREEN History of falling - immediate or within 3 months 0 No Secondary diagnosis (Do you have 2 or more medical diagnoseso) 0 No Ambulatory aid None/bed  rest/wheelchair/nurse 0 No Crutches/cane/walker 0 No Furniture 0 No Intravenous therapy Access/Saline/Heparin Lock 0 No Gait/Transferring Normal/ bed rest/ wheelchair 0 No Weak (short steps with or without shuffle, stooped but able to lift head while walking, may seek 0 No support from furniture) Impaired (short steps with shuffle, may have difficulty arising from chair, head down, impaired 0 No balance) Mental Status Oriented to own ability 0 No Electronic Signature(s) Signed: 07/05/2023 4:12:43 PM By: Fonnie Mu RN Entered By: Fonnie Mu on 07/05/2023 13:37:56 -------------------------------------------------------------------------------- Foot Assessment Details Patient Name: Date of Service: GA NDO LFO, LA David Cantu 07/05/2023 1:30 PM Medical Record Number: [id number] Patient Account Number: [id number] Date of Birth/Sex: Treating RN: 27-Feb-1956 (67 y.o. Charlean Merl, Lauren Primary Care Krisann Mckenna: Marlan Palau Other Clinician: Referring Clydean Posas: Treating Mosiah Bastin/Extender: Kaylyn Lim in Treatment: 0 Foot Assessment Items Site Locations + = Sensation present, - = Sensation absent, C = Callus, U = Ulcer R = Redness, W = Warmth, M = Maceration, PU  = Pre-ulcerative lesion F = Fissure, S = Swelling, D = Dryness Assessment Right: Left: Other Deformity: No No Prior Foot Ulcer: No No Prior Amputation: No No Charcot Joint: No No Ambulatory Status: Ambulatory Without Help GaitTHIEN, SIA ([id number]) [id number] Page 4 of 4 Electronic Signature(s) Signed: 07/05/2023 4:12:43 PM By: Fonnie Mu RN Entered By: Fonnie Mu on 07/05/2023 13:38:53 -------------------------------------------------------------------------------- Nutrition Risk Screening Details Patient Name: Date of Service: GA NDO LFO, LA David Cantu 07/05/2023 1:30 PM Medical Record Number: [id number] Patient Account Number: [id number] Date of Birth/Sex: Treating RN: 1955-10-24 (67 y.o. Charlean Merl, Lauren Primary Care Karelly Dewalt: Marlan Palau Other Clinician: Referring Anntonette Madewell: Treating Zidan Helget/Extender: Kaylyn Lim in Treatment: 0 Height (in): 65 Weight (lbs): 170 Body Mass Index (BMI): 28.3 Nutrition Risk Screening Items Score Screening NUTRITION RISK SCREEN: I have an illness or condition that made me change the kind and/or amount of food I eat 0 No I eat fewer than two meals per day 0 No I eat few fruits and vegetables, or milk products 0 No I have three or more drinks of beer, liquor or wine almost every day 0 No I have tooth or mouth problems that make it hard for me to eat 0 No I don't always have enough money to buy the food I need 0 No I eat alone most of the time 0 No I take three or more different prescribed or over-the-counter drugs a day 0 No Without wanting to, I have lost or gained 10 pounds in the last six months 0 No I am not always physically able to shop, cook and/or feed myself 0 No Nutrition Protocols Good Risk Protocol 0 No interventions needed Moderate Risk Protocol High Risk Proctocol Risk Level: Good Risk Score: 0 Electronic Signature(s) Signed: 07/05/2023  4:12:43 PM By: Fonnie Mu RN Entered By: Fonnie Mu on 07/05/2023 13:38:02

## 2023-07-06 NOTE — Progress Notes (Signed)
David, Cantu ([id number]) [id number] Page 1 of 7 Visit Report for 07/05/2023 Chief Complaint Document Details Patient Name: Date of Service: GA NDO LFO, LA David Cantu 07/05/2023 1:30 PM Medical Record Number: [id number] Patient Account Number: [id number] Date of Birth/Sex: Treating RN: 23-Jan-1956 (67 y.o. M) Primary Care Provider: Marlan Palau Other Clinician: Referring Provider: Treating Provider/Extender: Kaylyn Lim in Treatment: 0 Information Obtained from: Patient Chief Complaint 07/05/2023; patient is here for review of a wound on her right posterior Achilles area which is a postoperative wound Electronic Signature(s) Signed: 07/06/2023 4:45:28 AM By: Baltazar Najjar MD Entered By: Baltazar Najjar on 07/05/2023 15:55:05 -------------------------------------------------------------------------------- HPI Details Patient Name: Date of Service: GA NDO LFO, LA David Cantu 07/05/2023 1:30 PM Medical Record Number: [id number] Patient Account Number: [id number] Date of Birth/Sex: Treating RN: [birth year] (67 y.o. M) Primary Care Provider: Marlan Palau Other Clinician: Referring Provider: Treating Provider/Extender: Kaylyn Lim in Treatment: 0 History of Present Illness HPI Description: ADMISSION 12//24 This is a 67 year old man who has had problems longstanding with heel pain attributed to right Achilles tendinitis. He had surgery at [facility] over the summer which included a calcaneus osteotomy and right insertional Achilles debridement. There was some postoperative wound dehiscence I believe. He was seen in [place] on 06/08/2023. He was felt to be making progress. He has been using a foam cover to the wound area. He says he has been using Santyl on the wound area but I did not see this specifically stated. He states he is still having very significant discomfort when he is walking although I think overall that the  pain is described as being different from before the surgery. Past medical history includes history of coronary artery disease, hypertension. He is apparently a prediabetic but our intake nurse noted hemoglobin A1c of 7 which if verified would qualify him as diabetic. His ABI in our clinic was 1.1 on the right Electronic Signature(s) Signed: 07/06/2023 4:45:28 AM By: Baltazar Najjar MD Entered By: Baltazar Najjar on 07/05/2023 15:59:40 David Cantu ([id number]) [phone].pdf Page 2 of 7 -------------------------------------------------------------------------------- Physical Exam Details Patient Name: Date of Service: GA NDO LFO, LA David Cantu 07/05/2023 1:30 PM Medical Record Number: [id number] Patient Account Number: [id number] Date of Birth/Sex: Treating RN: 12-15-1955 (67 y.o. M) Primary Care Provider: Marlan Palau Other Clinician: Referring Provider: Treating Provider/Extender: Kaylyn Lim in Treatment: 0 Constitutional Patient is hypertensive.. Pulse regular and within target range for patient.Marland Kitchen Respirations regular, non-labored and within target range.. Temperature is normal and within the target range for the patient.Marland Kitchen Appears in no distress. Cardiovascular Pedal pulses were palpable. Musculoskeletal He has good dorsi and plantarflexion in the ankle. This does not appear to be painful. No localized swelling in the right ankle. Notes Wound exam; he did have a small area remaining with some eschar. I took a #3 curette to this for a light surface debridement. There is no open area identified no erythema no purulence. Electronic Signature(s) Signed: 07/06/2023 4:45:28 AM By: Baltazar Najjar MD Entered By: Baltazar Najjar on 07/05/2023 15:59:03 -------------------------------------------------------------------------------- Physician Orders Details Patient Name: Date of Service: GA NDO LFO, LA David Cantu 07/05/2023 1:30 PM Medical Record  Number: [id number] Patient Account Number: [id number] Date of Birth/Sex: Treating RN: 09/27/1955 (67 y.o. David Cantu Primary Care Provider: Marlan Palau Other Clinician: Referring Provider: Treating Provider/Extender: Kaylyn Lim in Treatment: 0 Verbal / Phone Orders: No Diagnosis Coding Follow-up Appointments Other: - Call us if you need anything in the future! Discharge From [facility]  Services Discharge from [facility] Electronic Signature(s) Signed: 07/05/2023 4:12:43 PM By: Fonnie Mu RN Signed: 07/06/2023 4:45:28 AM By: Baltazar Najjar MD Entered By: Fonnie Mu on 07/05/2023 14:17:55 -------------------------------------------------------------------------------- Problem List Details Patient Name: Date of Service: GA NDO LFO, LA David Cantu 07/05/2023 1:30 PM Medical Record Number: [id number] Patient Account Number: [id number] Date of Birth/Sex: Treating RN: [birth year] (67 y.o. 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 M) Primary Care Provider: Marlan Palau Other Clinician: Referring Provider: Treating Provider/Extender: Kaylyn Lim in Treatment: 0 Subjective Chief Complaint Information obtained from Patient 07/05/2023; patient is here for review of a wound on her right posterior Achilles area which is a postoperative wound History of Present Illness (HPI) ADMISSION 12//24 This is a 67 year old man who has had problems longstanding with heel pain attributed to right Achilles tendinitis. He had surgery at [facility] over the summer which included a calcaneus osteotomy and right insertional Achilles debridement. There was some postoperative wound dehiscence I believe. He was seen in [place] on 06/08/2023. He was felt to be making progress. He has been using a foam cover to the wound area. He says he has been using Santyl on the wound area but I did not see this specifically stated. He states he is still having very significant discomfort when he is walking although I think overall that the pain is described as being different from before the surgery. Past medical history includes history of coronary artery disease, hypertension. He is apparently a prediabetic but our intake nurse noted hemoglobin A1c of 7 which if verified would qualify him as diabetic. His ABI in our clinic was 1.1 on the right Patient History Information obtained from Patient, Chart. Allergies Zenpep Family History Unknown History. Social History Never smoker, Marital Status - Married, Alcohol Use - Never, Drug Use - No History, Caffeine Use - Never. Medical History Cardiovascular KAYD, BEVERIDGE ([id number]) [id number] Page 4 of 7 Patient has history of Coronary Artery Disease, Hypertension Hospitalization/Surgery History - cardiac cath x 3 07/2015. - shoulder arthroscopy w/ rotator cuff repair. - tracheostomy. Medical A Surgical History  Notes nd Cardiovascular hyperlipidemia; near syncompe; heart murmur; stented coronary artery Gastrointestinal pancreatitis Review of Systems (ROS) Constitutional Symptoms (General Health) Denies complaints or symptoms of Fatigue, Fever, Chills, Marked Weight Change. Eyes Denies complaints or symptoms of Dry Eyes, Vision Changes, Glasses / Contacts. Ear/Nose/Mouth/Throat Denies complaints or symptoms of Chronic sinus problems or rhinitis. Respiratory Denies complaints or symptoms of Chronic or frequent coughs, Shortness of Breath. Endocrine Denies complaints or symptoms of Heat/cold intolerance. Genitourinary Denies complaints or symptoms of Frequent urination. Integumentary (Skin) Complains or has symptoms of Wounds, pruritic rash Musculoskeletal Denies complaints or symptoms of Muscle Pain, Muscle Weakness. Neurologic Denies complaints or symptoms of Numbness/parasthesias. Psychiatric Denies complaints or symptoms of Claustrophobia. Objective Constitutional Patient is hypertensive.Marland Kitchen  Pulse regular and within target range for patient.Marland Kitchen Respirations regular, non-labored and within target range.. Temperature is normal and within the target range for the patient.Marland Kitchen Appears in no distress. Vitals Time Taken: 1:36 PM, Height: 65 in, Source: Stated, Weight: 170 lbs, Source: Stated, BMI: 28.3, Temperature: 97.9 F, Pulse: 71 bpm, Respiratory Rate: 17 breaths/min, Blood Pressure: 162/78 mmHg. Cardiovascular Pedal pulses were palpable. Musculoskeletal He has good dorsi and plantarflexion in the ankle. This does not appear to be painful. No localized swelling in the right ankle. General Notes: Wound exam; he did have a small area remaining with some eschar. I took a #3 curette to this for a light surface debridement. There is no open area identified no erythema no purulence. 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 Discharge From [facility] Services: Discharge from [facility] 1. I could not identify an open area. The postsurgical wound is closed 2. I did not see anything that really looked ominous in the periwound. His ankle and Achilles tendon function seem normal to me. EULICES, HUTCHERSON ([id number]) [id number] Page 5 of 7 3. Has a follow-up with orthopedics and I asked him to discuss the pain issue with them although he has been told that this is a generally painful type of surgery. 4. We gave him a heel cup to continue to offload the wound area from his foot where an advised measures at home to keep pressure off this area but is noted nothing was open today Electronic Signature(s) Signed: 07/06/2023 4:45:28 AM By: Baltazar Najjar MD Entered By: Baltazar Najjar on 07/05/2023 16:03:16 -------------------------------------------------------------------------------- HxROS Details Patient Name: Date of Service: GA NDO LFO, LA David Cantu 07/05/2023 1:30 PM Medical Record Number: [id number] Patient Account Number: [id number] Date of Birth/Sex: Treating RN: 1956/03/30 (67 y.o. David Cantu Primary Care Provider: Marlan Palau Other Clinician: Referring Provider: Treating Provider/Extender: Kaylyn Lim in Treatment: 0 Information Obtained From Patient Chart Constitutional Symptoms (General Health) Complaints and Symptoms: Negative for: Fatigue; Fever; Chills; Marked Weight Change Eyes Complaints and Symptoms: Negative for: Dry Eyes; Vision Changes; Glasses / Contacts Ear/Nose/Mouth/Throat Complaints and Symptoms: Negative for: Chronic sinus problems or rhinitis Respiratory Complaints and Symptoms: Negative for: Chronic or frequent coughs; Shortness of  Breath Endocrine Complaints and Symptoms: Negative for: Heat/cold intolerance Genitourinary Complaints and Symptoms: Negative for: Frequent urination Integumentary (Skin) Complaints and Symptoms: Positive for: Wounds Review of System Notes: pruritic rash Musculoskeletal Complaints and Symptoms: Negative for: Muscle Pain; Muscle Weakness Neurologic Complaints and Symptoms: Negative for: Numbness/parasthesias CARRICK, WINKELBAUER ([id number]) [phone].pdf Page 6 of 7 Psychiatric Complaints and Symptoms: Negative for: Claustrophobia Hematologic/Lymphatic Cardiovascular Medical History: Positive for: Coronary Artery Disease; Hypertension Past Medical History Notes: hyperlipidemia; near syncompe; heart murmur; stented coronary artery Gastrointestinal Medical History: Past Medical History Notes: pancreatitis Immunological Oncologic Immunizations Pneumococcal Vaccine: Received Pneumococcal Vaccination: No Implantable Devices None Hospitalization / Surgery History Type of Hospitalization/Surgery cardiac cath x 3 07/2015 shoulder arthroscopy w/ rotator cuff repair tracheostomy Family and Social History Unknown History: Yes; Never smoker; Marital Status - Married; Alcohol Use: Never; Drug Use: No History; Caffeine Use: Never; Financial Concerns: No; Food, Clothing or Shelter Needs: No; Support System Lacking: No; Transportation Concerns: No Electronic Signature(s) Signed: 07/05/2023 4:12:43 PM By: Fonnie Mu RN Signed: 07/06/2023 4:45:28 AM By: Baltazar Najjar MD Entered By: Bryon Lions,  Lauren on 07/05/2023 10:40:33 -------------------------------------------------------------------------------- SuperBill Details Patient Name: Date of Service: GA NDO Eyvonne Mechanic [facility] 07/05/2023 Medical Record Number: [id number] Patient Account Number: [id number] Date of Birth/Sex: Treating RN: 1956/06/21 (67 y.o. 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 Facility Procedures : KEAVEN JAYSON CodeTerric, Korte ([id number]) [id number] Description: [phone] 13 - WOUND CARE VISIT-LEV 3 EST PT Modifier: [id number] 1 Quantity: 227.pdf Page 7 of 7 Physician Procedures : CPT4 Code Description Modifier [id number] [phone] - WC PHYS LEVEL 2 - NEW PT ICD-10 Diagnosis Description T81.31XA Disruption of external operation (surgical) wound, not elsewhere classified, initial encounter L97.418 Non-pressure chronic ulcer of right  heel and midfoot with other specified severity Quantity: 1 Electronic Signature(s) Signed: 07/06/2023 4:45:28 AM By: Baltazar Najjar MD Entered By: Baltazar Najjar on 07/05/2023 16:03:40

## 2023-07-13 DIAGNOSIS — M25571 Pain in right ankle and joints of right foot: Secondary | ICD-10-CM | POA: Diagnosis not present

## 2023-07-13 DIAGNOSIS — M6281 Muscle weakness (generalized): Secondary | ICD-10-CM | POA: Diagnosis not present

## 2023-07-18 DIAGNOSIS — M6281 Muscle weakness (generalized): Secondary | ICD-10-CM | POA: Diagnosis not present

## 2023-07-18 DIAGNOSIS — M25571 Pain in right ankle and joints of right foot: Secondary | ICD-10-CM | POA: Diagnosis not present

## 2023-07-20 DIAGNOSIS — M6281 Muscle weakness (generalized): Secondary | ICD-10-CM | POA: Diagnosis not present

## 2023-07-20 DIAGNOSIS — M25571 Pain in right ankle and joints of right foot: Secondary | ICD-10-CM | POA: Diagnosis not present

## 2023-07-24 ENCOUNTER — Ambulatory Visit (HOSPITAL_BASED_OUTPATIENT_CLINIC_OR_DEPARTMENT_OTHER): Payer: Medicare Other | Admitting: Internal Medicine

## 2023-07-25 DIAGNOSIS — M6281 Muscle weakness (generalized): Secondary | ICD-10-CM | POA: Diagnosis not present

## 2023-07-25 DIAGNOSIS — M25571 Pain in right ankle and joints of right foot: Secondary | ICD-10-CM | POA: Diagnosis not present

## 2023-07-27 DIAGNOSIS — M6281 Muscle weakness (generalized): Secondary | ICD-10-CM | POA: Diagnosis not present

## 2023-07-27 DIAGNOSIS — M25571 Pain in right ankle and joints of right foot: Secondary | ICD-10-CM | POA: Diagnosis not present

## 2023-08-07 ENCOUNTER — Ambulatory Visit (INDEPENDENT_AMBULATORY_CARE_PROVIDER_SITE_OTHER): Payer: Medicare Other | Admitting: Internal Medicine

## 2023-08-07 VITALS — BP 138/88 | HR 67 | Temp 98.6°F | Ht 64.5 in | Wt 169.0 lb

## 2023-08-07 DIAGNOSIS — J22 Unspecified acute lower respiratory infection: Secondary | ICD-10-CM | POA: Diagnosis not present

## 2023-08-07 MED ORDER — AZITHROMYCIN 250 MG PO TABS: ORAL_TABLET | ORAL | 0 refills | Status: AC

## 2023-08-07 MED ORDER — METHYLPREDNISOLONE ACETATE 80 MG/ML IJ SUSP
80.0000 mg | Freq: Once | INTRAMUSCULAR | Status: AC
  Administered 2023-08-07: 80 mg via INTRAMUSCULAR

## 2023-08-07 MED ORDER — HYDROCODONE BIT-HOMATROP MBR 5-1.5 MG/5ML PO SOLN: 5.0000 mL | Freq: Three times a day (TID) | ORAL | 0 refills | Status: DC | PRN

## 2023-08-07 NOTE — Progress Notes (Signed)
 Patient Care Team: Perri Ronal PARAS, MD as PCP - General (Internal Medicine) Jeffrie Oneil BROCKS, MD as PCP - Cardiology (Cardiology)  Visit Date: 08/07/23  Subjective:   Chief Complaint  Patient presents with   Cough  Patient PI:David Cantu,Male DOB:Dec 18, 1955,67 y.o.MRN:[id number]   68 y.o. Male presents today for acute sick visit with cough. Patient has a past medical history of PNA. Reports that his cough initially onset before 12/25. UTD Covid, Flu, PNA, and RSV. Has been using OTC cough drops . Endorses non-productive cough. Notes he was around a lot of children around the holidays. Took 2 at-home Covid tests that were NEG.   Past Medical History:  Diagnosis Date   Acute pancreatitis 02/03/2014; 05/12/2016   Arthritis    shoulders (05/12/2016)   CAD (coronary artery disease)    a. L&R cath 07/09/15 normal LV function, no MR or AS, normal R heart pressure, No pulmonary HTN, 70% RCA s/p DES   Headache(784.0)    couple times/month (05/12/2016)   Heart murmur    Hypertension    Migraine    might have 5-6/year (05/12/2016)   Pneumonia 1960; ~ 1992   Pre-diabetes    Ringing in the ears    continuous ringing in my ears (05/12/2016)   Shingles     Family History  Problem Relation Age of Onset   Lung cancer Mother    Lung cancer Father    Cardiomyopathy Brother    Colon cancer Neg Hx    Social History   Social History Narrative   Right handed   Drinks caffeine   Two story home   Review of Systems  Constitutional:  Negative for fever and malaise/fatigue.  HENT:  Negative for congestion.   Eyes:  Negative for blurred vision.  Respiratory:  Positive for cough (non-productive). Negative for shortness of breath.   Cardiovascular:  Negative for chest pain, palpitations and leg swelling.  Gastrointestinal:  Negative for vomiting.  Musculoskeletal:  Negative for back pain.  Skin:  Negative for rash.  Neurological:  Negative for loss of consciousness and headaches.      Objective:  Vitals: BP 138/88   Pulse 67   Temp 98.6 F (37 C)   Ht 5' 4.5 (1.638 m)   Wt 169 lb (76.7 kg)   SpO2 97%   BMI 28.56 kg/m  Physical Exam Vitals and nursing note reviewed.  Constitutional:      General: He is not in acute distress.    Appearance: Normal appearance. He is not ill-appearing.  HENT:     Head: Normocephalic and atraumatic.     Right Ear: Tympanic membrane is injected (central).     Left Ear: Tympanic membrane, ear canal and external ear normal.     Ears:     Comments: Right: Dull TM    Mouth/Throat:     Pharynx: Posterior oropharyngeal erythema present.  Pulmonary:     Effort: Pulmonary effort is normal.     Breath sounds: No rales.  Skin:    General: Skin is warm and dry.  Neurological:     Mental Status: He is alert and oriented to person, place, and time. Mental status is at baseline.  Psychiatric:        Mood and Affect: Mood normal.        Behavior: Behavior normal.        Thought Content: Thought content normal.        Judgment: Judgment normal.     Results:  Studies  Obtained And Personally Reviewed By Me: Labs:     Component Value Date/Time   NA 138 02/13/2023 1134   K 5.2 02/13/2023 1134   CL 101 02/13/2023 1134   CO2 29 02/13/2023 1134   GLUCOSE 133 (H) 02/13/2023 1134   BUN 16 03/13/2023 1209   CREATININE 1.15 03/13/2023 1209   CALCIUM  9.7 02/13/2023 1134   PROT 7.2 02/13/2023 1134   ALBUMIN  4.3 09/19/2016 2042   AST 28 02/13/2023 1134   ALT 36 02/13/2023 1134   ALKPHOS 52 09/19/2016 2042   BILITOT 0.7 02/13/2023 1134   GFRNONAA 66 12/07/2020 1104   GFRAA 76 12/07/2020 1104    Lab Results  Component Value Date   WBC 6.9 02/13/2023   HGB 13.7 02/13/2023   HCT 41.6 02/13/2023   MCV 84.9 02/13/2023   PLT 234 02/13/2023   Lab Results  Component Value Date   CHOL 100 02/13/2023   HDL 43 02/13/2023   LDLCALC 34 02/13/2023   TRIG 149 02/13/2023   CHOLHDL 2.3 02/13/2023   Lab Results  Component Value Date    HGBA1C 6.6 (H) 03/20/2023     Lab Results  Component Value Date   PSA 0.66 02/13/2023   PSA 0.66 02/08/2022   PSA 0.70 12/07/2020    Assessment & Plan:  Acute Lower Respiratory Infection: UTD Covid, Flu, PNA, and RSV vaccine. 2 at-home Covid tests that were NEG. Received 80 mg Depo-Medrol  injection; sending in 250 mg Azithromycin  - take 2 tablets on Day 1 and 1 tablet on Days 2-5, and Hycodan syrup for cough - take 1 teaspoon every 8 hours as needed.   I,Emily Lagle,acting as a neurosurgeon for Ronal JINNY Hailstone, MD.,have documented all relevant documentation on the behalf of Ronal JINNY Hailstone, MD,as directed by  Ronal JINNY Hailstone, MD while in the presence of Ronal JINNY Hailstone, MD.   I, Ronal JINNY Hailstone, MD, have reviewed all documentation for this visit. The documentation on 08/14/23 for the exam, diagnosis, procedures, and orders are all accurate and complete.

## 2023-08-14 NOTE — Patient Instructions (Addendum)
 You have been diagnosed with an acute lower respiratory infection.  You are given 80 mg IM Depo-Medrol  injection today for congestion.  We are sending in Zithromax  Z-PAK to take 2 tabs day 1 followed by 1 tab days 2 through 5.  Also sending in Hycodan cough syrup 1 teaspoon every 8 hours as needed for cough.  Please let us  know if not better in 7 to 10 days or sooner if worse.  Rest and stay well-hydrated.  Walk around your house some to keep lungs open.  No heavy exercise for few days.

## 2023-08-21 ENCOUNTER — Other Ambulatory Visit: Payer: Medicare Other

## 2023-08-21 DIAGNOSIS — E119 Type 2 diabetes mellitus without complications: Secondary | ICD-10-CM | POA: Diagnosis not present

## 2023-08-22 DIAGNOSIS — K080 Exfoliation of teeth due to systemic causes: Secondary | ICD-10-CM | POA: Diagnosis not present

## 2023-08-22 LAB — HEMOGLOBIN A1C
Hgb A1c MFr Bld: 7.4 %{Hb} — ABNORMAL HIGH (ref <5.7)
Mean Plasma Glucose: 166 mg/dL
eAG (mmol/L): 9.2 mmol/L
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
## 2023-08-25 ENCOUNTER — Ambulatory Visit (INDEPENDENT_AMBULATORY_CARE_PROVIDER_SITE_OTHER): Payer: Medicare Other | Admitting: Internal Medicine

## 2023-08-25 ENCOUNTER — Encounter: Payer: Self-pay | Admitting: Internal Medicine

## 2023-08-25 VITALS — BP 120/80 | HR 61 | Ht 64.5 in | Wt 173.0 lb

## 2023-08-25 DIAGNOSIS — I10 Essential (primary) hypertension: Secondary | ICD-10-CM

## 2023-08-25 DIAGNOSIS — Z8679 Personal history of other diseases of the circulatory system: Secondary | ICD-10-CM | POA: Diagnosis not present

## 2023-08-25 DIAGNOSIS — E119 Type 2 diabetes mellitus without complications: Secondary | ICD-10-CM | POA: Diagnosis not present

## 2023-08-25 MED ORDER — BLOOD GLUCOSE MONITOR& DTX APP W/ WELL DEVICE KIT: PACK | 0 refills | Status: AC

## 2023-08-25 NOTE — Patient Instructions (Addendum)
Referral for Nutrition counseling and for also Diabetic teaching. He will obtain home glucose monitor and monitor accuchecks twice daily regularly and sometimes before lunch and bedtime. He will send 2 weeks of readings via My Chart. Further instructions will follow review of readings. Blood pressure is stable on current regimen. He is on statin medication and Zetia per Cardiologist.

## 2023-09-01 ENCOUNTER — Encounter: Payer: Self-pay | Admitting: Internal Medicine

## 2023-09-07 ENCOUNTER — Ambulatory Visit: Payer: Medicare Other | Admitting: Internal Medicine

## 2023-09-07 ENCOUNTER — Encounter: Payer: Self-pay | Admitting: Internal Medicine

## 2023-09-07 VITALS — BP 120/80 | HR 86 | Temp 98.3°F | Ht 64.5 in | Wt 173.0 lb

## 2023-09-07 DIAGNOSIS — J101 Influenza due to other identified influenza virus with other respiratory manifestations: Secondary | ICD-10-CM

## 2023-09-07 MED ORDER — OSELTAMIVIR PHOSPHATE 75 MG PO CAPS: 75.0000 mg | ORAL_CAPSULE | Freq: Two times a day (BID) | ORAL | 0 refills | Status: DC

## 2023-09-07 MED ORDER — HYDROCODONE BIT-HOMATROP MBR 5-1.5 MG/5ML PO SOLN: 5.0000 mL | Freq: Three times a day (TID) | ORAL | 0 refills | Status: DC | PRN

## 2023-09-07 NOTE — Progress Notes (Signed)
 Patient Care Team: Perri Ronal PARAS, MD as PCP - General (Internal Medicine) Jeffrie Oneil BROCKS, MD as PCP - Cardiology (Cardiology)  Visit Date: 09/07/23  Subjective:   Chief Complaint  Patient presents with   Cough   Patient PI:David Cantu,Male DOB:1955-12-23,67 y.o. FMW:[id number]   68 y.o. Male presents today for acute sick visit with Productive Cough w/Phlegm (white) and Fatigue. Patient has a past medical history of Acute LRI on 08/07/23 treated with a ZPAK and provided Hycodan cough syrup. Reports his symptoms began Sunday with the cough. At home Covid-19 test was NEG according to him, did not do a flu. Denies fever, shaking, chills, diarrhea, or vomiting. UTD on Flu & Covid-19 vaccine. Endorses low appetite and nausea.   Past Medical History:  Diagnosis Date   Acute pancreatitis 02/03/2014; 05/12/2016   Arthritis    shoulders (05/12/2016)   CAD (coronary artery disease)    a. L&R cath 07/09/15 normal LV function, no MR or AS, normal R heart pressure, No pulmonary HTN, 70% RCA s/p DES   Headache(784.0)    couple times/month (05/12/2016)   Heart murmur    Hypertension    Migraine    might have 5-6/year (05/12/2016)   Pneumonia 1960; ~ 1992   Pre-diabetes    Ringing in the ears    continuous ringing in my ears (05/12/2016)   Shingles     Allergies  Allergen Reactions   Zenpep  [Pancrelipase  (Lip-Prot-Amyl)] Nausea And Vomiting    VIOLENT VOMITING    Family History  Problem Relation Age of Onset   Lung cancer Mother    Lung cancer Father    Cardiomyopathy Brother    Colon cancer Neg Hx    Social History   Social History Narrative   Right handed. Drinks caffeine; does not smoke; social alcohol consumption. Two story home. 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 Walks up to 3 miles daily.   Review of Systems  Constitutional:  Positive for malaise/fatigue. Negative for chills and fever.        (+) Low Appetite  HENT:  Negative for congestion.   Eyes:  Negative for blurred vision.  Respiratory:  Positive for cough. Negative for shortness of breath.   Cardiovascular:  Negative for chest pain, palpitations and leg swelling.  Gastrointestinal:  Positive for nausea. Negative for diarrhea and vomiting.  Musculoskeletal:  Negative for back pain.  Skin:  Negative for rash.  Neurological:  Negative for loss of consciousness and headaches.     Objective:  Vitals: BP 120/80   Pulse 86   Temp 98.3 F (36.8 C)   Ht 5' 4.5 (1.638 m)   Wt 173 lb (78.5 kg)   SpO2 95%   BMI 29.24 kg/m   Physical Exam Vitals and nursing note reviewed.  Constitutional:      General: He is not in acute distress.    Appearance: Normal appearance. He is not ill-appearing.  HENT:     Head: Normocephalic and atraumatic.     Right Ear: Hearing, tympanic membrane, ear canal and external ear normal.     Left Ear: Hearing, tympanic membrane, ear canal and external ear normal.     Mouth/Throat:     Pharynx: Oropharynx is clear. No oropharyngeal exudate.  Pulmonary:     Effort: Pulmonary effort is normal.     Breath sounds: Normal breath sounds.  Skin:    General: Skin is warm and dry.  Neurological:     Mental Status: He is alert and oriented to person, place, and time. Mental status is at baseline.  Psychiatric:        Mood and Affect: Mood normal.        Behavior: Behavior normal.        Thought Content: Thought content normal.        Judgment: Judgment normal.     Results:  Studies Obtained And Personally Reviewed By Me: Labs:     Component Value Date/Time   NA 138 02/13/2023 1134   K 5.2 02/13/2023 1134   CL 101 02/13/2023 1134   CO2 29 02/13/2023 1134   GLUCOSE 133 (H) 02/13/2023 1134   BUN 16 03/13/2023 1209   CREATININE 1.15 03/13/2023 1209   CALCIUM  9.7 02/13/2023 1134   PROT 7.2 02/13/2023 1134   ALBUMIN  4.3 09/19/2016 2042   AST 28 02/13/2023 1134   ALT 36 02/13/2023 1134    ALKPHOS 52 09/19/2016 2042   BILITOT 0.7 02/13/2023 1134   GFRNONAA 66 12/07/2020 1104   GFRAA 76 12/07/2020 1104    Lab Results  Component Value Date   WBC 6.9 02/13/2023   HGB 13.7 02/13/2023   HCT 41.6 02/13/2023   MCV 84.9 02/13/2023   PLT 234 02/13/2023   Lab Results  Component Value Date   CHOL 100 02/13/2023   HDL 43 02/13/2023   LDLCALC 34 02/13/2023   TRIG 149 02/13/2023   CHOLHDL 2.3 02/13/2023   Lab Results  Component Value Date   HGBA1C 7.4 (H) 08/21/2023   Lab Results  Component Value Date   PSA 0.66 02/13/2023   PSA 0.66 02/08/2022   PSA 0.70 12/07/2020   Assessment & Plan:  Influenza A: POSITIVE. At-home Covid-19 NEG. Flu B NEG. Sending in 75 mg Tamiflu  - take 1 capsule (75 mg total) by mouth 2 (two) times daily and Hycodan syrup for cough - take 1 teaspoon every 8 hours as needed. Quarantine 3 days. Stay well rested, well hydrated, and well nourished. Walk around some to prevent atelectasis. Contact us  if symptoms worsen/persist despite treatment.      I,Emily Lagle,acting as a neurosurgeon for Ronal JINNY Hailstone, MD.,have documented all relevant documentation on the behalf of Ronal JINNY Hailstone, MD,as directed by  Ronal JINNY Hailstone, MD while in the presence of Ronal JINNY Hailstone, MD.   I, Ronal JINNY Hailstone, MD, have reviewed all documentation for this visit. The documentation on 09/18/23 for the exam, diagnosis, procedures, and orders are all accurate and complete.

## 2023-09-10 ENCOUNTER — Encounter: Payer: Self-pay | Admitting: Internal Medicine

## 2023-09-13 DIAGNOSIS — H5203 Hypermetropia, bilateral: Secondary | ICD-10-CM | POA: Diagnosis not present

## 2023-09-13 LAB — HM DIABETES EYE EXAM

## 2023-09-15 DIAGNOSIS — M722 Plantar fascial fibromatosis: Secondary | ICD-10-CM | POA: Diagnosis not present

## 2023-09-15 DIAGNOSIS — M7661 Achilles tendinitis, right leg: Secondary | ICD-10-CM | POA: Diagnosis not present

## 2023-09-18 ENCOUNTER — Encounter: Payer: Self-pay | Admitting: Internal Medicine

## 2023-09-18 NOTE — Patient Instructions (Addendum)
 You have been diagnosed with Influenza A.  Please take Tamiflu  75 mg twice daily for 5 days.  Rest at home and stay well-hydrated.  May take Tylenol  or Aleve if needed for fever or myalgias.  Walk around your home some to prevent atelectasis of the lungs.  Call us  if symptoms worsen.  Suggest quarantining at home at least 48 hours.

## 2023-09-25 ENCOUNTER — Ambulatory Visit: Payer: Medicare Other | Admitting: Dietician

## 2023-10-02 ENCOUNTER — Telehealth: Payer: Self-pay | Admitting: Internal Medicine

## 2023-10-02 NOTE — Telephone Encounter (Signed)
 LVM to CB and reschedule appointment on 10/19/2023, Dr Lenord Fellers will be in a meeting that morning.

## 2023-10-04 NOTE — Telephone Encounter (Signed)
 scheduled

## 2023-10-05 ENCOUNTER — Other Ambulatory Visit: Payer: Self-pay | Admitting: Internal Medicine

## 2023-10-10 ENCOUNTER — Other Ambulatory Visit: Payer: Medicare Other

## 2023-10-13 ENCOUNTER — Ambulatory Visit: Payer: Medicare Other | Admitting: Internal Medicine

## 2023-10-13 NOTE — Progress Notes (Signed)
 Patient Care Team: [name], MD as PCP - General (Internal Medicine) Jake Bathe, MD as PCP - Cardiology (Cardiology)  Visit Date: 10/17/23  Subjective:   Chief Complaint  Patient presents with   Review Labs  Patient UE:[id number] David Cantu,Male DOB:07/29/1956,67 y.o. BJY:[id number]   68 y.o. Male presents today for follow-up for A1c. Patient has a past medical history of HTN; CAD; HLD.  History of Glucose Intolerance treated with Metformin 500 mg BID, which did cause some GI upset for 1 week until he acclimated to increased dosage, he says. 10/16/2023 HgbA1c 6.5, down from 7.4 on 08/21/23, increased from 6.6 on 03/20/23. Back in January he hadn't been as active due to his Achilles Tendinitis, subsequent procedures and healing - would go to gym 5/week, occasionally 6 but he is now going back to the gym regularly and has lost 16 pounds since then. He had also said he was working on his diet - avoiding fast foods, doesn't drink sodas, only drinks coffee twice a day and water throughout; now avoiding sugar and starches wherever possible, only adding Stevia to his coffee on occasion, avoiding desserts after dinner, and using starch alternatives (recently had mashed cauliflower). Obtained a OneTouch Glucometer and shared his readings from 1/24-3/18, which can be found under "Studies Obtained And Personally Reviewed By Me".   History of Hypertension treated with 6.25 mg Carvedilol BID, 10 mg Lisinopril daily, and 0.4 mg Nitroglycerin as needed for chest pain. Blood Pressure: normotensive at 128/78. Followed by Donato Schultz, MD with Cardiology.   Past Medical History:  Diagnosis Date   Acute pancreatitis 02/03/2014; 05/12/2016   Arthritis    "shoulders" (05/12/2016)   CAD (coronary artery disease)    a. L&R cath 07/09/15 normal LV function, no MR or AS, normal R heart pressure, No pulmonary HTN, 70% RCA s/p DES   Headache(784.0)    "couple times/month" (05/12/2016)   Heart murmur     Hypertension    Migraine    "might have 5-6/year" (05/12/2016)   Pneumonia 1960; ~ 1992   Pre-diabetes    Ringing in the ears    "continuous ringing in my ears" (05/12/2016)   Shingles     Allergies  Allergen Reactions   Zenpep [Pancrelipase (Lip-Prot-Amyl)] Nausea And Vomiting    VIOLENT VOMITING    Family History  Problem Relation Age of Onset   Lung cancer Mother    Lung cancer Father    Cardiomyopathy Brother    Colon cancer Neg Hx    Social History   Social History Narrative   Right handed. Drinks caffeine; does not smoke; social alcohol consumption. Two story home. Married. 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 around 2007. Walks up to 3 miles daily.   Review of Systems  Gastrointestinal:  Positive for nausea (x1 week; attributed to increased dosage of Metformin).  All other systems reviewed and are negative.    Objective:  Vitals: BP 128/78   Pulse 62   Temp 98 F (36.7 C)   SpO2 96%   Physical Exam Constitutional:      General: He is not in acute distress.    Appearance: Normal appearance. He is not ill-appearing.  HENT:     Head: Normocephalic and atraumatic.  Cardiovascular:     Rate and Rhythm: Normal rate and regular rhythm.     Pulses: Normal pulses.     Heart sounds: Normal heart sounds. No murmur heard.  No friction rub. No gallop.  Pulmonary:     Effort: Pulmonary effort is normal. No respiratory distress.     Breath sounds: Normal breath sounds. No wheezing or rales.  Skin:    General: Skin is warm and dry.  Neurological:     Mental Status: He is alert and oriented to person, place, and time. Mental status is at baseline.  Psychiatric:        Mood and Affect: Mood normal.        Behavior: Behavior normal.        Thought Content: Thought content normal.        Judgment: Judgment normal.     Results:  Studies Obtained And Personally Reviewed By Me: Date Breakfast Lunch Dinner  1/24    123  1/25 125  130  1/26 149 106   1/27 132  121  1/28 122 143 120  1/29 123 145 112  1/30 117  140  1/31 129 124 111  2/1 131  143  2/2 120  152  2/3 142  104  2/4 99  102  2/5 118    2/6 114  199  2/7 119  153  2/8 129  123  2/9 116  118  2/10 123  96  2/11 105  115  2/12 112  120  2/13 121  127  2/14 104  77  2/15 99  102  2/16 93  141  2/17 115  100  2/18 123  95  2/19 110  108  2/20 120  126  2/21 111  120  2/22 103  115  2/23 111  95  2/24 113  95  2/25 113  89  2/26 105  100  2/27 93  102  2/28 101  91  3/1 108  98  3/2 102  98  3/3 120  100  3/4 141  131  3/5  102 102  3/6 94  90  3/7 111  104  3/8 108  113  3/9 126  108  3/10 126  125  3/11 132  148  3/12 126  103  3/13 116    3/14 122    3/15 109  151  3/16 115  116  3/17 122  130  3/18 106    Labs:     Component Value Date/Time   NA 138 02/13/2023 1134   K 5.2 02/13/2023 1134   CL 101 02/13/2023 1134   CO2 29 02/13/2023 1134   GLUCOSE 133 (H) 02/13/2023 1134   BUN 16 03/13/2023 1209   CREATININE 1.15 03/13/2023 1209   CALCIUM 9.7 02/13/2023 1134   PROT 7.2 02/13/2023 1134   ALBUMIN 4.3 09/19/2016 2042   AST 28 02/13/2023 1134   ALT 36 02/13/2023 1134   ALKPHOS 52 09/19/2016 2042   BILITOT 0.7 02/13/2023 1134   GFRNONAA 66 12/07/2020 1104   GFRAA 76 12/07/2020 1104    Lab Results  Component Value Date   WBC 6.9 02/13/2023   HGB 13.7 02/13/2023   HCT 41.6 02/13/2023   MCV 84.9 02/13/2023   PLT 234 02/13/2023   Lab Results  Component Value Date   CHOL 100 02/13/2023   HDL 43 02/13/2023   LDLCALC 34 02/13/2023   TRIG 149 02/13/2023   CHOLHDL 2.3 02/13/2023   Lab Results  Component Value Date   HGBA1C 6.5 (H) 10/16/2023    Lab Results  Component Value Date   PSA 0.66  02/13/2023   PSA 0.66 02/08/2022   PSA 0.70 12/07/2020     Assessment & Plan:   Adult Onset Diabetes mellitus: treated with Metformin 500 mg BID - did have some nausea for a week while  increasing from 500 to 1000 but this has subsided. 10/16/2023 HgbA1c 6.5, down from 7.4 on 08/21/23, increased from 6.6 on 03/20/23. He is now going back to the gym regularly and has lost 16 pounds since then. Also working on his diet even more - avoiding sugar and starches wherever possible, only adding Stevia to his coffee on occasion, avoiding desserts after dinner, and using starch alternatives (recently had mashed cauliflower). Obtained a OneTouch Glucometer and shared his readings from 1/24-3/18, which can be found under "Studies Obtained And Personally Reviewed By Me".    Essential Hypertension: treated with 6.25 mg Carvedilol BID, 10 mg Lisinopril daily, and 0.4 mg Nitroglycerin as needed for chest pain. Blood Pressure: normotensive at 128/78. Also, Followed by Donato Schultz, MD with Cardiology.   RTC in July for CPE and Medicare wellness visit.  I,Emily Lagle,acting as a Neurosurgeon for [name], MD.,have documented all relevant documentation on the behalf of [name], MD,as directed by  [name], MD while in the presence of [name], MD.   I, [name], MD, have reviewed all documentation for this visit. The documentation on 10/28/23 for the exam, diagnosis, procedures, and orders are all accurate and complete.

## 2023-10-16 ENCOUNTER — Other Ambulatory Visit: Payer: Medicare Other

## 2023-10-16 DIAGNOSIS — E119 Type 2 diabetes mellitus without complications: Secondary | ICD-10-CM

## 2023-10-16 LAB — HEMOGLOBIN A1C
Hgb A1c MFr Bld: 6.5 %{Hb} — ABNORMAL HIGH (ref <5.7)
Mean Plasma Glucose: 140 mg/dL
eAG (mmol/L): 7.7 mmol/L

## 2023-10-16 NOTE — Progress Notes (Signed)
 Lab only

## 2023-10-17 ENCOUNTER — Ambulatory Visit (INDEPENDENT_AMBULATORY_CARE_PROVIDER_SITE_OTHER): Admitting: Internal Medicine

## 2023-10-17 ENCOUNTER — Encounter: Payer: Self-pay | Admitting: Internal Medicine

## 2023-10-17 VITALS — BP 128/78 | HR 62 | Temp 98.0°F

## 2023-10-17 DIAGNOSIS — E119 Type 2 diabetes mellitus without complications: Secondary | ICD-10-CM

## 2023-10-17 DIAGNOSIS — I10 Essential (primary) hypertension: Secondary | ICD-10-CM

## 2023-10-17 NOTE — Patient Instructions (Signed)
 Continue current meds. Hgb AIC is excellent. Weight is excellent. RTC July for CPE and Medicare wellness.

## 2023-10-19 ENCOUNTER — Ambulatory Visit: Payer: Medicare Other | Admitting: Internal Medicine

## 2023-10-28 ENCOUNTER — Encounter: Payer: Self-pay | Admitting: Internal Medicine

## 2023-11-02 ENCOUNTER — Encounter: Payer: Self-pay | Admitting: Internal Medicine

## 2023-11-02 ENCOUNTER — Telehealth: Payer: Self-pay | Admitting: Internal Medicine

## 2023-11-02 NOTE — Telephone Encounter (Signed)
 LVM to come in tomorrow to discuss

## 2023-11-03 ENCOUNTER — Ambulatory Visit (INDEPENDENT_AMBULATORY_CARE_PROVIDER_SITE_OTHER): Admitting: Internal Medicine

## 2023-11-03 ENCOUNTER — Encounter: Payer: Self-pay | Admitting: Internal Medicine

## 2023-11-03 VITALS — BP 128/66 | HR 67 | Temp 98.0°F | Resp 12 | Ht 64.5 in | Wt 161.8 lb

## 2023-11-03 DIAGNOSIS — E119 Type 2 diabetes mellitus without complications: Secondary | ICD-10-CM

## 2023-11-03 DIAGNOSIS — Z7984 Long term (current) use of oral hypoglycemic drugs: Secondary | ICD-10-CM

## 2023-11-03 MED ORDER — SITAGLIPTIN PHOSPHATE 50 MG PO TABS: 50.0000 mg | ORAL_TABLET | Freq: Every day | ORAL | 1 refills | Status: DC

## 2023-11-03 NOTE — Progress Notes (Signed)
 Patient Care Team: [name], MD as PCP - General (Internal Medicine) Jake Bathe, MD as PCP - Cardiology (Cardiology)  Visit Date: 11/03/23  Subjective:   Chief Complaint  Patient presents with   Medication Management    Would like something other than metformin.  Causes nausea and stomach feels uncomfortable   Patient ZO:XWRUEAVW Luse,Male DOB:26-Jun-1956,67 y.o. UJW:[id number]   68 y.o. Male presents today for Medication Management for Metformin. Patient has a past medical history of Glucose Intolerance. Last seen on 3/18 regarding his A1c, which was 6.5, an improvement from 7.4 in January 2025. He mentioned that after increasing his Metformin from 500 mg daily to twice daily he did initially have some nausea that lasted for the week, but had subsided. Today he says that he is started to experience nausea and general GI discomfort again and would like to try a different medication for glucose control. He agrees to seeing an Actor for further management.  Past Medical History:  Diagnosis Date   Acute pancreatitis 02/03/2014; 05/12/2016   Arthritis    "shoulders" (05/12/2016)   CAD (coronary artery disease)    a. L&R cath 07/09/15 normal LV function, no MR or AS, normal R heart pressure, No pulmonary HTN, 70% RCA s/p DES   Headache(784.0)    "couple times/month" (05/12/2016)   Heart murmur    Hypertension    Migraine    "might have 5-6/year" (05/12/2016)   Pneumonia 1960; ~ 1992   Pre-diabetes    Ringing in the ears    "continuous ringing in my ears" (05/12/2016)   Shingles     Allergies  Allergen Reactions   Zenpep [Pancrelipase (Lip-Prot-Amyl)] Nausea And Vomiting    VIOLENT VOMITING    Family History  Problem Relation Age of Onset   Lung cancer Mother    Lung cancer Father    Cardiomyopathy Brother    Colon cancer Neg Hx    Social History   Social History Narrative   Right handed. Drinks caffeine; does not smoke; social alcohol consumption. Two  story home. Married. 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 Walks up to 3 miles daily.   Review of Systems  Gastrointestinal:  Positive for abdominal pain (discomfort) and nausea.     Objective:  Vitals: BP 128/66 (BP Location: Right Arm, Patient Position: Sitting, Cuff Size: Normal)   Pulse 67   Temp 98 F (36.7 C) (Tympanic)   Resp 12   Ht 5' 4.5" (1.638 m)   Wt 161 lb 12.8 oz (73.4 kg)   SpO2 96%   BMI 27.34 kg/m   Physical Exam Vitals and nursing note reviewed.  Constitutional:      General: He is not in acute distress.    Appearance: Normal appearance. He is not ill-appearing.  HENT:     Head: Normocephalic and atraumatic.  Pulmonary:     Effort: Pulmonary effort is normal.  Skin:    General: Skin is warm and dry.  Neurological:     Mental Status: He is alert and oriented to person, place, and time. Mental status is at baseline.  Psychiatric:        Mood and Affect: Mood normal.        Behavior: Behavior normal.        Thought Content: Thought content normal.        Judgment: Judgment normal.     Results:  Studies Obtained And Personally Reviewed  By Me: Labs:     Component Value Date/Time   NA 138 02/13/2023 1134   K 5.2 02/13/2023 1134   CL 101 02/13/2023 1134   CO2 29 02/13/2023 1134   GLUCOSE 133 (H) 02/13/2023 1134   BUN 16 03/13/2023 1209   CREATININE 1.15 03/13/2023 1209   CALCIUM 9.7 02/13/2023 1134   PROT 7.2 02/13/2023 1134   ALBUMIN 4.3 09/19/2016 2042   AST 28 02/13/2023 1134   ALT 36 02/13/2023 1134   ALKPHOS 52 09/19/2016 2042   BILITOT 0.7 02/13/2023 1134   GFRNONAA 66 12/07/2020 1104   GFRAA 76 12/07/2020 1104    Lab Results  Component Value Date   WBC 6.9 02/13/2023   HGB 13.7 02/13/2023   HCT 41.6 02/13/2023   MCV 84.9 02/13/2023   PLT 234 02/13/2023   Lab Results  Component Value Date   CHOL 100 02/13/2023   HDL 43 02/13/2023   LDLCALC 34 02/13/2023   TRIG  149 02/13/2023   CHOLHDL 2.3 02/13/2023   Lab Results  Component Value Date   HGBA1C 6.5 (H) 10/16/2023    Lab Results  Component Value Date   PSA 0.66 02/13/2023   PSA 0.66 02/08/2022   PSA 0.70 12/07/2020     Assessment & Plan:   Adult Onset Diabetes Mellitus: Seen on 3/18 regarding his A1c, which was 6.5, an improvement from 7.4 in January 2025. He had mentioned some GI side effects after increasing his Metformin from 500 mg daily to twice daily, but those had subsided after a week. Unfortunately he has started to experience nausea and general GI discomfort again and wanted to discuss alternatives. Discussed a referral to Endocrinology, which he is agreeable to, so will go ahead and refer him.   I,Emily Lagle,acting as a Neurosurgeon for [name], MD.,have documented all relevant documentation on the behalf of [name], MD,as directed by  [name], MD while in the presence of [name], MD.   I, [name], MD, have reviewed all documentation for this visit. The documentation on 11/03/23 for the exam, diagnosis, procedures, and orders are all accurate and complete.

## 2023-11-03 NOTE — Patient Instructions (Addendum)
 Stop Metformin. Change to Januvia 50 mg daily. Referral to Endocrinology. Continue to monitor accuchceks at home. F/u with Hgb AIC either here or Endocrinology in about 6 weeks. Patient understand it may take a few weeks to get appt with Endocrinology. A referral was placed today. Has CPE here in July

## 2023-11-11 ENCOUNTER — Other Ambulatory Visit: Payer: Self-pay | Admitting: Cardiology

## 2023-11-30 ENCOUNTER — Other Ambulatory Visit: Payer: Self-pay | Admitting: Cardiology

## 2023-12-01 ENCOUNTER — Other Ambulatory Visit: Payer: Self-pay | Admitting: Internal Medicine

## 2023-12-08 DIAGNOSIS — M7661 Achilles tendinitis, right leg: Secondary | ICD-10-CM | POA: Diagnosis not present

## 2023-12-08 DIAGNOSIS — M722 Plantar fascial fibromatosis: Secondary | ICD-10-CM | POA: Diagnosis not present

## 2023-12-30 ENCOUNTER — Other Ambulatory Visit: Payer: Self-pay | Admitting: Internal Medicine

## 2024-01-19 ENCOUNTER — Encounter (HOSPITAL_BASED_OUTPATIENT_CLINIC_OR_DEPARTMENT_OTHER): Payer: Self-pay | Admitting: Cardiology

## 2024-01-19 ENCOUNTER — Ambulatory Visit (HOSPITAL_BASED_OUTPATIENT_CLINIC_OR_DEPARTMENT_OTHER): Admitting: Cardiology

## 2024-01-19 VITALS — BP 124/70 | HR 52 | Ht 64.5 in | Wt 153.8 lb

## 2024-01-19 DIAGNOSIS — I2583 Coronary atherosclerosis due to lipid rich plaque: Secondary | ICD-10-CM | POA: Diagnosis not present

## 2024-01-19 DIAGNOSIS — I2510 Atherosclerotic heart disease of native coronary artery without angina pectoris: Secondary | ICD-10-CM | POA: Diagnosis not present

## 2024-01-19 DIAGNOSIS — I10 Essential (primary) hypertension: Secondary | ICD-10-CM | POA: Diagnosis not present

## 2024-01-19 DIAGNOSIS — I4891 Unspecified atrial fibrillation: Secondary | ICD-10-CM | POA: Diagnosis not present

## 2024-01-19 NOTE — Progress Notes (Signed)
 Cardiology Office Note:  .   Date:  01/19/2024  ID:  David Cantu, DOB [birth year], MRN [id number] PCP: Sylvan Evener, MD   [facility] Cardiologist:  Dorothye Gathers, MD     History of Present Illness: .   David Cantu is a 68 y.o. male Discussed the use of AI scribe software for clinical note transcription with the patient, who gave verbal consent to proceed.  History of Present Illness David Cantu is a 68 year old male with coronary artery disease who presents for follow-up. He was referred by [name], works in Vascular.  He has a history of coronary artery disease with a right coronary artery stent placed in 2016 due to unstable angina. Currently, he is asymptomatic with no anginal symptoms. He is taking aspirin  81 mg daily, carvedilol  6.25 mg twice daily, Zetia  10 mg daily, and rosuvastatin  40 mg daily. His LDL was recently measured at 34.  He is actively managing his diabetes and is working to control his A1c levels. His last A1c was 6.5 in March, and he is due for another test next month. He monitors his glucose levels daily, reporting values between just below 100 to 120. He has been avoiding sugars and is trying to improve his diet. He switched from metformin  to Januvia  due to gastrointestinal side effects, which have since resolved.  He is engaged in physical activity, attending the gym regularly where he performs cardio exercises, including five miles on the bike, and strength training. He is recovering from Achilles surgery performed in September, which he describes as a slow healing process.  He has a family history of cardiac issues, with a brother who has an ICD due to arrhythmogenic right ventricular dysplasia. He is concerned about his cardiac health due to recent events involving friends who suffered cardiac events despite normal EKGs.  He has experienced significant weight loss, going from 180 pounds to 153 pounds, and is focused  on maintaining a healthy lifestyle.      ROS:   Studies Reviewed: [name]   EKG Interpretation Date/Time:  Friday January 19 2024 11:47:14 EDT Ventricular Rate:  52 PR Interval:  158 QRS Duration:  86 QT Interval:  416 QTC Calculation: 386 R Axis:   62  Text Interpretation: Sinus bradycardia with sinus arrhythmia When compared with ECG of 10-Jul-2015 04:29, No significant change was found Confirmed by Dorothye Gathers ([id number]) on 01/19/2024 11:55:48 AM    Results LABS A1c: 6.5 (09/2023) LDL: 34 Risk Assessment/Calculations:            Physical Exam:   VS:  BP 124/70   Pulse (!) 52   Ht 5' 4.5 (1.638 m)   Wt 153 lb 12.8 oz (69.8 kg)   SpO2 95%   BMI 25.99 kg/m    Wt Readings from Last 3 Encounters:  01/19/24 153 lb 12.8 oz (69.8 kg)  11/03/23 161 lb 12.8 oz (73.4 kg)  09/07/23 173 lb (78.5 kg)    GEN: Well nourished, well developed in no acute distress NECK: No JVD; No carotid bruits CARDIAC: RRR, no murmurs, no rubs, no gallops RESPIRATORY:  Clear to auscultation without rales, wheezing or rhonchi  ABDOMEN: Soft, non-tender, non-distended EXTREMITIES:  No edema; No deformity   ASSESSMENT AND PLAN: .    Assessment and Plan Assessment & Plan Coronary artery disease Coronary artery disease with right coronary artery stent placement in 2016 for unstable angina. Currently asymptomatic with no anginal symptoms. LDL cholesterol is well-controlled at 34 mg/dL  with current medication regimen. Coronary CT scan discussed but not indicated due to lack of symptoms and known stent placement. Current management is appropriate given the absence of symptoms. - Continue aspirin  81 mg oral daily - Continue carvedilol  6.25 mg oral bid - Continue ezetimibe  10 mg oral daily - Continue rosuvastatin  40 mg oral daily  Essential hypertension Blood pressure is well-controlled on current medication regimen. - Continue carvedilol  6.25 mg oral bid - Continue lisinopril  10 mg oral daily  Type 2  diabetes mellitus Type 2 diabetes mellitus with recent A1c of 6.5% in March. Reports improved glucose control with current regimen and lifestyle changes. Metformin  was discontinued due to gastrointestinal side effects, and Januvia  was started with good tolerance and improved glucose readings. - Continue Januvia  as prescribed - Monitor blood glucose levels regularly - Repeat A1c test next month  Achilles tendon surgery recovery Post-operative recovery from Achilles tendon surgery in September. Reports slow healing process, consistent with expected recovery timeline due to anatomical location and blood flow challenges.         Dispo: 1 yr  Signed, Dorothye Gathers, MD

## 2024-01-29 ENCOUNTER — Encounter: Payer: Self-pay | Admitting: Internal Medicine

## 2024-01-29 ENCOUNTER — Other Ambulatory Visit: Payer: Self-pay | Admitting: Internal Medicine

## 2024-01-29 ENCOUNTER — Other Ambulatory Visit: Payer: Self-pay

## 2024-01-29 DIAGNOSIS — E119 Type 2 diabetes mellitus without complications: Secondary | ICD-10-CM

## 2024-01-29 MED ORDER — SITAGLIPTIN PHOSPHATE 50 MG PO TABS: 50.0000 mg | ORAL_TABLET | Freq: Every day | ORAL | 0 refills | Status: DC

## 2024-02-03 ENCOUNTER — Other Ambulatory Visit: Payer: Self-pay | Admitting: Cardiology

## 2024-02-05 ENCOUNTER — Other Ambulatory Visit: Payer: Self-pay | Admitting: Cardiology

## 2024-02-08 ENCOUNTER — Other Ambulatory Visit: Payer: Self-pay | Admitting: Cardiology

## 2024-02-19 ENCOUNTER — Other Ambulatory Visit: Payer: Medicare Other

## 2024-02-19 DIAGNOSIS — Z0000 Encounter for general adult medical examination without abnormal findings: Secondary | ICD-10-CM

## 2024-02-19 DIAGNOSIS — Z8679 Personal history of other diseases of the circulatory system: Secondary | ICD-10-CM

## 2024-02-19 DIAGNOSIS — K861 Other chronic pancreatitis: Secondary | ICD-10-CM | POA: Diagnosis not present

## 2024-02-19 DIAGNOSIS — I10 Essential (primary) hypertension: Secondary | ICD-10-CM | POA: Diagnosis not present

## 2024-02-19 DIAGNOSIS — K219 Gastro-esophageal reflux disease without esophagitis: Secondary | ICD-10-CM

## 2024-02-19 DIAGNOSIS — E119 Type 2 diabetes mellitus without complications: Secondary | ICD-10-CM
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
## 2024-02-20 ENCOUNTER — Encounter: Payer: Self-pay | Admitting: Internal Medicine

## 2024-02-20 ENCOUNTER — Ambulatory Visit: Payer: Medicare Other | Admitting: Internal Medicine

## 2024-02-20 VITALS — BP 130/80 | HR 58 | Ht 64.5 in | Wt 156.0 lb

## 2024-02-20 DIAGNOSIS — I10 Essential (primary) hypertension: Secondary | ICD-10-CM | POA: Diagnosis not present

## 2024-02-20 DIAGNOSIS — E785 Hyperlipidemia, unspecified: Secondary | ICD-10-CM | POA: Diagnosis not present

## 2024-02-20 DIAGNOSIS — E119 Type 2 diabetes mellitus without complications: Secondary | ICD-10-CM

## 2024-02-20 DIAGNOSIS — K219 Gastro-esophageal reflux disease without esophagitis: Secondary | ICD-10-CM

## 2024-02-20 DIAGNOSIS — Z8719 Personal history of other diseases of the digestive system: Secondary | ICD-10-CM

## 2024-02-20 DIAGNOSIS — Z0000 Encounter for general adult medical examination without abnormal findings: Secondary | ICD-10-CM | POA: Diagnosis not present

## 2024-02-20 DIAGNOSIS — Z7984 Long term (current) use of oral hypoglycemic drugs: Secondary | ICD-10-CM

## 2024-02-20 DIAGNOSIS — Z9889 Other specified postprocedural states: Secondary | ICD-10-CM

## 2024-02-20 DIAGNOSIS — E0849 Diabetes mellitus due to underlying condition with other diabetic neurological complication: Secondary | ICD-10-CM | POA: Insufficient documentation

## 2024-02-20 DIAGNOSIS — Z8679 Personal history of other diseases of the circulatory system: Secondary | ICD-10-CM

## 2024-02-20 LAB — CBC WITH DIFFERENTIAL/PLATELET
Absolute Lymphocytes: 851 {cells}/uL (ref 850–3900)
Absolute Monocytes: 570 {cells}/uL (ref 200–950)
Basophils Absolute: 47 {cells}/uL (ref 0–200)
Basophils Relative: 0.7 %
Eosinophils Absolute: 630 {cells}/uL — ABNORMAL HIGH (ref 15–500)
Eosinophils Relative: 9.4 %
HCT: 42.2 % (ref 38.5–50.0)
Hemoglobin: 13.4 g/dL (ref 13.2–17.1)
MCH: 28 pg (ref 27.0–33.0)
MCHC: 31.8 g/dL — ABNORMAL LOW (ref 32.0–36.0)
MCV: 88.3 fL (ref 80.0–100.0)
MPV: 10.9 fL (ref 7.5–12.5)
Monocytes Relative: 8.5 %
Neutro Abs: 4603 {cells}/uL (ref 1500–7800)
Neutrophils Relative %: 68.7 %
Platelets: 201 Thousand/uL (ref 140–400)
RBC: 4.78 Million/uL (ref 4.20–5.80)
RDW: 12.3 % (ref 11.0–15.0)
Total Lymphocyte: 12.7 %
WBC: 6.7 Thousand/uL (ref 3.8–10.8)

## 2024-02-20 LAB — COMPLETE METABOLIC PANEL WITHOUT GFR
AG Ratio: 1.8 (calc) (ref 1.0–2.5)
ALT: 34 U/L (ref 9–46)
AST: 28 U/L (ref 10–35)
Albumin: 4.6 g/dL (ref 3.6–5.1)
Alkaline phosphatase (APISO): 43 U/L (ref 35–144)
BUN: 18 mg/dL (ref 7–25)
CO2: 29 mmol/L (ref 20–32)
Calcium: 9.7 mg/dL (ref 8.6–10.3)
Chloride: 104 mmol/L (ref 98–110)
Creat: 1.17 mg/dL (ref 0.70–1.35)
Globulin: 2.5 g/dL (ref 1.9–3.7)
Glucose, Bld: 134 mg/dL — ABNORMAL HIGH (ref 65–99)
Potassium: 5 mmol/L (ref 3.5–5.3)
Sodium: 140 mmol/L (ref 135–146)
Total Bilirubin: 0.5 mg/dL (ref 0.2–1.2)
Total Protein: 7.1 g/dL (ref 6.1–8.1)

## 2024-02-20 LAB — MICROALBUMIN / CREATININE URINE RATIO
Creatinine, Urine: 165 mg/dL (ref 20–320)
Microalb Creat Ratio: 3 mg/g{creat} (ref <30)
Microalb, Ur: 0.5 mg/dL

## 2024-02-20 LAB — HEMOGLOBIN A1C
Hgb A1c MFr Bld: 6.5 % — ABNORMAL HIGH (ref <5.7)
Mean Plasma Glucose: 140 mg/dL
eAG (mmol/L): 7.7 mmol/L

## 2024-02-20 LAB — LIPID PANEL
Cholesterol: 97 mg/dL (ref <200)
HDL: 45 mg/dL (ref >=40)
LDL Cholesterol (Calc): 34 mg/dL
Non-HDL Cholesterol (Calc): 52 mg/dL (ref <130)
Total CHOL/HDL Ratio: 2.2 (calc) (ref <5.0)
Triglycerides: 92 mg/dL (ref <150)

## 2024-02-20 LAB — PSA: PSA: 0.6 ng/mL (ref <=4.00)

## 2024-02-20 NOTE — Progress Notes (Signed)
 Subjective:   David Cantu is a 68 y.o. male who presents for Medicare Annual/Subsequent preventive examination.  Visit Complete: In person  Patient Medicare AWV questionnaire was completed by the patient on 02/20/24; I have confirmed that all information answered by patient is correct and no changes since this date.  Cardiac Risk Factors include: advanced age (>24men, >27 women);dyslipidemia;hypertension;male gender     Objective:    Today's Vitals   02/20/24 1005  Weight: 156 lb (70.8 kg)  Height: 5' 4.5 (1.638 m)  Blood pressure: 130/80 Pulse rate: 58  Body mass index is 26.36 kg/m.     02/14/2023    9:49 AM 02/11/2022    9:40 AM 12/21/2020    9:40 AM 06/19/2020    9:18 AM 10/21/2018    8:11 PM 09/19/2016    8:42 PM 05/12/2016   10:21 PM  Advanced Directives  Does Patient Have a Medical Advance Directive? No No No No No  No    Would patient like information on creating a medical advance directive? No - Patient declined No - Patient declined     No - patient declined information      Data saved with a previous flowsheet row definition    Current Medications (verified) Outpatient Encounter Medications as of 02/20/2024  Medication Sig   acetaminophen  (TYLENOL ) 325 MG tablet Take 325-650 mg by mouth at bedtime as needed (for shoulder pain).    aspirin  81 MG chewable tablet Chew 1 tablet (81 mg total) by mouth daily.   Blood Glucose Monitoring Suppl (BLOOD GLUCOSE MONITOR& DTX APP) w/ Well Device KIT Check glucose twice a day dispense lancets and strips covered by insurance   carvedilol  (COREG ) 6.25 MG tablet TAKE 1 TABLET(6.25 MG) BY MOUTH TWICE DAILY WITH A MEAL   ezetimibe  (ZETIA ) 10 MG tablet TAKE 1 TABLET(10 MG) BY MOUTH DAILY   glucose blood (ONETOUCH VERIO) test strip USE TO CHECK BLOOD GLUCOSE TWICE DAILY   HYDROcodone  bit-homatropine (HYCODAN) 5-1.5 MG/5ML syrup Take 5 mLs by mouth every 8 (eight) hours as needed for cough.   JANUVIA  50 MG tablet TAKE 1  TABLET(50 MG) BY MOUTH DAILY   Lancets (ONETOUCH DELICA PLUS LANCET33G) MISC USE TO CHECK BLOOD GLUCOSE TWICE DAILY   lisinopril  (ZESTRIL ) 10 MG tablet TAKE 1 TABLET(10 MG) BY MOUTH DAILY   nitroGLYCERIN  (NITROSTAT ) 0.4 MG SL tablet Place 1 tablet (0.4 mg total) under the tongue every 5 (five) minutes as needed for chest pain.   pantoprazole  (PROTONIX ) 40 MG tablet TAKE 1 TABLET(40 MG) BY MOUTH DAILY   rosuvastatin  (CRESTOR ) 40 MG tablet TAKE 1 TABLET(40 MG) BY MOUTH DAILY   sitaGLIPtin  (JANUVIA ) 50 MG tablet Take 1 tablet (50 mg total) by mouth daily.   metFORMIN  (GLUCOPHAGE ) 500 MG tablet TAKE 1 TABLET(500 MG) BY MOUTH DAILY WITH SUPPER (Patient not taking: Reported on 01/19/2024)   [DISCONTINUED] oseltamivir  (TAMIFLU ) 75 MG capsule Take 1 capsule (75 mg total) by mouth 2 (two) times daily. (Patient not taking: Reported on 02/20/2024)   No facility-administered encounter medications on file as of 02/20/2024.    Allergies (verified) Zenpep  [pancrelipase  (lip-prot-amyl)]   History: Past Medical History:  Diagnosis Date   Acute pancreatitis 02/03/2014; 05/12/2016   Arthritis    shoulders (05/12/2016)   CAD (coronary artery disease)    a. L&R cath 07/09/15 normal LV function, no MR or AS, normal R heart pressure, No pulmonary HTN, 70% RCA s/p DES   Headache(784.0)    couple times/month (05/12/2016)  Heart murmur    Hypertension    Migraine    might have 5-6/year (05/12/2016)   Pneumonia 1960; ~ 1992   Pre-diabetes    Ringing in the ears    continuous ringing in my ears (05/12/2016)   Shingles    Past Surgical History:  Procedure Laterality Date   CARDIAC CATHETERIZATION N/A 07/09/2015   Procedure: Right/Left Heart Cath and Coronary Angiography;  Surgeon: Oneil JAYSON Parchment, MD;  Location: [facility] INVASIVE CV LAB;  Service: Cardiovascular;  Laterality: N/A;   CARDIAC CATHETERIZATION N/A 07/09/2015   Procedure: Intravascular Pressure Wire/FFR Study;  Surgeon: Lonni JONETTA Cash, MD;   Location: [facility] INVASIVE CV LAB;  Service: Cardiovascular;  Laterality: N/A;   CARDIAC CATHETERIZATION N/A 07/09/2015   Procedure: Coronary Stent Intervention;  Surgeon: Lonni JONETTA Cash, MD;  Location: [facility] INVASIVE CV LAB;  Service: Cardiovascular;  Laterality: N/A;  Distal RCA   SHOULDER ARTHROSCOPY W/ ROTATOR CUFF REPAIR Left 2011   TRACHEOSTOMY  1960   TRACHEOSTOMY CLOSURE  1960   Family History  Problem Relation Age of Onset   Lung cancer Mother    Lung cancer Father    Cardiomyopathy Brother    Colon cancer Neg Hx    Social History   Socioeconomic History   Marital status: Married    Spouse name: Not on file   Number of children: 0   Years of education: Not on file   Highest education level: 12th grade  Occupational History   Occupation: Self employed  Tobacco Use   Smoking status: Never   Smokeless tobacco: Never  Vaping Use   Vaping status: Never Used  Substance and Sexual Activity   Alcohol use: Not Currently    Alcohol/week: 0.0 standard drinks of alcohol    Comment: 05/12/2016 might have a few drinks/month   Drug use: No   Sexual activity: Yes  Other Topics Concern   Not on file  Social History Narrative   Right handed. Drinks caffeine; does not smoke; social alcohol consumption. Two story home. Married. 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 Moved here from [place]  around 2007. Walks up to 3 miles daily.   Social Drivers of Corporate investment banker Strain: Low Risk  (02/13/2024)   Overall Financial Resource Strain (CARDIA)    Difficulty of Paying Living Expenses: Not hard at all  Food Insecurity: No Food Insecurity (02/13/2024)   Hunger Vital Sign    Worried About Running Out of Food in the Last Year: Never true    Ran Out of Food in the Last Year: Never true  Transportation Needs: No Transportation Needs (02/13/2024)   PRAPARE - Administrator, Civil Service (Medical): No    Lack of Transportation  (Non-Medical): No  Physical Activity: Sufficiently Active (02/13/2024)   Exercise Vital Sign    Days of Exercise per Week: 5 days    Minutes of Exercise per Session: 60 min  Stress: No Stress Concern Present (02/13/2024)   [employer] of Occupational Health - Occupational Stress Questionnaire    Feeling of Stress: Not at all  Social Connections: Moderately Integrated (02/13/2024)   Social Connection and Isolation Panel    Frequency of Communication with Friends and Family: Twice a week    Frequency of Social Gatherings with Friends and Family: Once a week    Attends Religious Services: 1 to 4 times per year    Active Member of Clubs or Organizations: No    Attends  Club or Organization Meetings: Never    Marital Status: Married    Tobacco Counseling Counseling given: Not Answered   Clinical Intake:  Pre-visit preparation completed: Yes  Pain : No/denies pain     BMI - recorded: 26.36 Nutritional Status: BMI 25 -29 Overweight Diabetes: No  How often do you need to have someone help you when you read instructions, pamphlets, or other written materials from your doctor or pharmacy?: 1 - Never  Interpreter Needed?: No  Information entered by :: Kathlynn Porto, CMA   Activities of Daily Living    02/20/2024    9:57 AM 02/13/2024    7:40 PM  In your present state of health, do you have any difficulty performing the following activities:  Hearing? 0 0  Vision? 0 0  Difficulty concentrating or making decisions? 0 0  Walking or climbing stairs? 0 0  Dressing or bathing? 0 0  Doing errands, shopping? 0 0  Preparing Food and eating ? N N  Using the Toilet? N N  In the past six months, have you accidently leaked urine? N N  Do you have problems with loss of bowel control? N N  Managing your Medications? N N  Managing your Finances? N N  Housekeeping or managing your Housekeeping? N N    Patient Care Team: Perri Ronal PARAS, MD as PCP - General (Internal  Medicine) Jeffrie Oneil BROCKS, MD as PCP - Cardiology (Cardiology)  Indicate any recent Medical Services you may have received from other than [facility] providers in the past year (date may be approximate).     Assessment:   This is a routine wellness examination for [employer].  Hearing/Vision screen No results found.   Goals Addressed   None    Depression Screen    02/20/2024   10:04 AM 02/14/2023    9:46 AM 02/11/2022    9:41 AM 12/08/2020    2:14 PM 09/06/2019   11:20 AM 09/06/2019   11:03 AM 07/18/2018   10:52 AM  PHQ 2/9 Scores  PHQ - 2 Score 0 0 0 0 0 0 0    Fall Risk    02/20/2024    9:58 AM 02/13/2024    7:40 PM 02/14/2023    9:49 AM 02/11/2022    9:41 AM 12/21/2020    9:40 AM  Fall Risk   Falls in the past year? 0 0 0 0 0  Number falls in past yr: 0 0 0 0 0  Injury with Fall? 0 0 0 0 0  Risk for fall due to : No Fall Risks  No Fall Risks No Fall Risks   Follow up Falls prevention discussed;Education provided;Falls evaluation completed  Falls evaluation completed Falls evaluation completed       Data saved with a previous flowsheet row definition    MEDICARE RISK AT HOME: Medicare Risk at Home Any stairs in or around the home?: Yes If so, are there any without handrails?: No Home free of loose throw rugs in walkways, pet beds, electrical cords, etc?: Yes Adequate lighting in your home to reduce risk of falls?: No Life alert?: No Use of a cane, walker or w/c?: No Grab bars in the bathroom?: No Shower chair or bench in shower?: No Elevated toilet seat or a handicapped toilet?: No  TIMED UP AND GO:  Was the test performed?  No    Cognitive Function:        02/20/2024   10:04 AM 02/14/2023    9:50 AM  02/11/2022    9:42 AM  6CIT Screen  What Year? 0 points  0 points  What month? 0 points  0 points  What time? 0 points 0 points 0 points  Count back from 20 0 points 0 points 0 points  Months in reverse 0 points 0 points 0 points  Repeat phrase  0 points 0 points   Total Score   0 points    Immunizations Immunization History  Administered Date(s) Administered    sv, Bivalent, Protein Subunit Rsvpref,pf (Abrysvo) 07/18/2023   Influenza,inj,Quad PF,6+ Mos 05/01/2014, 07/10/2015, 04/21/2016, 04/20/2017, 06/20/2018, 04/12/2019   Influenza,inj,quad, With Preservative 04/03/2017   Influenza-Unspecified 07/18/2023   PFIZER(Purple Top)SARS-COV-2 Vaccination 08/21/2019, 09/10/2019, 06/20/2020   PNEUMOCOCCAL CONJUGATE-20 02/11/2022   Pfizer(Comirnaty)Fall Seasonal Vaccine 12 years and older 07/18/2023   Pneumococcal Polysaccharide-23 07/10/2015   Tdap 04/20/2017    TDAP status: Up to date  Flu Vaccine status: Up to date  Pneumococcal vaccine status: Up to date  Covid-19 vaccine status: Information provided on how to obtain vaccines.   Qualifies for Shingles Vaccine? Yes   Zostavax completed No   Shingrix Completed?: No.    Education has been provided regarding the importance of this vaccine. Patient has been advised to call insurance company to determine out of pocket expense if they have not yet received this vaccine. Advised may also receive vaccine at local pharmacy or Health Dept. Verbalized acceptance and understanding.  Screening Tests Health Maintenance  Topic Date Due   Zoster Vaccines- Shingrix (1 of 2) Never done   INFLUENZA VACCINE  03/01/2024   Colonoscopy  08/20/2024   HEMOGLOBIN A1C  08/21/2024   FOOT EXAM  08/24/2024   OPHTHALMOLOGY EXAM  09/12/2024   Diabetic kidney evaluation - eGFR measurement  02/18/2025   Diabetic kidney evaluation - Urine ACR  02/18/2025   Medicare Annual Wellness (AWV)  02/18/2025   DTaP/Tdap/Td (2 - Td or Tdap) 04/21/2027   Pneumococcal Vaccine: 50+ Years  Completed   Hepatitis C Screening  Addressed   Hepatitis B Vaccines  Aged Out   HPV VACCINES  Aged Out   Meningococcal B Vaccine  Aged Out   COVID-19 Vaccine  Discontinued    Health Maintenance  Health Maintenance Due  Topic Date Due    Zoster Vaccines- Shingrix (1 of 2) Never done    Colorectal cancer screening: Type of screening: Colonoscopy. Completed 08/20/2014. Repeat every 10 years  Lung Cancer Screening: (Low Dose CT Chest recommended if Age 38-80 years, 20 pack-year currently smoking OR have quit w/in 15years.) does not qualify.   Additional Screening:  Hepatitis C Screening: does not qualify; Completed   Vision Screening: Recommended annual ophthalmology exams for early detection of glaucoma and other disorders of the eye. Is the patient up to date with their annual eye exam?  Yes  Who is the provider or what is the name of the office in which the patient attends annual eye exams? [facility] If pt is not established with a provider, would they like to be referred to a provider to establish care? No .   Dental Screening: Recommended annual dental exams for proper oral hygiene  Community Resource Referral / Chronic Care Management: CRR required this visit?  No   CCM required this visit?  No     Plan:     I have personally reviewed and noted the following in the patient's chart:   Medical and social history Use of alcohol, tobacco or illicit drugs  Current medications and  supplements including opioid prescriptions. Patient is not currently taking opioid prescriptions. Functional ability and status Nutritional status Physical activity Advanced directives List of other physicians Hospitalizations, surgeries, and ER visits in previous 12 months Vitals Screenings to include cognitive, depression, and falls Referrals and appointments  In addition, I have reviewed and discussed with patient certain preventive protocols, quality metrics, and best practice recommendations. A written personalized care plan for preventive services as well as general preventive health recommendations were provided to patient.     Araceli Zelda, CMA   02/20/2024   After Visit Summary: (In Person-Printed) AVS  printed and given to the patient  I, Ronal JINNY Hailstone, MD, have reviewed all documentation for this visit. The documentation on 02/24/24 for the exam, diagnosis, procedures, and orders are all accurate and complete.

## 2024-02-22 ENCOUNTER — Telehealth: Payer: Self-pay

## 2024-02-22 NOTE — Progress Notes (Signed)
   02/22/2024  Patient ID: David Cantu, male   DOB: [birth year], 68 y.o.   MRN: [id number]  This patient is appearing on a report for being at risk of failing the adherence measure for diabetes medications this calendar year.   Medication: Januvia  50mg  Last fill date: 01/29/24 for 90 day supply  Insurance report was not up to date. No action needed at this time.   Channing DELENA Mealing, PharmD, DPLA

## 2024-02-23 ENCOUNTER — Encounter: Payer: Self-pay | Admitting: Internal Medicine

## 2024-02-23 ENCOUNTER — Ambulatory Visit: Admitting: Cardiology

## 2024-02-24 ENCOUNTER — Other Ambulatory Visit: Payer: Self-pay | Admitting: Cardiology

## 2024-03-06 DIAGNOSIS — K080 Exfoliation of teeth due to systemic causes: Secondary | ICD-10-CM | POA: Diagnosis not present

## 2024-03-12 ENCOUNTER — Encounter: Payer: Self-pay | Admitting: Internal Medicine

## 2024-03-12 ENCOUNTER — Ambulatory Visit: Admitting: Internal Medicine

## 2024-03-12 VITALS — BP 160/80 | HR 62 | Ht 64.5 in | Wt 158.0 lb

## 2024-03-12 DIAGNOSIS — I10 Essential (primary) hypertension: Secondary | ICD-10-CM

## 2024-03-12 MED ORDER — OLMESARTAN MEDOXOMIL 20 MG PO TABS: 20.0000 mg | ORAL_TABLET | Freq: Every day | ORAL | 0 refills | Status: DC

## 2024-03-12 NOTE — Progress Notes (Addendum)
 Patient Care Team: Perri Ronal PARAS, MD as PCP - General (Internal Medicine) Jeffrie Oneil BROCKS, MD as PCP - Cardiology (Cardiology)  Visit Date: 03/12/24  Subjective:   Chief Complaint  Patient presents with   Hypertension   Vitals:   03/12/24 1413 03/12/24 1421 03/12/24 1433  BP: (!) 160/80 (!) 150/80 (!) 160/80   Patient PI:Ojtmzwrz Cuadra,Male DOB:December 30, 1955,67 y.o. FMW:[id number]   68 y.o.Male presents today for a visit regarding his blood pressure control. Patient has a past medical history of HTN; CAD; Impaired Glucose Tolerance. Hypertension treated with Lisinopril  10 mg daily and Carvedilol  6.25 mg twice daily; Blood Pressure: elevated today at 160/80, 8 minutes later 150/80, and 12 minutes later 160/80. On 02/20/2024 he was seen for his annual visit, with a high-normal blood pressure reading of 130/80. He was instructed to monitor his blood pressures at home and report readings back, which can be reviewed in detail under Studies Obtained And Personally Reviewed By Me; out of 9 readings, while on Lisinopril  10 mg, his BP was elevated four times, and after starting Lisinopril  20 mg, BP was elevated 11 out of 25 readings. He brought in his at-home automatic blood pressure cuff to calibrate, and found that his device readings did not match our readings.   Past Medical History:  Diagnosis Date   Acute pancreatitis 02/03/2014; 05/12/2016   Arthritis    shoulders (05/12/2016)   CAD (coronary artery disease)    a. L&R cath 07/09/15 normal LV function, no MR or AS, normal R heart pressure, No pulmonary HTN, 70% RCA s/p DES   Headache(784.0)    couple times/month (05/12/2016)   Heart murmur    Hypertension    Migraine    might have 5-6/year (05/12/2016)   Pneumonia 1960; ~ 1992   Pre-diabetes    Ringing in the ears    continuous ringing in my ears (05/12/2016)   Shingles     Allergies  Allergen Reactions   Zenpep  [Pancrelipase  (Lip-Prot-Amyl)] Nausea And Vomiting     VIOLENT VOMITING   Immunization History  Administered Date(s) Administered    sv, Bivalent, Protein Subunit Rsvpref,pf (Abrysvo) 07/18/2023   Influenza,inj,Quad PF,6+ Mos 05/01/2014, 07/10/2015, 04/21/2016, 04/20/2017, 06/20/2018, 04/12/2019   Influenza,inj,quad, With Preservative 04/03/2017   Influenza-Unspecified 07/18/2023, 03/04/2024   PFIZER(Purple Top)SARS-COV-2 Vaccination 08/21/2019, 09/10/2019, 06/20/2020   PNEUMOCOCCAL CONJUGATE-20 02/11/2022   Pfizer(Comirnaty)Fall Seasonal Vaccine 12 years and older 07/18/2023   Pneumococcal Polysaccharide-23 07/10/2015   Tdap 04/20/2017   Zoster Recombinant(Shingrix) 03/04/2024   Past Surgical History:  Procedure Laterality Date   CARDIAC CATHETERIZATION N/A 07/09/2015   Procedure: Right/Left Heart Cath and Coronary Angiography;  Surgeon: Oneil BROCKS Jeffrie, MD;  Location: [facility] INVASIVE CV LAB;  Service: Cardiovascular;  Laterality: N/A;   CARDIAC CATHETERIZATION N/A 07/09/2015   Procedure: Intravascular Pressure Wire/FFR Study;  Surgeon: Lonni JONETTA Cash, MD;  Location[facility] INVASIVE CV LAB;  Service: Cardiovascular;  Laterality: N/A;   CARDIAC CATHETERIZATION N/A 07/09/2015   Procedure: Coronary Stent Intervention;  Surgeon: Lonni JONETTA Cash, MD;  Location: [facility] INVASIVE CV LAB;  Service: Cardiovascular;  Laterality: N/A;  Distal RCA   SHOULDER ARTHROSCOPY W/ ROTATOR CUFF REPAIR Left 2011   TRACHEOSTOMY  1960   TRACHEOSTOMY CLOSURE  1960    Family History  Problem Relation Age of Onset   Lung cancer Mother    Lung cancer Father    Cardiomyopathy Brother    Colon cancer Neg Hx    Social History   Social History Narrative   Right handed.  Drinks caffeine. Two story home. Moved to [place] from [place], [place]  circa 2007. Married w/ 3 adult children. Non-smoker, social alcohol consumption. 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 Walks up to 3 miles daily, goes to gym 5x/ week.    Review of Systems  Constitutional:   Negative for fever and malaise/fatigue.  HENT:  Negative for congestion.   Eyes:  Negative for blurred vision.  Respiratory:  Negative for cough and shortness of breath.   Cardiovascular: Negative.  Negative for chest pain, palpitations and leg swelling.  Gastrointestinal:  Negative for vomiting.  Musculoskeletal:  Negative for back pain.  Skin:  Negative for rash.  Neurological:  Negative for loss of consciousness and headaches.     Objective:  Vitals: BP (!) 160/80   Pulse 62   Ht 5' 4.5 (1.638 m)   Wt 158 lb (71.7 kg)   SpO2 98%   BMI 26.70 kg/m   Physical Exam Vitals and nursing note reviewed.  Constitutional:      General: He is not in acute distress.    Appearance: Normal appearance. He is not ill-appearing.  HENT:     Head: Normocephalic and atraumatic.  Cardiovascular:     Rate and Rhythm: Normal rate and regular rhythm.     Pulses: Normal pulses.     Heart sounds: Normal heart sounds. No murmur heard.    No friction rub. No gallop.  Pulmonary:     Effort: Pulmonary effort is normal. No respiratory distress.     Breath sounds: Normal breath sounds. No wheezing or rales.  Skin:    General: Skin is warm and dry.  Neurological:     Mental Status: He is alert and oriented to person, place, and time. Mental status is at baseline.  Psychiatric:        Mood and Affect: Mood normal.        Behavior: Behavior normal.        Thought Content: Thought content normal.        Judgment: Judgment normal.     Results:  Studies Obtained And Personally Reviewed By Me:  On Lisinopril  10 mg   Day Time Blood Pressure Pulse      9:30 134/62 56  Wednesday 4:30 136/57 60   Midnight 142/61 58      7:30 135/65 53  Thursday 10:00 138/61 55   5:00 124/57 60      11:30 140/65 59  Friday 3:00 153/72 57   6:00 142/62 58   After starting Lisinopril  20 mg daily x7 days Date Time Blood Pressure Pulse      4:00 131/63 61  Saturday 8/02 11:00 161/80 56      10:00 146/70 57   Sunday 8/03 5:00 132/62 62   11:00 146/69 59      7:00 153/75 59  Monday 8/04 2:00 115/60 60      7:00 147/76 58  Tuesday 8/05 12:00 143/68 65   5:00 141/69 68   11:00 148/71 63      8:30  144/68 61  Wednesday 1:00 158/75 57   6:30 132/66 63   11:00 140/67 58     Thursday 8/07 6:30 129/71 58   1:00 127/65 63   6:30 121/64 65   11:30 138/73 64      6:30 137/76 66  Friday 8/08 2:00 123/70 84   6:00 147/82 68   11:00 127/69 66      9:00 139/73 62  Saturday 8/09 7:30 117/55 76   Labs:  CBC w/ Differential Lab Results  Component Value Date   WBC 6.7 02/19/2024   RBC 4.78 02/19/2024   HGB 13.4 02/19/2024   HCT 42.2 02/19/2024   PLT 201 02/19/2024   MCV 88.3 02/19/2024   MCH 28.0 02/19/2024   MCHC 31.8 (L) 02/19/2024   RDW 12.3 02/19/2024   MPV 10.9 02/19/2024   LYMPHSABS 863 02/13/2023   MONOABS 0.9 09/19/2016   BASOSABS 47 02/19/2024    Comprehensive Metabolic Panel Lab Results  Component Value Date   NA 140 02/19/2024   K 5.0 02/19/2024   CL 104 02/19/2024   CO2 29 02/19/2024   GLUCOSE 134 (H) 02/19/2024   BUN 18 02/19/2024   CREATININE 1.17 02/19/2024   CALCIUM  9.7 02/19/2024   PROT 7.1 02/19/2024   ALBUMIN  4.3 09/19/2016   AST 28 02/19/2024   ALT 34 02/19/2024   ALKPHOS 52 09/19/2016   BILITOT 0.5 02/19/2024   EGFR 69 03/13/2023   GFRNONAA 66 12/07/2020   Lipid Panel  Lab Results  Component Value Date   CHOL 97 02/19/2024   HDL 45 02/19/2024   LDLCALC 34 02/19/2024   TRIG 92 02/19/2024   A1c Lab Results  Component Value Date   HGBA1C 6.5 (H) 02/19/2024   PSA Lab Results  Component Value Date   PSA 0.60 02/19/2024   PSA 0.66 02/13/2023   PSA 0.66 02/08/2022     Assessment & Plan:  Essential Hypertension. Patient has been getting elevated readings at home Meds ordered this encounter  Medications   olmesartan  (BENICAR ) 20 MG tablet    Sig: Take 1 tablet (20 mg total) by mouth daily.    Dispense:  30 tablet    Refill:  0    Hypertension treated with Lisinopril  10 mg daily and Carvedilol  6.25 mg twice daily. On 02/20/2024 he was seen for his annual visit, with a high-normal blood pressure reading of 130/80, so was instructed to monitor his blood pressures at home and report readings back, which can be reviewed in detail under Studies Obtained And Personally Reviewed By Me. He brought in his at-home automatic blood pressure cuff to calibrate, and found that his readings were not matchig our readings. Blood Pressure: elevated today at 160/80, 8 minutes later 150/80, and 12 minutes later 160/80.   Discontinuing Lisinopril  10 mg. Sending in Olmesartan  20 mg to take daily in the morning. Continue monitoring blood pressures. He will need some follow up on Olmesartan  with B-met in a few weeks.  Type 2 Diabetes Mellitus- Hgb AIC 6.5%    I,Emily Lagle,acting as a scribe for Ronal JINNY Hailstone, MD.,have documented all relevant documentation on the behalf of Ronal JINNY Hailstone, MD,as directed by  Ronal JINNY Hailstone, MD while in the presence of Ronal JINNY Hailstone, MD.  I, Ronal JINNY Hailstone, MD, have reviewed all documentation for this visit. The documentation on 03/12/2024 for the exam, diagnosis, procedures, and orders are all accurate and complete.

## 2024-03-24 ENCOUNTER — Encounter: Payer: Self-pay | Admitting: Internal Medicine

## 2024-03-24 NOTE — Patient Instructions (Signed)
 Discontinue Losartan and start olmesartan . Monitor blood pressure readings at home. Will need OV, basic metabolic panel  in about 4-6 weeks if this new med, olmesartan , agrees with him.

## 2024-03-27 DIAGNOSIS — K080 Exfoliation of teeth due to systemic causes: Secondary | ICD-10-CM | POA: Diagnosis not present

## 2024-04-01 ENCOUNTER — Encounter: Payer: Self-pay | Admitting: Internal Medicine

## 2024-04-03 ENCOUNTER — Other Ambulatory Visit: Payer: Self-pay

## 2024-04-03 MED ORDER — OLMESARTAN MEDOXOMIL 20 MG PO TABS: 20.0000 mg | ORAL_TABLET | Freq: Every day | ORAL | 1 refills | Status: AC

## 2024-04-16 ENCOUNTER — Ambulatory Visit: Admitting: Endocrinology

## 2024-04-19 NOTE — Progress Notes (Signed)
 Patient Care Team: Perri Ronal PARAS, MD as PCP - General (Internal Medicine) Jeffrie Oneil BROCKS, MD as PCP - Cardiology (Cardiology)  Visit Date: 04/23/24  Subjective:    Patient ID: David Cantu , Male   DOB: 07/31/1956, 68 y.o.    MRN: [id number]   68 y.o. Male presents today for 1 month follow up for Hypertension. Patient has a past medical history of CAD, impaired glucose tolerance.    At his last visit on 03/12/2024 his blood pressure was elevated at 160/80  and remained elevated at 150/80 8 minutes later and 160/80 12 minutes after that. He was previously on Lisinopril  10 mg daily but has since discontinued use and is now on Omlesartan 20 mg daily as well as continuing Carvediol 6.25 mg twice daily.    He said that yesterday he was feeling light headed so he checked his blood pressure and one arm was 90  and the other arm was 85. He said that he was well hydrated and fed and he had not over exert himself and that his blood pressure is usually normal. He did say that the machine he used to read his blood pressure was new. He said that he does not monitor his blood pressure every day he does it ever so often.  His blood pressure today was normal at 130/80. He was advised to monitor his blood pressure daily and to continue his current medications.    Past Medical History:  Diagnosis Date   Acute pancreatitis 02/03/2014; 05/12/2016   Arthritis    shoulders (05/12/2016)   CAD (coronary artery disease)    a. L&R cath 07/09/15 normal LV function, no MR or AS, normal R heart pressure, No pulmonary HTN, 70% RCA s/p DES   Headache(784.0)    couple times/month (05/12/2016)   Heart murmur    Hypertension    Migraine    might have 5-6/year (05/12/2016)   Pneumonia 1960; ~ 1992   Pre-diabetes    Ringing in the ears    continuous ringing in my ears (05/12/2016)   Shingles      Family History  Problem Relation Age of Onset   Lung cancer Mother    Lung cancer Father     Cardiomyopathy Brother    Colon cancer Neg Hx     Social History   Social History Narrative   Right handed. Drinks caffeine. Two story home. Moved to [place] from [place], [place]  circa 2007. Married w/ 3 adult children. Non-smoker, social alcohol consumption. 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 Walks up to 3 miles daily, goes to gym 5x/ week.       Review of Systems  All other systems reviewed and are negative.       Objective:   Vitals: BP 130/80   Pulse 60   Ht 5' 4.5 (1.638 m)   Wt 159 lb (72.1 kg)   SpO2 98%   BMI 26.87 kg/m    Physical Exam Constitutional:      General: He is not in acute distress.    Appearance: Normal appearance. He is not ill-appearing.  HENT:     Head: Normocephalic and atraumatic.  Neck:     Vascular: No carotid bruit.  Cardiovascular:     Rate and Rhythm: Normal rate and regular rhythm.     Pulses: Normal pulses.     Heart sounds: Normal heart sounds. No murmur heard.    No friction rub. No gallop.  Pulmonary:  Effort: Pulmonary effort is normal. No respiratory distress.     Breath sounds: Normal breath sounds. No wheezing or rales.  Skin:    General: Skin is warm and dry.  Neurological:     Mental Status: He is alert and oriented to person, place, and time. Mental status is at baseline.  Psychiatric:        Mood and Affect: Mood normal.        Behavior: Behavior normal.        Thought Content: Thought content normal.        Judgment: Judgment normal.       Results:     Labs:       Component Value Date/Time   NA 140 02/19/2024 0908   K 5.0 02/19/2024 0908   CL 104 02/19/2024 0908   CO2 29 02/19/2024 0908   GLUCOSE 134 (H) 02/19/2024 0908   BUN 18 02/19/2024 0908   CREATININE 1.17 02/19/2024 0908   CALCIUM  9.7 02/19/2024 0908   PROT 7.1 02/19/2024 0908   ALBUMIN  4.3 09/19/2016 2042   AST 28 02/19/2024 0908   ALT 34 02/19/2024 0908   ALKPHOS 52 09/19/2016 2042   BILITOT 0.5 02/19/2024  0908   GFRNONAA 66 12/07/2020 1104   GFRAA 76 12/07/2020 1104     Lab Results  Component Value Date   WBC 6.7 02/19/2024   HGB 13.4 02/19/2024   HCT 42.2 02/19/2024   MCV 88.3 02/19/2024   PLT 201 02/19/2024    Lab Results  Component Value Date   CHOL 97 02/19/2024   HDL 45 02/19/2024   LDLCALC 34 02/19/2024   TRIG 92 02/19/2024   CHOLHDL 2.2 02/19/2024    Lab Results  Component Value Date   HGBA1C 6.5 (H) 02/19/2024       Lab Results  Component Value Date   PSA 0.60 02/19/2024   PSA 0.66 02/13/2023   PSA 0.66 02/08/2022       Assessment & Plan:     Orders Placed This Encounter  Procedures   Basic Metabolic Panel    Hypertension: At his last visit on 03/12/2024 his blood pressure was elevated at 160/80  and remained elevated at 150/80 8 minutes later and 160/80 12 minutes after that. He was previously on Lisinopril  10 mg daily but has since discontinued use and is now on Omlesartan 20 mg daily as well as continuing Carvediol 6.25 mg twice daily.   He said that yesterday he was feeling light headed so he checked his blood pressure and one arm was 90  and the other arm was 85. He said that he was well hydrated and fed and he had not over exert himself and that his blood pressure is usually normal. He did say that the machine he used to read his blood pressure was new. He said that he does not monitor his blood pressure every day he does it ever so often.  His blood pressure today was normal at 130/80.  He was advised to monitor his blood pressure daily and to continue his current medications.  BMP ordered to check liver function.    I,Makayla C Reid,acting as a scribe for Ronal JINNY Hailstone, MD.,have documented all relevant documentation on the behalf of Ronal JINNY Hailstone, MD,as directed by  Ronal JINNY Hailstone, MD while in the presence of Ronal JINNY Hailstone, MD.

## 2024-04-23 ENCOUNTER — Encounter: Payer: Self-pay | Admitting: Internal Medicine

## 2024-04-23 ENCOUNTER — Ambulatory Visit (INDEPENDENT_AMBULATORY_CARE_PROVIDER_SITE_OTHER): Admitting: Internal Medicine

## 2024-04-23 VITALS — BP 130/80 | HR 60 | Ht 64.5 in | Wt 159.0 lb

## 2024-04-23 DIAGNOSIS — I10 Essential (primary) hypertension: Secondary | ICD-10-CM

## 2024-04-24 ENCOUNTER — Other Ambulatory Visit: Payer: Self-pay | Admitting: Internal Medicine

## 2024-04-24 ENCOUNTER — Ambulatory Visit: Payer: Self-pay | Admitting: Internal Medicine

## 2024-04-24 DIAGNOSIS — E119 Type 2 diabetes mellitus without complications: Secondary | ICD-10-CM

## 2024-04-24 LAB — BASIC METABOLIC PANEL WITH GFR
BUN/Creatinine Ratio: 20 (calc) (ref 6–22)
BUN: 29 mg/dL — ABNORMAL HIGH (ref 7–25)
CO2: 27 mmol/L (ref 20–32)
Calcium: 9.5 mg/dL (ref 8.6–10.3)
Chloride: 100 mmol/L (ref 98–110)
Creat: 1.47 mg/dL — ABNORMAL HIGH (ref 0.70–1.28)
Glucose, Bld: 111 mg/dL — ABNORMAL HIGH (ref 65–99)
Potassium: 5 mmol/L (ref 3.5–5.3)
Sodium: 134 mmol/L — ABNORMAL LOW (ref 135–146)
eGFR: 51 mL/min/1.73m2 — ABNORMAL LOW (ref >=60)

## 2024-04-25 ENCOUNTER — Telehealth: Payer: Self-pay | Admitting: Internal Medicine

## 2024-04-25 ENCOUNTER — Encounter: Payer: Self-pay | Admitting: Internal Medicine

## 2024-04-25 NOTE — Telephone Encounter (Signed)
 I spoke with him by phone about elevated creatinine. He feels fine and says he was well hydrated when he came in. He is out of town but will come in Monday well hydrated and have this repeated.

## 2024-04-25 NOTE — Patient Instructions (Signed)
 Basic metabolic panel drawn on olmesartan .  Will contact patient with results. BP has improved on this medication.

## 2024-04-29 ENCOUNTER — Other Ambulatory Visit

## 2024-04-29 DIAGNOSIS — R799 Abnormal finding of blood chemistry, unspecified: Secondary | ICD-10-CM

## 2024-04-29 DIAGNOSIS — R748 Abnormal levels of other serum enzymes: Secondary | ICD-10-CM

## 2024-04-29 DIAGNOSIS — R7989 Other specified abnormal findings of blood chemistry: Secondary | ICD-10-CM

## 2024-04-29 LAB — BUN/CREATININE RATIO
BUN: 20 mg/dL (ref 7–25)
Creat: 1.16 mg/dL (ref 0.70–1.28)
eGFR: 68 mL/min/1.73m2 (ref >=60)

## 2024-04-30 ENCOUNTER — Ambulatory Visit: Payer: Self-pay | Admitting: Internal Medicine

## 2024-05-05 ENCOUNTER — Encounter: Payer: Self-pay | Admitting: Internal Medicine

## 2024-05-27 DIAGNOSIS — K080 Exfoliation of teeth due to systemic causes: Secondary | ICD-10-CM | POA: Diagnosis not present

## 2024-05-28 ENCOUNTER — Other Ambulatory Visit: Payer: Self-pay | Admitting: Cardiology

## 2024-06-04 ENCOUNTER — Encounter: Payer: Self-pay | Admitting: Internal Medicine

## 2024-06-05 ENCOUNTER — Ambulatory Visit: Admitting: Endocrinology

## 2024-06-05 ENCOUNTER — Encounter: Payer: Self-pay | Admitting: Endocrinology

## 2024-06-05 ENCOUNTER — Ambulatory Visit: Payer: Self-pay | Admitting: Endocrinology

## 2024-06-05 VITALS — BP 116/74 | HR 95 | Resp 16 | Ht 64.5 in | Wt 160.8 lb

## 2024-06-05 DIAGNOSIS — E119 Type 2 diabetes mellitus without complications: Secondary | ICD-10-CM | POA: Diagnosis not present

## 2024-06-05 DIAGNOSIS — Z7984 Long term (current) use of oral hypoglycemic drugs: Secondary | ICD-10-CM

## 2024-06-05 DIAGNOSIS — E785 Hyperlipidemia, unspecified: Secondary | ICD-10-CM | POA: Diagnosis not present

## 2024-06-05 LAB — POCT GLYCOSYLATED HEMOGLOBIN (HGB A1C): Hemoglobin A1C: 6.1 % — AB (ref 4.0–5.6)

## 2024-06-05 NOTE — Progress Notes (Signed)
 Outpatient Endocrinology Note Kerstyn Coryell, MD   Patient's Name: David Cantu    DOB: 05-Jul-1956    MRN: [id number]                                                    REASON OF VISIT: New consult for type 2 diabetes mellitus  REFERRING PROVIDER: Perri Ronal PARAS, MD  PCP: Perri Ronal PARAS, MD  HISTORY OF PRESENT ILLNESS:   David Cantu is a 68 y.o. old male with past medical history listed below, is here for new consult for type 2 diabetes mellitus.   Pertinent Diabetes History: Patient is referred to endocrinology for evaluation and management of type 2 diabetes mellitus, initial consult on June 05, 2024.  Patient was diagnosed with type 2 diabetes mellitus in July 2024.  He was prediabetic prior to diagnosis of type 2 diabetes mellitus.  Metformin  (regular ) was started in May 2022.  Later it was increased to metformin  (regular) 500 mg 2 times a day, however had nausea and GI upset and was stopped in April 2025 and started on Januvia  50 mg daily.    He has mostly controlled type 2 diabetes mellitus, highest hemoglobin A1c was 7.4% in January 2025, improved to 6.5% by March 2025.  History of DKA or diabetes related hospitalizations: none  Family h/o diabetes mellitus: multiple maternal side family member with type 2 DM.    He has personal history of pancreatitis + 2015 and 2017 and no family history of medullary thyroid  carcinoma or MEN 2B syndrome.  He has stable tiny cystic pancreatic lesion, being monitored.   Chronic Diabetes Complications : Retinopathy: no. Last ophthalmology exam was done on 09/2023, following with ophthalmology regularly.  Nephropathy: no, on ACE/ARB / Olmesartan  Peripheral neuropathy: no Coronary artery disease: no Stroke: no  Relevant comorbidities and cardiovascular risk factors: Obesity: no Body mass index is 27.17 kg/m.  Hypertension: Yes  Hyperlipidemia : Yes, on statin   Current / Home Diabetic regimen includes:  Januvia  50 mg daily.    Prior diabetic medications: Metformin  stopped due to GI intolerance.  Glycemic data:   One Touch glucometer, glucose log reviewed for last 1 week ,fasting blood sugar 121, 119, 116, 114, 102, 147.  Blood sugar in the afternoon 111, 124, blood sugar in the evening 110, 106, 102, 165.  Hypoglycemia: Patient has no hypoglycemic episodes. Patient has hypoglycemia awareness.  Factors modifying glucose control: 1.  Diabetic diet assessment: 3 meals a day.  Significant improvement on diet.  2.  Staying active or exercising: Regular gym 5 times a week.  3.  Medication compliance: compliant all of the time.  Interval history  Hemoglobin A1c 6.1% today.  Glucose data as reviewed above, mostly acceptable.  He has been liking Januvia  50 mg daily since April.  Denies any nausea stomach pain or GI issues.  No numbness and tingling in the feet.  No vision problem.  Patient complains of frequent urination, PSA was normal.  Initial visit today.  REVIEW OF SYSTEMS As per history of present illness.   PAST MEDICAL HISTORY: Past Medical History:  Diagnosis Date   Acute pancreatitis 02/03/2014; 05/12/2016   Arthritis    shoulders (05/12/2016)   CAD (coronary artery disease)    a. L&R cath 07/09/15 normal LV function, no MR or AS, normal R heart pressure,  No pulmonary HTN, 70% RCA s/p DES   Headache(784.0)    couple times/month (05/12/2016)   Heart murmur    Hypertension    Migraine    might have 5-6/year (05/12/2016)   Pneumonia 1960; ~ 1992   Pre-diabetes    Ringing in the ears    continuous ringing in my ears (05/12/2016)   Shingles     PAST SURGICAL HISTORY: Past Surgical History:  Procedure Laterality Date   CARDIAC CATHETERIZATION N/A 07/09/2015   Procedure: Right/Left Heart Cath and Coronary Angiography;  Surgeon: Oneil JAYSON Parchment, MD;  Location: [facility] INVASIVE CV LAB;  Service: Cardiovascular;  Laterality: N/A;   CARDIAC CATHETERIZATION N/A 07/09/2015   Procedure: Intravascular  Pressure Wire/FFR Study;  Surgeon: Lonni JONETTA Cash, MD;  Location: [facility] INVASIVE CV LAB;  Service: Cardiovascular;  Laterality: N/A;   CARDIAC CATHETERIZATION N/A 07/09/2015   Procedure: Coronary Stent Intervention;  Surgeon: Lonni JONETTA Cash, MD;  Location: [facility] INVASIVE CV LAB;  Service: Cardiovascular;  Laterality: N/A;  Distal RCA   SHOULDER ARTHROSCOPY W/ ROTATOR CUFF REPAIR Left 2011   TRACHEOSTOMY  1960   TRACHEOSTOMY CLOSURE  1960    ALLERGIES: Allergies  Allergen Reactions   Zenpep  [Pancrelipase  (Lip-Prot-Amyl)] Nausea And Vomiting    VIOLENT VOMITING    FAMILY HISTORY:  Family History  Problem Relation Age of Onset   Lung cancer Mother    Lung cancer Father    Cardiomyopathy Brother    Colon cancer Neg Hx     SOCIAL HISTORY: Social History   Socioeconomic History   Marital status: Married    Spouse name: Not on file   Number of children: 0   Years of education: Not on file   Highest education level: 12th grade  Occupational History   Occupation: Self employed  Tobacco Use   Smoking status: Never   Smokeless tobacco: Never  Vaping Use   Vaping status: Never Used  Substance and Sexual Activity   Alcohol use: Not Currently    Alcohol/week: 0.0 standard drinks of alcohol    Comment: 05/12/2016 might have a few drinks/month   Drug use: No   Sexual activity: Yes  Other Topics Concern   Not on file  Social History Narrative   Right handed. Drinks caffeine. Two story home. Moved to [place] from [place], [place]  circa 2007. Married w/ 3 adult children. Non-smoker, social alcohol consumption. 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 Walks up to 3 miles daily, goes to gym 5x/ week.    Social Drivers of Corporate Investment Banker Strain: Low Risk  (02/13/2024)   Overall Financial Resource Strain (CARDIA)    Difficulty of Paying Living Expenses: Not hard at all  Food Insecurity: No Food Insecurity (02/13/2024)   Hunger Vital Sign     Worried About Running Out of Food in the Last Year: Never true    Ran Out of Food in the Last Year: Never true  Transportation Needs: No Transportation Needs (02/13/2024)   PRAPARE - Administrator, Civil Service (Medical): No    Lack of Transportation (Non-Medical): No  Physical Activity: Sufficiently Active (02/13/2024)   Exercise Vital Sign    Days of Exercise per Week: 5 days    Minutes of Exercise per Session: 60 min  Stress: No Stress Concern Present (02/13/2024)   [employer] of Occupational Health - Occupational Stress Questionnaire    Feeling of Stress: Not at all  Social Connections: Moderately Integrated (02/13/2024)  Social Advertising Account Executive    Frequency of Communication with Friends and Family: Twice a week    Frequency of Social Gatherings with Friends and Family: Once a week    Attends Religious Services: 1 to 4 times per year    Active Member of [employer] or Organizations: No    Attends Banker Meetings: Never    Marital Status: Married    MEDICATIONS:  Current Outpatient Medications  Medication Sig Dispense Refill   acetaminophen  (TYLENOL ) 325 MG tablet Take 325-650 mg by mouth at bedtime as needed (for shoulder pain).      aspirin  81 MG chewable tablet Chew 1 tablet (81 mg total) by mouth daily. 30 tablet 11   Blood Glucose Monitoring Suppl (BLOOD GLUCOSE MONITOR& DTX APP) w/ Well Device KIT Check glucose twice a day dispense lancets and strips covered by insurance 1 kit 0   carvedilol  (COREG ) 6.25 MG tablet TAKE 1 TABLET(6.25 MG) BY MOUTH TWICE DAILY WITH A MEAL 180 tablet 2   ezetimibe  (ZETIA ) 10 MG tablet TAKE 1 TABLET(10 MG) BY MOUTH DAILY 90 tablet 3   glucose blood (ONETOUCH VERIO) test strip USE TO CHECK BLOOD GLUCOSE TWICE DAILY 100 strip 11   Lancets (ONETOUCH DELICA PLUS LANCET33G) MISC USE TO CHECK BLOOD GLUCOSE TWICE DAILY 100 each 11   nitroGLYCERIN  (NITROSTAT ) 0.4 MG SL tablet Place 1 tablet (0.4 mg total) under the  tongue every 5 (five) minutes as needed for chest pain. 25 tablet 3   olmesartan  (BENICAR ) 20 MG tablet Take 1 tablet (20 mg total) by mouth daily. 90 tablet 1   pantoprazole  (PROTONIX ) 40 MG tablet TAKE 1 TABLET(40 MG) BY MOUTH DAILY 90 tablet 2   rosuvastatin  (CRESTOR ) 40 MG tablet TAKE 1 TABLET(40 MG) BY MOUTH DAILY 90 tablet 3   sitaGLIPtin  (JANUVIA ) 50 MG tablet TAKE 1 TABLET(50 MG) BY MOUTH DAILY 90 tablet 1   No current facility-administered medications for this visit.    PHYSICAL EXAM: Vitals:   06/05/24 0811  BP: 116/74  Pulse: 95  Resp: 16  SpO2: 99%  Weight: 160 lb 12.8 oz (72.9 kg)  Height: 5' 4.5 (1.638 m)   Body mass index is 27.17 kg/m.  Wt Readings from Last 3 Encounters:  06/05/24 160 lb 12.8 oz (72.9 kg)  04/23/24 159 lb (72.1 kg)  03/12/24 158 lb (71.7 kg)    General: Well developed, well nourished male in no apparent distress.  HEENT: AT/[place], no external lesions.  Eyes: Conjunctiva clear and no icterus. Neck: Neck supple  Lungs: Respirations not labored Neurologic: Alert, oriented, normal speech Extremities / Skin: Dry.  Psychiatric: Does not appear depressed or anxious  Diabetic Foot Exam - Simple   No data filed    LABS Reviewed Lab Results  Component Value Date   HGBA1C 6.1 (A) 06/05/2024   HGBA1C 6.5 (H) 02/19/2024   HGBA1C 6.5 (H) 10/16/2023   No results found for: FRUCTOSAMINE Lab Results  Component Value Date   CHOL 97 02/19/2024   HDL 45 02/19/2024   LDLCALC 34 02/19/2024   TRIG 92 02/19/2024   CHOLHDL 2.2 02/19/2024   Lab Results  Component Value Date   MICRALBCREAT 3 02/19/2024   MICRALBCREAT 9 02/14/2023   Lab Results  Component Value Date   CREATININE 1.16 04/29/2024   No results found for: GFR  ASSESSMENT / PLAN  1. Controlled type 2 diabetes mellitus without complication, without long-term current use of insulin (HCC)     Diabetes Mellitus  type 2, complicated by no known complications. - Diabetic status /  severity: controlled.   Lab Results  Component Value Date   HGBA1C 6.1 (A) 06/05/2024    - Hemoglobin A1c goal : <6.5%  Discussed about type 2 diabetes mellitus and potential diabetic chronic complications including retinopathy, neuropathy and nephropathy.  He has controlled type 2 diabetes mellitus.  He has been on good diet and regular exercise.  Congratulated him and encouraged to stay on his lifestyle modification.  He has history of pancreatitis.  Discussed that Januvia  has potential side effect as pancreatitis and Januvia  should be used cautiously, he has improvement on diabetes control with no side effects, it is okay to continue on Januvia  for now.  SGLT2 inhibitors can be considered however he has problem with frequent urination, will hold off on it for now.  He had GI upset with metformin  regular however extended release metformin  in the future can be considered.  - Medications: See below for  I) continue Januvia  50 mg daily.  - Home glucose testing: Few times a week in the morning fasting and at bedtime.  - Discussed/ Gave Hypoglycemia treatment plan.  # Consult : not required at this time.   # Annual urine for microalbuminuria/ creatinine ratio, no microalbuminuria currently, continue ACE/ARB /olmesartan . Last  Lab Results  Component Value Date   MICRALBCREAT 3 02/19/2024    # Foot check nightly.  # Annual dilated diabetic eye exams.   - Diet: Eat reasonable portion sizes to promote a healthy weight - Life style / activity / exercise: discussed.   2. Blood pressure  -  BP Readings from Last 1 Encounters:  06/05/24 116/74    - Control is in target.  - No change in current plans.  3. Lipid status / Hyperlipidemia - Last  Lab Results  Component Value Date   LDLCALC 34 02/19/2024   - Continue rosuvastatin  40 mg daily.  Diagnoses and all orders for this visit:  Controlled type 2 diabetes mellitus without complication, without long-term current use of  insulin (HCC) -     POCT glycosylated hemoglobin (Hb A1C)    DISPOSITION Follow up in clinic in 4 months suggested.   All questions answered and patient verbalized understanding of the plan.  Maximiliano Cromartie, MD [facility] [address] [place], [address] [place], [place] [address] Phone # [phone]  At least part of this note was generated using voice recognition software. Inadvertent word errors may have occurred, which were not recognized during the proofreading process.

## 2024-07-22 ENCOUNTER — Ambulatory Visit: Payer: Self-pay

## 2024-07-22 NOTE — Telephone Encounter (Signed)
 FYI Only or Action Required?: FYI only for provider: appointment scheduled on 12/23.  Patient was last seen in primary care on 04/23/2024 by Perri Ronal PARAS, MD.  Called Nurse Triage reporting Cough.  Symptoms began a week ago.  Interventions attempted: OTC medications: Cold and Flu.  Symptoms are: gradually worsening.  Triage Disposition: See Physician Within 24 Hours  Patient/caregiver understands and will follow disposition?: Yes  Message from Zy'onna H sent at 07/22/2024  9:17 AM EST  Reason for Triage: Patient called in stating he has a cough Patient stated no mucus/congestion.  -The cough has been apparent for about 1 week - Explains it as a: Dry/persistent cough Looking to schedule with PCP be seen prior to the holidays.    Reason for Disposition  [1] Continuous (nonstop) coughing interferes with work or school AND [2] no improvement using cough treatment per Care Advice  Answer Assessment - Initial Assessment Questions Symptoms for the last week- Sinus congestion, post nasal drip, throat scratchy, dry hacky cough.  OTC cold and flu COVID test negative  Patient wants to ensure it is not bronchitis like last time. Denies Fever, SOB, CP, Dizziness. Appt with PCP tomorrow and waitlist if cancellations  1. ONSET: When did the cough begin?      One week  2. SEVERITY: How bad is the cough today?      moderate 3. SPUTUM: Describe the color of your sputum (e.g., none, dry cough; clear, white, yellow, green)     Yellow occasionally 4. HEMOPTYSIS: Are you coughing up any blood? If Yes, ask: How much? (e.g., flecks, streaks, tablespoons, etc.)     denies 5. DIFFICULTY BREATHING: Are you having difficulty breathing? If Yes, ask: How bad is it? (e.g., mild, moderate, severe)      denies 6. FEVER: Do you have a fever? If Yes, ask: What is your temperature, how was it measured, and when did it start?     denies 7. CARDIAC HISTORY: Do you have any history of  heart disease? (e.g., heart attack, congestive heart failure)      HTN  8. LUNG HISTORY: Do you have any history of lung disease?  (e.g., pulmonary embolus, asthma, emphysema)     Hx of bronchitis 9. PE RISK FACTORS: Do you have a history of blood clots? (or: recent major surgery, recent prolonged travel, bedridden)     denies 10. OTHER SYMPTOMS: Do you have any other symptoms? (e.g., runny nose, wheezing, chest pain)       Sinus congestion, drainage, scratchy throat  Protocols used: Cough - Acute Non-Productive-A-AH

## 2024-07-23 ENCOUNTER — Ambulatory Visit: Admitting: Internal Medicine

## 2024-07-23 ENCOUNTER — Encounter: Payer: Self-pay | Admitting: Internal Medicine

## 2024-07-23 VITALS — Ht 64.5 in | Wt 160.0 lb

## 2024-07-23 DIAGNOSIS — J22 Unspecified acute lower respiratory infection: Secondary | ICD-10-CM

## 2024-07-23 LAB — POC COVID19/FLU A&B COMBO
Covid Antigen, POC: NEGATIVE
Influenza A Antigen, POC: NEGATIVE
Influenza B Antigen, POC: NEGATIVE

## 2024-07-23 MED ORDER — AZITHROMYCIN 250 MG PO TABS: ORAL_TABLET | ORAL | 0 refills | Status: AC

## 2024-07-23 MED ORDER — HYDROCOD POLI-CHLORPHE POLI ER 10-8 MG/5ML PO SUER: 5.0000 mL | Freq: Two times a day (BID) | ORAL | 0 refills | Status: DC | PRN

## 2024-07-23 NOTE — Progress Notes (Signed)
 "   Patient Care Team: Perri Ronal PARAS, MD as PCP - General (Internal Medicine) Jeffrie Oneil BROCKS, MD as PCP - Cardiology (Cardiology)  Visit Date: 07/23/2024  Subjective:    Patient ID: David Cantu , Male   DOB: Jan 01, 1956, 68 y.o.    MRN: [id number]   68 y.o. Male presents today for Cough. Patient has a past medical history of Hypertension, hyperlipidemia, diabetes mellitus, type II.  He said that began to feel sick on Tuesday of last week. He is experiencing cough, congestion and a runny nose. Denies fever, chills or myalgias. He said that he was recently around his grandson who was sick. He took at an at home Covid-19 test and it was negative. 07/23/2024 Covid-19 and Influenza test were negative.   Vaccine counseling: UTD on Influenza and Covid-19 vaccine.  Past Medical History:  Diagnosis Date   Acute pancreatitis 02/03/2014; 05/12/2016   Arthritis    shoulders (05/12/2016)   CAD (coronary artery disease)    a. L&R cath 07/09/15 normal LV function, no MR or AS, normal R heart pressure, No pulmonary HTN, 70% RCA s/p DES   Headache(784.0)    couple times/month (05/12/2016)   Heart murmur    Hypertension    Migraine    might have 5-6/year (05/12/2016)   Pneumonia 1960; ~ 1992   Pre-diabetes    Ringing in the ears    continuous ringing in my ears (05/12/2016)   Shingles      Family History  Problem Relation Age of Onset   Lung cancer Mother    Lung cancer Father    Cardiomyopathy Brother    Colon cancer Neg Hx     Social History   Social History Narrative   Right handed. Drinks caffeine. Two story home. Moved to [place] from [place], [place]  circa 2007. Married w/ 3 adult children. Non-smoker, social alcohol consumption. 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 Walks up to 3 miles daily, goes to gym 5x/ week.       Review of Systems  Constitutional:  Negative for chills and fever.  HENT:  Positive for congestion.   Respiratory:  Positive for  cough.   Musculoskeletal:  Negative for myalgias.        Objective:   Vitals: Ht 5' 4.5 (1.638 m)   Wt 160 lb (72.6 kg)   BMI 27.04 kg/m    Physical Exam Vitals and nursing note reviewed.  Constitutional:      General: He is not in acute distress.    Appearance: Normal appearance. He is not ill-appearing.  HENT:     Head: Normocephalic and atraumatic.     Right Ear: Tympanic membrane, ear canal and external ear normal.     Left Ear: Tympanic membrane, ear canal and external ear normal.     Ears:     Comments: Cerumen in right ear.     Mouth/Throat:     Mouth: Mucous membranes are moist.     Pharynx: Oropharynx is clear. No oropharyngeal exudate or posterior oropharyngeal erythema.  Eyes:     Comments:    Pulmonary:     Effort: Pulmonary effort is normal.     Breath sounds: Normal breath sounds. No wheezing, rhonchi or rales.  Lymphadenopathy:     Cervical: No cervical adenopathy.  Skin:    General: Skin is warm and dry.  Neurological:     Mental Status: He is alert and oriented to person, place, and time. Mental status  is at baseline.  Psychiatric:        Mood and Affect: Mood normal.        Behavior: Behavior normal.        Thought Content: Thought content normal.        Judgment: Judgment normal.       Results:    Labs:       Component Value Date/Time   NA 134 (L) 04/23/2024 1057   K 5.0 04/23/2024 1057   CL 100 04/23/2024 1057   CO2 27 04/23/2024 1057   GLUCOSE 111 (H) 04/23/2024 1057   BUN 20 04/29/2024 0954   CREATININE 1.16 04/29/2024 0954   CALCIUM  9.5 04/23/2024 1057   PROT 7.1 02/19/2024 0908   ALBUMIN  4.3 09/19/2016 2042   AST 28 02/19/2024 0908   ALT 34 02/19/2024 0908   ALKPHOS 52 09/19/2016 2042   BILITOT 0.5 02/19/2024 0908   GFRNONAA 66 12/07/2020 1104   GFRAA 76 12/07/2020 1104     Lab Results  Component Value Date   WBC 6.7 02/19/2024   HGB 13.4 02/19/2024   HCT 42.2 02/19/2024   MCV 88.3 02/19/2024   PLT 201 02/19/2024     Lab Results  Component Value Date   CHOL 97 02/19/2024   HDL 45 02/19/2024   LDLCALC 34 02/19/2024   TRIG 92 02/19/2024   CHOLHDL 2.2 02/19/2024    Lab Results  Component Value Date   HGBA1C 6.1 (A) 06/05/2024      Lab Results  Component Value Date   PSA 0.60 02/19/2024   PSA 0.66 02/13/2023   PSA 0.66 02/08/2022       Assessment & Plan:   Orders Placed This Encounter  Procedures   POC Covid19/Flu A&B Antigen   Meds ordered this encounter  Medications   azithromycin  (ZITHROMAX ) 250 MG tablet    Sig: Take 2 tablets on day 1, then 1 tablet daily on days 2 through 5    Dispense:  6 tablet    Refill:  0   chlorpheniramine-HYDROcodone  (TUSSIONEX) 10-8 MG/5ML    Sig: Take 5 mLs by mouth every 12 (twelve) hours as needed for cough.    Dispense:  120 mL    Refill:  0   Acute lower respiratory infection: He said that began to feel sick on Tuesday of last week. He is experiencing cough, congestion and a runny nose. Denies fever, chills or myalgias. He said that he was recently around his grandson who was sick. He took at an at home Covid-19 test and it was negative. 07/23/2024 Covid-19 and Influenza test were negative.    Azithromycin  250 mg two tablets on day one, one tablet daily on day 2-5 prescribed.    Tussionex 5 mL every 12 hours as needed for cough prescribed.   Vaccine counseling: UTD on Influenza and Covid-19 vaccine.  Rest and stay well-hydrated.  Call if not better in 5 to 7 days or sooner if worse.  I,Makayla C Reid,acting as a scribe for Ronal JINNY Hailstone, MD.,have documented all relevant documentation on the behalf of Ronal JINNY Hailstone, MD,as directed by  Ronal JINNY Hailstone, MD while in the presence of Ronal JINNY Hailstone, MD.      "

## 2024-07-27 ENCOUNTER — Encounter: Payer: Self-pay | Admitting: Internal Medicine

## 2024-07-27 NOTE — Patient Instructions (Signed)
 You have been diagnosed with an acute lower respiratory infection.  Please take Zithromax  Z-PAK 2 tablets day 1 followed by 1 tablet days 2 through 5.  COVID-19 and flu test today are negative.  May take Tussionex 1 teaspoon every 12 hours as needed for cough.  Rest and stay well-hydrated.  Call if not better in 5 to 7 days or sooner if worse.

## 2024-08-14 ENCOUNTER — Encounter: Payer: Self-pay | Admitting: Internal Medicine

## 2024-08-14 LAB — HM DIABETES EYE EXAM

## 2024-08-15 ENCOUNTER — Encounter: Payer: Self-pay | Admitting: Internal Medicine

## 2024-08-15 NOTE — Progress Notes (Addendum)
 "   Patient Care Team: David David PARAS, MD as PCP - General (Internal Medicine) David Oneil BROCKS, MD as PCP - Cardiology (Cardiology)  Visit Date: 08/29/24  Subjective:    Patient ID: David Cantu , Male   DOB: [birth year], 69 y.o.    MRN: [id number]   69 y.o. Male presents today for  6 month follow up for Hypertension and Diabetes Mellitus, Type II. Patient has  Mixed Hyperlipidemia and GE Reflux. Hx of sciatica.  He complains that his fourth finger on the left hand will lock up at the PIP joint. This appears to be a trigger finger and he will be referred to [facility].   History of Hypertension treated with Carvedilol  6.25 mg twice daily, Olmesartan  20 mg daily. Blood pressure today is elevated at 160/82.  He has been monitoring his blood pressure at home the highest was 177/86 and the lowest was 119/65.  History of Mixed  Hyperlipidemia treated with Zetia  10 mg daily and Rosuvastatin  40 mg daily.  08/27/2024 Lipid panel normal.   History of Diabetes Mellitus, type II treated with Januvia  50 mg daily. 08/27/2024 HgbA1c 6.3% compared to Hgb AIC of  6.1%  when checked 2 months ago. He said that his highest blood glucose was 162 and the lowest was 99. He has an appointment with his Endocrinologist in March.   History of Recurrent Pancreatitis, initial episode in 2015 was thought to possibly be related to hypertriglyceridemia and last episode was in  09/2016.  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 Denies further recurrences since 2018.    History of Sciatica; Followed by Orthopedist, Dr. Barton for Right Achilles Tendinitis, Right Plantar Fascitis, Plantar Fascial Fibromatosis.   History of GE Reflux treated with Protonix  40 mg daily. This was diagnosed around the time of his initial episode of pancreatitis.      Past  Medical History:  Diagnosis Date   Acute pancreatitis 02/03/2014; 05/12/2016   Arthritis    shoulders (05/12/2016)   CAD (coronary artery disease)    a. L&R cath 07/09/15 normal LV function, no MR or AS, normal R heart pressure, No pulmonary HTN, 70% RCA s/p DES   Cataract    Diabetes mellitus without complication (HCC)    Headache(784.0)    couple times/month (05/12/2016)   Heart murmur    Hyperlipidemia    Hypertension    Migraine    might have 5-6/year (05/12/2016)   Pneumonia 1960; ~ 1992   Pre-diabetes    Ringing in the ears    continuous ringing in my ears (05/12/2016)   Shingles      Family History  Problem Relation Age of Onset   Lung cancer Mother    Lung cancer Father    Cardiomyopathy Brother    Colon cancer Neg Hx    Colon polyps Neg Hx    Esophageal cancer Neg Hx    Stomach cancer Neg Hx    Rectal cancer Neg Hx     Social History   Social History Narrative   Right handed. Drinks caffeine. Two story home. Moved to [place] from [place], [place]  circa 2007. Married w/ 3 adult children. Non-smoker, social alcohol consumption. 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 Walks up to 3 miles daily, goes to gym 5x/ week.       Review of Systems  Musculoskeletal:  Positive for joint pain.        Objective:   Vitals: BP (!) 160/82   Pulse 60   Ht 5' 4.5 (1.638 m)   Wt 165 lb (74.8 kg)   SpO2 97%   BMI 27.88 kg/m    Physical Exam Vitals and nursing note reviewed.    Chest clear. Cor:RRR. No LE edema. Has left fourth trigger finger PIP joint   Results:   Studies obtained and personally reviewed by me:  Labs:       Component Value Date/Time   NA 134 (L) 04/23/2024 1057   K 5.0 04/23/2024 1057   CL 100 04/23/2024 1057   CO2 27 04/23/2024 1057   GLUCOSE 111 (H) 04/23/2024 1057   BUN 20 04/29/2024 0954   CREATININE 1.16 04/29/2024 0954   CALCIUM  9.5 04/23/2024 1057   PROT 7.4 08/27/2024 1013   ALBUMIN  4.3 09/19/2016  2042   AST 35 08/27/2024 1013   ALT 52 (H) 08/27/2024 1013   ALKPHOS 52 09/19/2016 2042   BILITOT 0.8 08/27/2024 1013   GFRNONAA 66 12/07/2020 1104   GFRAA 76 12/07/2020 1104     Lab Results  Component Value Date   WBC 6.7 02/19/2024   HGB 13.4 02/19/2024   HCT 42.2 02/19/2024   MCV 88.3 02/19/2024   PLT 201 02/19/2024    Lab Results  Component Value Date   CHOL 109 08/27/2024   HDL 47 08/27/2024   LDLCALC 41 08/27/2024   TRIG 129 08/27/2024   CHOLHDL 2.3 08/27/2024    Lab Results  Component Value Date   HGBA1C 6.3 (H) 08/27/2024      Lab Results  Component Value Date   PSA 0.60 02/19/2024   PSA 0.66 02/13/2023   PSA 0.66 02/08/2022   Assessment & Plan:   Meds ordered this encounter  Medications   carvedilol  (COREG ) 12.5 MG tablet    Sig: Take 1 tablet (12.5 mg total) by mouth 2 (two) times daily with a meal.    Dispense:  180 tablet    Refill:  1   Orders Placed This Encounter  Procedures   Ambulatory referral to Hand Surgery    Referral Priority:   Routine    Referral Type:   Surgical    Referral Reason:   Specialty Services Required    Referred to Provider:   Camella Fallow, MD    Requested Specialty:   Hand Surgery    Number of Visits Requested:   1    Trigger finger of left hand: He complains that his fourth finger on the left hand will lock up at the PIP joint.    Referred to Hand surgeon, Dr. Camella, at [facility]   Hypertension: treated with Carvedilol  6.25 mg twice daily, Olmesartan  20 mg daily. Blood pressure today is elevated at 160/82.  He has been monitoring his blood pressure at home the highest was 177/86 and the lowest was 119/65  Carvedilol  increased to 12.5 mg twice daily.  -continue to monitor Bp at home and let me know if not improving in 4 weeks.  Hyperlipidemia: treated with Zetia  10 mg daily and Rosuvastatin  40 mg daily.  08/27/2024 Lipid panel normal.   Diabetes Mellitus, type II: treated with Januvia  50 mg daily.  08/27/2024 HgbA1c 6.3% compared with 6.1% 2 months ago. He said that his highest blood glucose was 162 and the lowest was 99. He has an appointment with his Endocrinologist in March.    Hx of Recurrent  Pancreatitis, initial episode in 2015 was thought to be related to hypertriglyceridemia and last episode  was in 09/2016. 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 Denies further recurrences since 2018.    Followed by Orthopedist, Dr. Barton for hx Right Achilles Tendinitis, Right Plantar Fascitis, Plantar Fascial Fibromatosis.   GE Reflux: treated with Protonix  40 mg daily. This was diagnosed around the time of his initial episode of pancreatitis.     Plan:He will let me know if BP not responding to increased dose of Carvedilol . Due for CPE and Medicare Wellness exam in 6 months.   See Dr. Camella about trigger finger. Continue Cardiology follow up and Endocrine follow up.  I,David Cantu,acting as a scribe for David JINNY Hailstone, MD.,have documented all relevant documentation on the behalf of David JINNY Hailstone, MD,as directed by  David JINNY Hailstone, MD while in the presence of David JINNY Hailstone, MD.    I, David JINNY Hailstone, MD, have reviewed all documentation for this visit. The documentation on 08/29/2024 for the exam, diagnosis, procedures, and orders are all accurate and complete.  "

## 2024-08-21 ENCOUNTER — Ambulatory Visit

## 2024-08-21 VITALS — Ht 64.5 in | Wt 160.0 lb

## 2024-08-21 DIAGNOSIS — Z1211 Encounter for screening for malignant neoplasm of colon: Secondary | ICD-10-CM

## 2024-08-21 MED ORDER — NA SULFATE-K SULFATE-MG SULF 17.5-3.13-1.6 GM/177ML PO SOLN: 1.0000 | Freq: Once | ORAL | 0 refills | Status: AC

## 2024-08-21 NOTE — Progress Notes (Signed)
 Pt's name and DOB verified at the beginning of the pre-visit with 2 identifiers  Pt denies any difficulty with ambulating,sitting, laying down or rolling side to side  Pt has no issues moving head neck or swallowing  No egg or soy allergy known to patient   No issues known to pt with past sedation  No FH of Malignant Hyperthermia  Pt is not on home 02   Pt is not on blood thinners   Pt denies issues with constipation   Pt is not on dialysis  Pt denise any abnormal heart rhythms but does have heart murmur  Pt denies any upcoming cardiac testing   Chart not reviewed by CRNA prior to PV  Visit by phone  Pt states weight is 160 lb    Pt given  both [facility] main # and MD on call # prior to instructions.  Informed pt to come in at the time discussed and is shown on PV instructions.  Pt instructed to use Singlecare.com or GoodRx for a price reduction on prep  Instructed pt where to find PV instructions in My Chart. Pt able to access while on call Instructed pt on all aspects of written instructions including med holds clothing to wear and foods to eat and not eat as well as after procedure legal restrictions and to call MD on call if needed.. Pt states understanding. Instructed pt to review instructions again prior to procedure and call main # given if has any questions or any issues. Pt states they will.

## 2024-08-26 ENCOUNTER — Encounter: Payer: Self-pay | Admitting: Internal Medicine

## 2024-08-27 ENCOUNTER — Other Ambulatory Visit: Payer: Self-pay

## 2024-08-27 DIAGNOSIS — E786 Lipoprotein deficiency: Secondary | ICD-10-CM

## 2024-08-27 DIAGNOSIS — I10 Essential (primary) hypertension: Secondary | ICD-10-CM

## 2024-08-27 DIAGNOSIS — E119 Type 2 diabetes mellitus without complications: Secondary | ICD-10-CM

## 2024-08-28 LAB — HEPATIC FUNCTION PANEL
AG Ratio: 1.7 (calc) (ref 1.0–2.5)
ALT: 52 U/L — ABNORMAL HIGH (ref 9–46)
AST: 35 U/L (ref 10–35)
Albumin: 4.7 g/dL (ref 3.6–5.1)
Alkaline phosphatase (APISO): 46 U/L (ref 35–144)
Bilirubin, Direct: 0.2 mg/dL (ref 0.0–0.2)
Globulin: 2.7 g/dL (ref 1.9–3.7)
Indirect Bilirubin: 0.6 mg/dL (ref 0.2–1.2)
Total Bilirubin: 0.8 mg/dL (ref 0.2–1.2)
Total Protein: 7.4 g/dL (ref 6.1–8.1)

## 2024-08-28 LAB — LIPID PANEL
Cholesterol: 109 mg/dL
HDL: 47 mg/dL
LDL Cholesterol (Calc): 41 mg/dL
Non-HDL Cholesterol (Calc): 62 mg/dL
Total CHOL/HDL Ratio: 2.3 (calc)
Triglycerides: 129 mg/dL

## 2024-08-28 LAB — HEMOGLOBIN A1C
Hgb A1c MFr Bld: 6.3 % — ABNORMAL HIGH
Mean Plasma Glucose: 134 mg/dL
eAG (mmol/L): 7.4 mmol/L

## 2024-08-28 LAB — MICROALBUMIN / CREATININE URINE RATIO
Creatinine, Urine: 71 mg/dL (ref 20–320)
Microalb Creat Ratio: 6 mg/g{creat}
Microalb, Ur: 0.4 mg/dL

## 2024-08-29 ENCOUNTER — Ambulatory Visit: Payer: Self-pay | Admitting: Internal Medicine

## 2024-08-29 ENCOUNTER — Encounter: Payer: Self-pay | Admitting: Internal Medicine

## 2024-08-29 VITALS — BP 160/82 | HR 60 | Ht 64.5 in | Wt 165.0 lb

## 2024-08-29 DIAGNOSIS — E119 Type 2 diabetes mellitus without complications: Secondary | ICD-10-CM | POA: Diagnosis not present

## 2024-08-29 DIAGNOSIS — M65342 Trigger finger, left ring finger: Secondary | ICD-10-CM

## 2024-08-29 DIAGNOSIS — Z8719 Personal history of other diseases of the digestive system: Secondary | ICD-10-CM | POA: Diagnosis not present

## 2024-08-29 DIAGNOSIS — Z7984 Long term (current) use of oral hypoglycemic drugs: Secondary | ICD-10-CM | POA: Diagnosis not present

## 2024-08-29 DIAGNOSIS — I10 Essential (primary) hypertension: Secondary | ICD-10-CM | POA: Diagnosis not present

## 2024-08-29 DIAGNOSIS — K219 Gastro-esophageal reflux disease without esophagitis: Secondary | ICD-10-CM | POA: Diagnosis not present

## 2024-08-29 DIAGNOSIS — E785 Hyperlipidemia, unspecified: Secondary | ICD-10-CM

## 2024-08-29 DIAGNOSIS — M6530 Trigger finger, unspecified finger: Secondary | ICD-10-CM

## 2024-08-29 DIAGNOSIS — E782 Mixed hyperlipidemia: Secondary | ICD-10-CM

## 2024-08-29 MED ORDER — CARVEDILOL 12.5 MG PO TABS: 12.5000 mg | ORAL_TABLET | Freq: Two times a day (BID) | ORAL | 1 refills | Status: AC

## 2024-08-29 NOTE — Patient Instructions (Addendum)
 He will monitor BP on increased dose of  Carvedilol  12.5 mg daily. Lipid panel is normal on current meds.Has trigger finger left fourth finger. Referral placed to [facility], Dr Camella. Continue to watch diet and exercise. Continue Januvia .

## 2024-09-04 NOTE — Progress Notes (Unsigned)
 [facility] History and Physical   Primary Care Physician:  Perri Ronal PARAS, MD   Reason for Procedure:    Encounter Diagnosis  Name Primary?   Colon cancer screening Yes     Plan:    colonoscopy   The patient was provided an opportunity to ask questions and all were answered. The patient agreed with the plan.   HPI: David Cantu is a 69 y.o. male here for a screening colonoscopy. Last one oin 2016 was normal. Also has pancreatic cysts  suspect side branch IPMN's- last MR 2023.   Past Medical History:  Diagnosis Date   Acute pancreatitis 02/03/2014; 05/12/2016   Arthritis    shoulders (05/12/2016)   CAD (coronary artery disease)    a. L&R cath 07/09/15 normal LV function, no MR or AS, normal R heart pressure, No pulmonary HTN, 70% RCA s/p DES   Cataract    Diabetes mellitus without complication (HCC)    Headache(784.0)    couple times/month (05/12/2016)   Heart murmur    Hyperlipidemia    Hypertension    Migraine    might have 5-6/year (05/12/2016)   Pneumonia 1960; ~ 1992   Pre-diabetes    Ringing in the ears    continuous ringing in my ears (05/12/2016)   Shingles     Past Surgical History:  Procedure Laterality Date   CARDIAC CATHETERIZATION N/A 07/09/2015   Procedure: Right/Left Heart Cath and Coronary Angiography;  Surgeon: Oneil JAYSON Parchment, MD;  Location: [facility] INVASIVE CV LAB;  Service: Cardiovascular;  Laterality: N/A;   CARDIAC CATHETERIZATION N/A 07/09/2015   Procedure: Intravascular Pressure Wire/FFR Study;  Surgeon: Lonni JONETTA Cash, MD;  Location: [facility] INVASIVE CV LAB;  Service: Cardiovascular;  Laterality: N/A;   CARDIAC CATHETERIZATION N/A 07/09/2015   Procedure: Coronary Stent Intervention;  Surgeon: Lonni JONETTA Cash, MD;  Location: [facility] INVASIVE CV LAB;  Service: Cardiovascular;  Laterality: N/A;  Distal RCA   COLONOSCOPY     SHOULDER ARTHROSCOPY W/ ROTATOR CUFF REPAIR Left 2011   TRACHEOSTOMY  1960   TRACHEOSTOMY  CLOSURE  1960     Current Outpatient Medications  Medication Sig Dispense Refill   aspirin  81 MG chewable tablet Chew 1 tablet (81 mg total) by mouth daily. 30 tablet 11   Blood Glucose Monitoring Suppl (BLOOD GLUCOSE MONITOR& DTX APP) w/ Well Device KIT Check glucose twice a day dispense lancets and strips covered by insurance 1 kit 0   carvedilol  (COREG ) 12.5 MG tablet Take 1 tablet (12.5 mg total) by mouth 2 (two) times daily with a meal. 180 tablet 1   ezetimibe  (ZETIA ) 10 MG tablet TAKE 1 TABLET(10 MG) BY MOUTH DAILY 90 tablet 3   glucose blood (ONETOUCH VERIO) test strip USE TO CHECK BLOOD GLUCOSE TWICE DAILY 100 strip 11   Lancets (ONETOUCH DELICA PLUS LANCET33G) MISC USE TO CHECK BLOOD GLUCOSE TWICE DAILY 100 each 11   olmesartan  (BENICAR ) 20 MG tablet Take 1 tablet (20 mg total) by mouth daily. 90 tablet 1   pantoprazole  (PROTONIX ) 40 MG tablet TAKE 1 TABLET(40 MG) BY MOUTH DAILY 90 tablet 2   rosuvastatin  (CRESTOR ) 40 MG tablet TAKE 1 TABLET(40 MG) BY MOUTH DAILY 90 tablet 3   acetaminophen  (TYLENOL ) 325 MG tablet Take 325-650 mg by mouth at bedtime as needed (for shoulder pain).      nitroGLYCERIN  (NITROSTAT ) 0.4 MG SL tablet Place 1 tablet (0.4 mg total) under the tongue every 5 (five) minutes as needed for chest pain. 25 tablet 3  Current Facility-Administered Medications  Medication Dose Route Frequency Provider Last Rate Last Admin   0.9 %  sodium chloride  infusion  500 mL Intravenous Once Avram Lupita BRAVO, MD        Allergies as of 09/05/2024 - Review Complete 09/05/2024  Allergen Reaction Noted   Zenpep  [pancrelipase  (lip-prot-amyl)] Nausea And Vomiting 07/04/2016    Family History  Problem Relation Age of Onset   Lung cancer Mother    Lung cancer Father    Cardiomyopathy Brother    Colon cancer Neg Hx    Colon polyps Neg Hx    Esophageal cancer Neg Hx    Stomach cancer Neg Hx    Rectal cancer Neg Hx     Social History   Socioeconomic History   Marital  status: Married    Spouse name: Not on file   Number of children: 0   Years of education: Not on file   Highest education level: 12th grade  Occupational History   Occupation: Self employed  Tobacco Use   Smoking status: Never   Smokeless tobacco: Never  Vaping Use   Vaping status: Never Used  Substance and Sexual Activity   Alcohol use: Not Currently    Alcohol/week: 0.0 standard drinks of alcohol    Comment: 05/12/2016 might have a few drinks/month   Drug use: No   Sexual activity: Yes  Other Topics Concern   Not on file  Social History Narrative   Right handed. Drinks caffeine. Two story home. Moved to [place] from [place], [place]  circa 2007. Married w/ 3 adult children. Non-smoker, social alcohol consumption. 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 Walks up to 3 miles daily, goes to gym 5x/ week.    Social Drivers of Health   Tobacco Use: Low Risk (09/05/2024)   Patient History    Smoking Tobacco Use: Never    Smokeless Tobacco Use: Never    Passive Exposure: Not on file  Financial Resource Strain: Low Risk (07/22/2024)   Overall Financial Resource Strain (CARDIA)    Difficulty of Paying Living Expenses: Not hard at all  Food Insecurity: No Food Insecurity (07/22/2024)   Epic    Worried About Radiation Protection Practitioner of Food in the Last Year: Never true    Ran Out of Food in the Last Year: Never true  Transportation Needs: No Transportation Needs (07/22/2024)   Epic    Lack of Transportation (Medical): No    Lack of Transportation (Non-Medical): No  Physical Activity: Sufficiently Active (07/22/2024)   Exercise Vital Sign    Days of Exercise per Week: 5 days    Minutes of Exercise per Session: 60 min  Stress: No Stress Concern Present (07/22/2024)   [employer] of Occupational Health - Occupational Stress Questionnaire    Feeling of Stress: Not at all  Social Connections: Moderately Integrated (07/22/2024)   Social Connection and Isolation Panel     Frequency of Communication with Friends and Family: Twice a week    Frequency of Social Gatherings with Friends and Family: Once a week    Attends Religious Services: 1 to 4 times per year    Active Member of [employer] or Organizations: No    Attends Banker Meetings: Not on file    Marital Status: Married  Intimate Partner Violence: Not At Risk (02/13/2024)   Epic    Fear of Current or Ex-Partner: No    Emotionally Abused: No    Physically Abused: No    Sexually  Abused: No  Depression (PHQ2-9): Low Risk (08/29/2024)   Depression (PHQ2-9)    PHQ-2 Score: 0  Alcohol Screen: Low Risk (07/22/2024)   Alcohol Screen    Last Alcohol Screening Score (AUDIT): 1  Housing: Low Risk (07/22/2024)   Epic    Unable to Pay for Housing in the Last Year: No    Number of Times Moved in the Last Year: 0    Homeless in the Last Year: No  Utilities: Not At Risk (02/13/2024)   Epic    Threatened with loss of utilities: No  Health Literacy: Adequate Health Literacy (02/13/2024)   B1300 Health Literacy    Frequency of need for help with medical instructions: Never    Review of Systems:  All other review of systems negative except as mentioned in the HPI.  Physical Exam: Vital signs BP (!) 148/82   Pulse 62   Temp 97.7 F (36.5 C) (Skin)   Resp 14   Ht 5' 4.5 (1.638 m)   Wt 160 lb (72.6 kg)   SpO2 97%   BMI 27.04 kg/m   General:   Alert,  Well-developed, well-nourished, pleasant and cooperative in NAD Lungs:  Clear throughout to auscultation.   Heart:  Regular rate and rhythm; no murmurs, clicks, rubs,  or gallops. Abdomen:  Soft, nontender and nondistended. Normal bowel sounds.   Neuro/Psych:  Alert and cooperative. Normal mood and affect. A and O x 3   @Fernie Grimm  [name], MD, [facility] [phone] (pager) 09/05/2024 8:49 AM@

## 2024-09-05 ENCOUNTER — Ambulatory Visit: Admitting: Internal Medicine

## 2024-09-05 ENCOUNTER — Encounter: Payer: Self-pay | Admitting: Internal Medicine

## 2024-09-05 VITALS — BP 105/54 | HR 64 | Temp 97.7°F | Resp 17 | Ht 64.5 in | Wt 160.0 lb

## 2024-09-05 DIAGNOSIS — K648 Other hemorrhoids: Secondary | ICD-10-CM

## 2024-09-05 DIAGNOSIS — Z1211 Encounter for screening for malignant neoplasm of colon: Secondary | ICD-10-CM

## 2024-09-05 DIAGNOSIS — K5730 Diverticulosis of large intestine without perforation or abscess without bleeding: Secondary | ICD-10-CM

## 2024-09-05 MED ORDER — SODIUM CHLORIDE 0.9 % IV SOLN: 500.0000 mL | Freq: Once | INTRAVENOUS | Status: DC

## 2024-09-05 NOTE — Progress Notes (Signed)
 VS by KP  Pt's states no medical or surgical changes since previsit or office visit.

## 2024-09-05 NOTE — Progress Notes (Signed)
 Report given to PACU, vss

## 2024-09-05 NOTE — Op Note (Signed)
 [facility] Patient Name: David Cantu Procedure Date: 09/05/2024 8:50 AM MRN: [id number] Endoscopist: Lupita FORBES Commander , MD, [id number] Age: 69 Referring MD:  Date of Birth: 1956/02/14 Gender: Male Account #: [id number] Procedure:                Colonoscopy Indications:              Screening for colorectal malignant neoplasm, Last                            colonoscopy: January 2016 Medicines:                Monitored Anesthesia Care Procedure:                Pre-Anesthesia Assessment:                           - Prior to the procedure, a History and Physical                            was performed, and patient medications and                            allergies were reviewed. The patient's tolerance of                            previous anesthesia was also reviewed. The risks                            and benefits of the procedure and the sedation                            options and risks were discussed with the patient.                            All questions were answered, and informed consent                            was obtained. Prior Anticoagulants: The patient has                            taken no anticoagulant or antiplatelet agents. ASA                            Grade Assessment: III - A patient with severe                            systemic disease. After reviewing the risks and                            benefits, the patient was deemed in satisfactory                            condition to undergo the procedure.  After obtaining informed consent, the colonoscope                            was passed under direct vision. Throughout the                            procedure, the patient's blood pressure, pulse, and                            oxygen saturations were monitored continuously. The                            CF [id number] #[id number] was introduced through the anus                            and advanced to the the  cecum, identified by                            appendiceal orifice and ileocecal valve. The                            colonoscopy was performed without difficulty. The                            patient tolerated the procedure well. The quality                            of the bowel preparation was excellent. The                            ileocecal valve, appendiceal orifice, and rectum                            were photographed. The bowel preparation used was                            SUPREP via split dose instruction. Scope In: 8:57:20 AM Scope Out: 9:05:23 AM Scope Withdrawal Time: 0 hours 6 minutes 27 seconds  Total Procedure Duration: 0 hours 8 minutes 3 seconds  Findings:                 The perianal and digital rectal examinations were                            normal.                           Multiple small-mouthed diverticula were found in                            the sigmoid colon.                           Internal hemorrhoids were found.  The exam was otherwise without abnormality on                            direct and retroflexion views. Complications:            No immediate complications. Estimated Blood Loss:     Estimated blood loss: none. Impression:               - Diverticulosis in the sigmoid colon.                           - Internal hemorrhoids.                           - The examination was otherwise normal on direct                            and retroflexion views.                           - No specimens collected. Recommendation:           - Patient has a contact number available for                            emergencies. The signs and symptoms of potential                            delayed complications were discussed with the                            patient. Return to normal activities tomorrow.                            Written discharge instructions were provided to the                            patient.                            - Resume previous diet.                           - Continue present medications.                           - No repeat colonoscopy due to current age (69                            years or older) and the absence of colonic polyps.                           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

## 2024-09-05 NOTE — Patient Instructions (Addendum)
 No polyps or cancer seen, again! You do not need further routine repeat colonoscopy or other colon cancer screening testing.  You do have some diverticulosis which are pouches and pockets in the colon and some swollen hemorrhoids which we all have.  Please read the handouts.  I will have my office staff arrange for you to have an MRI of the abdomen to recheck the pancreas cysts, as we discussed.  I appreciate the opportunity to care for you. David Cantu [name], MD, [facility]   Resume previous diet. Continue present medications. No repeat colonoscopy due to current age (57 years or older) and the absence of colonic polyps. Handouts provided on hemorrhoids and diverticulosis.  YOU HAD AN ENDOSCOPIC PROCEDURE TODAY AT THE [facility]:   Refer to the procedure report that was given to you for any specific questions about what was found during the examination.  If the procedure report does not answer your questions, please call your gastroenterologist to clarify.  If you requested that your care partner not be given the details of your procedure findings, then the procedure report has been included in a sealed envelope for you to review at your convenience later.  YOU SHOULD EXPECT: Some feelings of bloating in the abdomen. Passage of more gas than usual.  Walking can help get rid of the air that was put into your GI tract during the procedure and reduce the bloating. If you had a lower endoscopy (such as a colonoscopy or flexible sigmoidoscopy) you may notice spotting of blood in your stool or on the toilet paper. If you underwent a bowel prep for your procedure, you may not have a normal bowel movement for a few days.  Please Note:  You might notice some irritation and congestion in your nose or some drainage.  This is from the oxygen used during your procedure.  There is no need for concern and it should clear up in a day or so.  SYMPTOMS TO REPORT IMMEDIATELY:  Following lower endoscopy  (colonoscopy or flexible sigmoidoscopy):  Excessive amounts of blood in the stool  Significant tenderness or worsening of abdominal pains  Swelling of the abdomen that is new, acute  Fever of 100F or higher  For urgent or emergent issues, a gastroenterologist can be reached at any hour by calling [phone]. Do not use MyChart messaging for urgent concerns.    DIET:  We do recommend a small meal at first, but then you may proceed to your regular diet.  Drink plenty of fluids but you should avoid alcoholic beverages for 24 hours.  ACTIVITY:  You should plan to take it easy for the rest of today and you should NOT DRIVE or use heavy machinery until tomorrow (because of the sedation medicines used during the test).    FOLLOW UP: Our staff will call the number listed on your records the next business day following your procedure.  We will call around 7:15- 8:00 am to check on you and address any questions or concerns that you may have regarding the information given to you following your procedure. If we do not reach you, we will leave a message.     If any biopsies were taken you will be contacted by phone or by letter within the next 1-3 weeks.  Please call us  at [phone] if you have not heard about the biopsies in 3 weeks.    SIGNATURES/CONFIDENTIALITY: You and/or your care partner have signed paperwork which will be entered into  your electronic medical record.  These signatures attest to the fact that that the information above on your After Visit Summary has been reviewed and is understood.  Full responsibility of the confidentiality of this discharge information lies with you and/or your care-partner.

## 2024-09-06 ENCOUNTER — Telehealth: Payer: Self-pay

## 2024-09-06 DIAGNOSIS — K862 Cyst of pancreas: Secondary | ICD-10-CM

## 2024-09-06 NOTE — Telephone Encounter (Signed)
 I spoke to David Cantu about setting up his MRI abdomen w/wo contrast that Dr Avram ordered post colonoscopy that was done 09/05/2024. Dx : cyst of pancreatis   After getting his best days/times this has been set up for 09/25/2024 at [facility] location, NPO 4 hours, arrive at 8:30am for a 9:00am MRI.    He is active in [place]. I have left him a detailed voice mail message with the appointment details and the # to r/s if needed...[phone].

## 2024-09-06 NOTE — Telephone Encounter (Signed)
" °  Follow up Call-     09/05/2024    7:42 AM  Call back number  Post procedure Call Back phone  # [phone]  Permission to leave phone message Yes     Patient questions:  Do you have a fever, pain , or abdominal swelling? No. Pain Score  0 *  Have you tolerated food without any problems? Yes.    Have you been able to return to your normal activities? Yes.    Do you have any questions about your discharge instructions: Diet   No. Medications  No. Follow up visit  No.  Do you have questions or concerns about your Care? No.  Actions: * If pain score is 4 or above: No action needed, pain <4.   "

## 2024-09-25 ENCOUNTER — Ambulatory Visit (HOSPITAL_BASED_OUTPATIENT_CLINIC_OR_DEPARTMENT_OTHER)

## 2024-10-04 ENCOUNTER — Ambulatory Visit: Admitting: Endocrinology
